# Patient Record
Sex: Male | Born: 1950
Health system: Southern US, Community
[De-identification: ages and names within clinical notes are randomized; demographics above are authoritative.]

## PROBLEM LIST (undated history)

## (undated) DIAGNOSIS — R001 Bradycardia, unspecified: Secondary | ICD-10-CM

## (undated) DIAGNOSIS — M1990 Unspecified osteoarthritis, unspecified site: Secondary | ICD-10-CM

## (undated) DIAGNOSIS — I2510 Atherosclerotic heart disease of native coronary artery without angina pectoris: Secondary | ICD-10-CM

## (undated) DIAGNOSIS — I10 Essential (primary) hypertension: Secondary | ICD-10-CM

## (undated) DIAGNOSIS — I440 Atrioventricular block, first degree: Secondary | ICD-10-CM

## (undated) DIAGNOSIS — I219 Acute myocardial infarction, unspecified: Secondary | ICD-10-CM

## (undated) DIAGNOSIS — N200 Calculus of kidney: Secondary | ICD-10-CM

## (undated) DIAGNOSIS — E785 Hyperlipidemia, unspecified: Secondary | ICD-10-CM

## (undated) DIAGNOSIS — N1831 Chronic kidney disease, stage 3a: Secondary | ICD-10-CM

## (undated) DIAGNOSIS — I519 Heart disease, unspecified: Secondary | ICD-10-CM

## (undated) HISTORY — DX: Acute myocardial infarction, unspecified: I21.9

## (undated) HISTORY — PX: EYE SURGERY: SHX253

## (undated) HISTORY — DX: Atrioventricular block, first degree: I44.0

## (undated) HISTORY — PX: FRACTURE SURGERY: SHX138

## (undated) HISTORY — DX: Calculus of kidney: N20.0

## (undated) HISTORY — DX: Chronic kidney disease, stage 3a: N18.31

## (undated) HISTORY — DX: Bradycardia, unspecified: R00.1

## (undated) HISTORY — PX: CATARACT EXTRACTION, BILATERAL: SHX1313

## (undated) HISTORY — DX: Unspecified osteoarthritis, unspecified site: M19.90

## (undated) HISTORY — DX: Hyperlipidemia, unspecified: E78.5

## (undated) HISTORY — DX: Atherosclerotic heart disease of native coronary artery without angina pectoris: I25.10

---

## 2005-04-11 ENCOUNTER — Ambulatory Visit: Payer: Self-pay | Admitting: Unknown Physician Specialty

## 2008-03-12 HISTORY — PX: CAROTID STENT: SHX1301

## 2008-05-06 ENCOUNTER — Ambulatory Visit: Payer: Self-pay | Admitting: Unknown Physician Specialty

## 2008-06-15 ENCOUNTER — Inpatient Hospital Stay (HOSPITAL_COMMUNITY): Admission: EM | Admit: 2008-06-15 | Discharge: 2008-06-18 | Payer: Self-pay | Admitting: Emergency Medicine

## 2008-06-15 ENCOUNTER — Ambulatory Visit: Payer: Self-pay | Admitting: *Deleted

## 2008-06-28 DIAGNOSIS — E785 Hyperlipidemia, unspecified: Secondary | ICD-10-CM

## 2008-06-28 DIAGNOSIS — I2510 Atherosclerotic heart disease of native coronary artery without angina pectoris: Secondary | ICD-10-CM | POA: Insufficient documentation

## 2008-06-28 DIAGNOSIS — Z87442 Personal history of urinary calculi: Secondary | ICD-10-CM

## 2008-06-29 ENCOUNTER — Encounter: Payer: Self-pay | Admitting: Cardiology

## 2008-06-29 ENCOUNTER — Ambulatory Visit: Payer: Self-pay | Admitting: Cardiology

## 2008-07-12 ENCOUNTER — Telehealth: Payer: Self-pay | Admitting: Cardiology

## 2008-07-19 ENCOUNTER — Ambulatory Visit: Payer: Self-pay | Admitting: Cardiology

## 2008-08-04 ENCOUNTER — Encounter: Payer: Self-pay | Admitting: Cardiology

## 2008-08-11 ENCOUNTER — Ambulatory Visit: Payer: Self-pay | Admitting: Cardiology

## 2008-08-13 LAB — CONVERTED CEMR LAB
Basophils Relative: 0.6 % (ref 0.0–3.0)
Chloride: 110 meq/L (ref 96–112)
Creatinine, Ser: 1.3 mg/dL (ref 0.4–1.5)
Eosinophils Relative: 8.2 % — ABNORMAL HIGH (ref 0.0–5.0)
GFR calc non Af Amer: 59.81 mL/min (ref >60)
HCT: 43 % (ref 39.0–52.0)
MCV: 97.5 fL (ref 78.0–100.0)
Monocytes Absolute: 0.3 10*3/uL (ref 0.1–1.0)
Monocytes Relative: 5.9 % (ref 3.0–12.0)
Neutrophils Relative %: 59.7 % (ref 43.0–77.0)
Platelets: 155 10*3/uL (ref 150.0–400.0)
Potassium: 4.4 meq/L (ref 3.5–5.1)
RBC: 4.41 M/uL (ref 4.22–5.81)
WBC: 4.8 10*3/uL (ref 4.5–10.5)

## 2008-10-15 ENCOUNTER — Encounter: Payer: Self-pay | Admitting: Cardiology

## 2008-10-18 ENCOUNTER — Ambulatory Visit: Payer: Self-pay | Admitting: Cardiology

## 2008-10-22 ENCOUNTER — Ambulatory Visit: Payer: Self-pay

## 2008-10-22 ENCOUNTER — Encounter: Payer: Self-pay | Admitting: Cardiology

## 2008-10-26 ENCOUNTER — Ambulatory Visit: Payer: Self-pay | Admitting: Cardiology

## 2008-10-27 ENCOUNTER — Encounter: Payer: Self-pay | Admitting: Cardiology

## 2008-10-29 ENCOUNTER — Encounter: Payer: Self-pay | Admitting: Cardiology

## 2008-11-03 ENCOUNTER — Encounter (INDEPENDENT_AMBULATORY_CARE_PROVIDER_SITE_OTHER): Payer: Self-pay | Admitting: *Deleted

## 2008-11-03 LAB — CONVERTED CEMR LAB
BUN: 18 mg/dL (ref 6–23)
CO2: 29 meq/L (ref 19–32)
Chloride: 110 meq/L (ref 96–112)
GFR calc non Af Amer: 54.87 mL/min (ref >60)
Glucose, Bld: 90 mg/dL (ref 70–99)
Potassium: 4.7 meq/L (ref 3.5–5.1)
Sodium: 143 meq/L (ref 135–145)

## 2008-11-08 ENCOUNTER — Encounter: Payer: Self-pay | Admitting: Cardiology

## 2009-02-11 ENCOUNTER — Encounter (INDEPENDENT_AMBULATORY_CARE_PROVIDER_SITE_OTHER): Payer: Self-pay | Admitting: *Deleted

## 2009-02-21 ENCOUNTER — Ambulatory Visit: Payer: Self-pay | Admitting: Cardiology

## 2009-02-21 ENCOUNTER — Encounter: Payer: Self-pay | Admitting: Cardiology

## 2009-04-15 ENCOUNTER — Ambulatory Visit: Payer: Self-pay | Admitting: Cardiology

## 2009-06-20 ENCOUNTER — Ambulatory Visit: Payer: Self-pay | Admitting: Cardiology

## 2009-10-10 ENCOUNTER — Ambulatory Visit: Payer: Self-pay | Admitting: Cardiology

## 2010-02-06 ENCOUNTER — Ambulatory Visit: Payer: Self-pay | Admitting: Cardiology

## 2010-04-11 NOTE — Letter (Signed)
Summary: [facility]  [facility]   Imported By: Kassie Mends 03/14/2009 12:20:23  _____________________________________________________________________  External Attachment:    Type:   Image     Comment:   External Document

## 2010-04-11 NOTE — Assessment & Plan Note (Signed)
Summary: f64m   Visit Type:  6 months follow up Primary Provider:  Dr Mila Merry  CC:  No complains.  History of Present Illness: Tpatient is 60 years old and return for management of CAD. In April 2010 he had a non-ST elevation MI treated with a DES to the LAD. His ejection fraction was initially 40% but this improved to 60%. He had a stress test in [place] where he was involved in a rehabilitation program last August and this was negative.  He has done quite well since then and has had no chest pain shortness of breath or palpitations.  He lives in [place] and works in [place]. 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 His primary care physician is Dr. Mila Merry with her normal clinic in [place].  He is dissipating in the improvement trial which randomizes patients to simvastatin versus Vytorin.  Current Medications (verified): 1)  Aspirin 325 Mg  Tabs (Aspirin) .Marland Kitchen.. 1 Tab Qd 2)  Improve It Study .... Take As Directed 3)  Plavix 75 Mg Tabs (Clopidogrel Bisulfate) .... Take One Tablet By Mouth Daily 4)  Carvedilol 6.25 Mg Tabs (Carvedilol) .... Take One Tablet By Mouth Twice A Day 5)  Nitroglycerin 0.4 Mg Subl (Nitroglycerin) .... One Tablet Under Tongue Every 5 Minutes As Needed For Chest Pain---May Repeat Times Three 6)  Pepcid 20 Mg Tabs (Famotidine) .Marland Kitchen.. 1 Tab Bid 7)  Viagra 25 Mg Tabs (Sildenafil Citrate) .... Take One Tab By Mouth 30 Minutes Prior To Activity As Needed  Allergies (verified): No Known Drug Allergies  Past History:  Past Medical History: Reviewed history from 06/28/2008 and no changes required.  Kidney stones.  NSTEMI 06/2008 Rx PCI LAD HL     Review of Systems       ROS is negative except as outlined in HPI.   Vital Signs:  Patient profile:   60 year old male Height:      73 inches Weight:      201.25 pounds BMI:     26.65 Pulse rate:   46 / minute Pulse rhythm:   irregularly  irregular Resp:     18 per minute BP sitting:   114 / 70  (left arm) Cuff size:   large  Vitals Entered By: Vikki Ports (April 15, 2009 12:03 PM)  Physical Exam  Additional Exam:  Gen. Well-nourished, in no distress   Neck: No JVD, thyroid not enlarged, no carotid bruits Lungs: No tachypnea, clear without rales, rhonchi or wheezes Cardiovascular: Rhythm regular, PMI not displaced,  heart sounds  normal, no murmurs or gallops, no peripheral edema, pulses normal in all 4 extremities. Abdomen: BS normal, abdomen soft and non-tender without masses or organomegaly, no hepatosplenomegaly. MS: No deformities, no cyanosis or clubbing   Neuro:  No focal sns   Skin:  no lesions    Impression & Recommendations:  Problem # 1:  CAD, NATIVE VESSEL (ICD-414.01) He had a non-ST elevation MI in April 2010 treated with a DES to the LAD. He is now stable without any cardiac symptoms. His EF has improved from 40-60%. We will continue current therapy. Since he had a myocardial infarction and since the stent is in the LAD, we will plan to continue the Plavix beyond the first year. His updated medication list for this problem includes:    Aspirin 325 Mg Tabs (Aspirin) .Marland Kitchen... 1 tab qd    Plavix 75 Mg Tabs (Clopidogrel  bisulfate) .Marland Kitchen... Take one tablet by mouth daily    Carvedilol 6.25 Mg Tabs (Carvedilol) .Marland Kitchen... Take one tablet by mouth twice a day    Nitroglycerin 0.4 Mg Subl (Nitroglycerin) ..... One tablet under tongue every 5 minutes as needed for chest pain---may repeat times three  Orders: EKG w/ Interpretation (93000)  Problem # 2:  HYPERLIPIDEMIA-MIXED (ICD-272.4) He is in the improve it  trial. They will follow his lipids.  Patient Instructions: 1)  Your physician wants you to follow-up in: 1 year with Dr. Earney Hamburg.  You will receive a reminder letter in the mail two months in advance. If you don't receive a letter, please call our office to schedule the follow-up appointment.

## 2010-04-13 ENCOUNTER — Encounter: Payer: Self-pay | Admitting: Cardiology

## 2010-04-13 ENCOUNTER — Ambulatory Visit: Admit: 2010-04-13 | Payer: Self-pay | Admitting: Cardiovascular Disease

## 2010-04-13 ENCOUNTER — Encounter: Payer: Self-pay | Admitting: Cardiovascular Disease

## 2010-04-13 ENCOUNTER — Ambulatory Visit (INDEPENDENT_AMBULATORY_CARE_PROVIDER_SITE_OTHER): Payer: BC Managed Care – PPO | Admitting: Cardiovascular Disease

## 2010-04-13 DIAGNOSIS — E7800 Pure hypercholesterolemia, unspecified: Secondary | ICD-10-CM

## 2010-04-13 DIAGNOSIS — I2510 Atherosclerotic heart disease of native coronary artery without angina pectoris: Secondary | ICD-10-CM

## 2010-04-19 NOTE — Assessment & Plan Note (Signed)
Summary: f 1y per checkout 04/15/09/Former Juanda Chance pt/ Hm per pt call / rm   Vital Signs:  Patient profile:   60 year old male Height:      73 inches Weight:      209.50 pounds BMI:     27.74 Pulse rate:   47 / minute Resp:     18 per minute BP sitting:   110 / 72  (left arm)  Vitals Entered By: Celestia Khat, CMA (April 13, 2010 9:23 AM)  Visit Type:  Follow-up Primary Provider:  Dr Mila Merry  CC:  pressure in chest.  pt has no other complaints today.Marland Kitchen  History of Present Illness: 60 yo male with h/o CAD, hyperlipidemia, He has been followed in the past by Dr. Juanda Chance. In April 2010 he had a non-ST elevation MI treated with a DES to the LAD. His ejection fraction was initially 40% but this improved to 60%. He had a stress test in [place] in August 2010 where he was involved in a rehabilitation program and this was negative. He is here today for routine yearly follow up. He is enrolled in the IMPROVE-IT study. He was seen by the study coordinators two months ago and had a change in meds and has felt SOB and dizzy since then. This has gotten better since then. No chest discomfort. He is on the treadmill every other day and has no problems. When he sits in front of the TV or sits down at work , he occasionally has mild chest pressure. This is only at rest but never with exertion. He also describes some short term memory loss and blurred vision since the study drug was altered.     Current Medications (verified): 1)  Aspirin 325 Mg  Tabs (Aspirin) .Marland Kitchen.. 1 Tab Qd 2)  Improve It Study .... Take As Directed 3)  Plavix 75 Mg Tabs (Clopidogrel Bisulfate) .... Take One Tablet By Mouth Daily 4)  Carvedilol 6.25 Mg Tabs (Carvedilol) .... Take One Tablet By Mouth Twice A Day 5)  Nitroglycerin 0.4 Mg Subl (Nitroglycerin) .... One Tablet Under Tongue Every 5 Minutes As Needed For Chest Pain---May Repeat Times Three 6)  Pepcid 20 Mg Tabs (Famotidine) .Marland Kitchen.. 1 Tab Bid 7)  Viagra 25 Mg Tabs  (Sildenafil Citrate) .... Take One Tab By Mouth 30 Minutes Prior To Activity As Needed  Allergies (verified): No Known Drug Allergies  Past History:  Past Medical History: Kidney stones NSTEMI 06/2008 Rx PCI LAD Hyperlipidemia     Past Surgical History: None  Family History: Reviewed history from 06/28/2008 and no changes required.  His mother is alive 75 and healthy. His father died of a  brain aneurysm at the age of 59. 2 brothers-one with DM (overweight) 1 sister-alive and well  Social History: Reviewed history from 06/28/2008 and no changes required. He lives in [place] with his wife. 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 He has never smoked.  Occasional alcohol.   No drugs.      Review of Systems       The patient complains of shortness of breath and dizziness.  The patient denies fatigue, malaise, fever, weight gain/loss, vision loss, decreased hearing, hoarseness, chest pain, palpitations, prolonged cough, wheezing, sleep apnea, coughing up blood, abdominal pain, blood in stool, nausea, vomiting, diarrhea, heartburn, incontinence, blood in urine, muscle weakness, joint pain, leg swelling, rash, skin lesions, headache, fainting, depression, anxiety, enlarged lymph nodes, easy bruising or bleeding,  and environmental allergies.    Physical Exam  General:  General: Well developed, well nourished, NAD HEENT: OP clear, mucus membranes moist SKIN: warm, dry Neuro: No focal deficits Musculoskeletal: Muscle strength 5/5 all ext Psychiatric: Mood and affect normal Neck: No JVD, no carotid bruits, no thyromegaly, no lymphadenopathy. Lungs:Clear bilaterally, no wheezes, rhonci, crackles CV: RRR no murmurs, gallops rubs Abdomen: soft, NT, ND, BS present Extremities: No edema, pulses 2+.    Impression & Recommendations:  Problem # 1:  CAD, NATIVE VESSEL (ICD-414.01) Stable. Continue ASA/Plavix and Coreg. He is on a statin as part of  the IMPROVE IT protocol.  Decrease ASA to 81 mg by mouth Qdaily.   His updated medication list for this problem includes:    Aspirin 81 Mg Tbec (Aspirin) .Marland Kitchen... Take one tablet by mouth daily    Plavix 75 Mg Tabs (Clopidogrel bisulfate) .Marland Kitchen... Take one tablet by mouth daily    Carvedilol 6.25 Mg Tabs (Carvedilol) .Marland Kitchen... Take one tablet by mouth twice a day    Nitroglycerin 0.4 Mg Subl (Nitroglycerin) ..... One tablet under tongue every 5 minutes as needed for chest pain---may repeat times three  Orders: EKG w/ Interpretation (93000)  Problem # 2:  HYPERLIPIDEMIA-MIXED (ICD-272.4) Continue study drug. Lipids per study protocol.   Patient Instructions: 1)  Your physician recommends that you schedule a follow-up appointment in: 1 year 2)  Your physician has recommended you make the following change in your medication: Decrease aspirin to 81 mg by mouth daily. Prescriptions: VIAGRA 50 MG TABS (SILDENAFIL CITRATE) Take one as directed as needed  #5 x 4   Entered by:   Dossie Arbour, RN, BSN   Authorized by:   Verne Carrow, MD   Signed by:   Dossie Arbour, RN, BSN on 04/13/2010   Method used:   Electronically to        [facility] [phone] (retail)       [address]       [place], [place]  [address]       Ph: [phone] or [phone]       Fax: [phone]   RxID:   [id number]    Orders Added: 1)  EKG w/ Interpretation [93000]     EKG  Procedure date:  04/13/2010  Findings:      Sinus brady, 47 bpm.

## 2010-06-19 ENCOUNTER — Encounter (INDEPENDENT_AMBULATORY_CARE_PROVIDER_SITE_OTHER): Payer: BC Managed Care – PPO

## 2010-06-19 ENCOUNTER — Encounter: Payer: Self-pay | Admitting: Cardiovascular Disease

## 2010-06-19 DIAGNOSIS — R0989 Other specified symptoms and signs involving the circulatory and respiratory systems: Secondary | ICD-10-CM

## 2010-06-21 LAB — LIPID PANEL
Cholesterol: 137 mg/dL (ref 0–200)
HDL: 35 mg/dL — ABNORMAL LOW (ref >39)
LDL Cholesterol: 90 mg/dL (ref 0–99)
Total CHOL/HDL Ratio: 3.9 RATIO
Triglycerides: 62 mg/dL (ref <150)
VLDL: 12 mg/dL (ref 0–40)

## 2010-06-21 LAB — CK TOTAL AND CKMB (NOT AT ARMC)
CK, MB: 123.9 ng/mL — ABNORMAL HIGH (ref 0.3–4.0)
CK, MB: 5.3 ng/mL — ABNORMAL HIGH (ref 0.3–4.0)
Relative Index: 10.7 — ABNORMAL HIGH (ref 0.0–2.5)
Total CK: 1154 U/L — ABNORMAL HIGH (ref 7–232)
Total CK: 438 U/L — ABNORMAL HIGH (ref 7–232)

## 2010-06-21 LAB — CBC
HCT: 37 % — ABNORMAL LOW (ref 39.0–52.0)
HCT: 39.2 % (ref 39.0–52.0)
HCT: 40.7 % (ref 39.0–52.0)
Hemoglobin: 13 g/dL (ref 13.0–17.0)
Hemoglobin: 13.9 g/dL (ref 13.0–17.0)
Hemoglobin: 14.3 g/dL (ref 13.0–17.0)
MCHC: 34.9 g/dL (ref 30.0–36.0)
MCHC: 35.2 g/dL (ref 30.0–36.0)
MCHC: 35.2 g/dL (ref 30.0–36.0)
MCHC: 35.6 g/dL (ref 30.0–36.0)
MCV: 94.5 fL (ref 78.0–100.0)
MCV: 95.6 fL (ref 78.0–100.0)
MCV: 96 fL (ref 78.0–100.0)
MCV: 96.4 fL (ref 78.0–100.0)
Platelets: 144 10*3/uL — ABNORMAL LOW (ref 150–400)
Platelets: 158 10*3/uL (ref 150–400)
RBC: 3.85 MIL/uL — ABNORMAL LOW (ref 4.22–5.81)
RBC: 3.99 MIL/uL — ABNORMAL LOW (ref 4.22–5.81)
RBC: 4.1 MIL/uL — ABNORMAL LOW (ref 4.22–5.81)
RBC: 4.31 MIL/uL (ref 4.22–5.81)
RDW: 13.1 % (ref 11.5–15.5)
RDW: 13.1 % (ref 11.5–15.5)
RDW: 13.1 % (ref 11.5–15.5)
WBC: 6.5 10*3/uL (ref 4.0–10.5)

## 2010-06-21 LAB — DIFFERENTIAL
Basophils Absolute: 0 10*3/uL (ref 0.0–0.1)
Basophils Relative: 1 % (ref 0–1)
Eosinophils Absolute: 0.3 10*3/uL (ref 0.0–0.7)
Eosinophils Relative: 6 % — ABNORMAL HIGH (ref 0–5)
Lymphocytes Relative: 26 % (ref 12–46)
Lymphs Abs: 1.4 10*3/uL (ref 0.7–4.0)
Monocytes Absolute: 0.4 10*3/uL (ref 0.1–1.0)
Monocytes Relative: 8 % (ref 3–12)
Neutro Abs: 3 10*3/uL (ref 1.7–7.7)
Neutrophils Relative %: 59 % (ref 43–77)

## 2010-06-21 LAB — CARDIAC PANEL(CRET KIN+CKTOT+MB+TROPI)
CK, MB: 112.4 ng/mL — ABNORMAL HIGH (ref 0.3–4.0)
Relative Index: 12 — ABNORMAL HIGH (ref 0.0–2.5)
Total CK: 1376 U/L — ABNORMAL HIGH (ref 7–232)
Total CK: 935 U/L — ABNORMAL HIGH (ref 7–232)
Troponin I: 17.8 ng/mL (ref 0.00–0.06)
Troponin I: 5.71 ng/mL (ref 0.00–0.06)

## 2010-06-21 LAB — PROTIME-INR
INR: 1 (ref 0.00–1.49)
Prothrombin Time: 13.9 seconds (ref 11.6–15.2)

## 2010-06-21 LAB — HEPARIN LEVEL (UNFRACTIONATED): Heparin Unfractionated: 1.01 IU/mL — ABNORMAL HIGH (ref 0.30–0.70)

## 2010-06-21 LAB — BASIC METABOLIC PANEL
BUN: 15 mg/dL (ref 6–23)
CO2: 25 mEq/L (ref 19–32)
CO2: 25 mEq/L (ref 19–32)
Calcium: 8.4 mg/dL (ref 8.4–10.5)
Chloride: 107 mEq/L (ref 96–112)
Chloride: 108 mEq/L (ref 96–112)
Creatinine, Ser: 1.27 mg/dL (ref 0.4–1.5)
GFR calc Af Amer: >60 mL/min (ref >60)
GFR calc Af Amer: >60 mL/min (ref >60)
Glucose, Bld: 104 mg/dL — ABNORMAL HIGH (ref 70–99)
Potassium: 4 mEq/L (ref 3.5–5.1)
Sodium: 140 mEq/L (ref 135–145)

## 2010-06-21 LAB — POCT I-STAT, CHEM 8
BUN: 20 mg/dL (ref 6–23)
Calcium, Ion: 1.16 mmol/L (ref 1.12–1.32)
Chloride: 108 mEq/L (ref 96–112)
Creatinine, Ser: 1.5 mg/dL (ref 0.4–1.5)
Glucose, Bld: 119 mg/dL — ABNORMAL HIGH (ref 70–99)
HCT: 41 % (ref 39.0–52.0)
Hemoglobin: 13.9 g/dL (ref 13.0–17.0)
Potassium: 4 mEq/L (ref 3.5–5.1)
Sodium: 142 mEq/L (ref 135–145)
TCO2: 24 mmol/L (ref 0–100)

## 2010-06-21 LAB — POCT CARDIAC MARKERS
CKMB, poc: 1.5 ng/mL (ref 1.0–8.0)
Myoglobin, poc: 159 ng/mL (ref 12–200)
Troponin i, poc: <0.05 ng/mL (ref 0.00–0.09)

## 2010-06-21 LAB — TROPONIN I
Troponin I: 0.09 ng/mL — ABNORMAL HIGH (ref 0.00–0.06)
Troponin I: 16.1 ng/mL (ref 0.00–0.06)

## 2010-06-21 LAB — APTT: aPTT: 30 seconds (ref 24–37)

## 2010-07-19 ENCOUNTER — Telehealth: Payer: Self-pay | Admitting: Cardiovascular Disease

## 2010-07-19 DIAGNOSIS — E875 Hyperkalemia: Secondary | ICD-10-CM

## 2010-07-19 NOTE — Telephone Encounter (Signed)
Patient has been eating a banana everyday so I asked him to stop eating bananas and to come back Monday to recheck his potassium level. He is currently not taking a potassium supplement. Forwarded to Dr. Clifton James to review.

## 2010-07-19 NOTE — Telephone Encounter (Signed)
Pt rtn call to get lab results °

## 2010-07-24 ENCOUNTER — Other Ambulatory Visit (INDEPENDENT_AMBULATORY_CARE_PROVIDER_SITE_OTHER): Payer: BC Managed Care – PPO | Admitting: *Deleted

## 2010-07-24 DIAGNOSIS — E875 Hyperkalemia: Secondary | ICD-10-CM

## 2010-07-24 LAB — BASIC METABOLIC PANEL
Calcium: 8.7 mg/dL (ref 8.4–10.5)
GFR: 69.86 mL/min (ref >60.00)
Glucose, Bld: 76 mg/dL (ref 70–99)
Sodium: 136 mEq/L (ref 135–145)

## 2010-07-25 NOTE — H&P (Signed)
Jonathan Sharp, Jonathan Sharp NO.:  [id number]   MEDICAL RECORD NO.:  [id number]          PATIENT TYPE:  EMS   LOCATION:  MAJO                         FACILITY:  [facility]   PHYSICIAN:  Unice Cobble, MD     DATE OF BIRTH:  [birth year]   DATE OF ADMISSION:  06/15/2008  DATE OF DISCHARGE:                              HISTORY & PHYSICAL   CHIEF COMPLAINTS:  Chest pain.   HISTORY OF PRESENT ILLNESS:  This is a 60 year old white male with  history of kidney stones, but otherwise healthy, who presents with chest  pain.  The patient was eating dinner in his recliner tonight when he had  sudden onset of chest pain.  He described the chest pain as a  tightness/squeezing/heaviness in his substernal area which radiated to  his neck and jaw and his left axilla.  He did have some diaphoresis and  some nausea, but no shortness of breath.  It was 9 out of 10 at its  peak.  The pain was relieved in the emergency department by  nitroglycerin drip and morphine of 6 mg.  Until this episode tonight,  the patient has not had any chest pain whatsoever.  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   PAST MEDICAL HISTORY:  Kidney stones.   ALLERGIES:  No known drug allergies.   MEDICATIONS:  1. Multivitamin.  2. Calcium and vitamin D.  3. Coenzyme Q.  4. Flaxseed oil.   SOCIAL HISTORY:  He lives in [place] with his wife.  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  He has never smoked.  Occasional alcohol.  No drugs.   FAMILY HISTORY:  His mother is 49 and healthy.  His father died of a  brain aneurysm at the age of 59.  His siblings are okay.   REVIEW OF SYSTEMS:  Complete review of systems done and found to be  otherwise negative, except as stated in the HPI.   PHYSICAL EXAMINATION:  He is afebrile with a pulse of 69, respiratory  rate is 16, blood pressure is 161/69.   His O2 saturations are 99% on 2  liters.  GENERAL:  He is in no acute distress.  He is overweight.  HEENT:  Shows NCAT, PERRLA, EOMI, MMM, oropharynx without erythema or  exudates.  Poor dentition.  NECK:  Supple without lymphadenopathy, thyromegaly, bruits or jugular  venous distention.  HEART:  Has a regular rate and rhythm with a normal  S1 and S2.  No murmurs, gallops or rubs.  PMI is nondisplaced.  Pulses  are 2+ and equal bilaterally without bruits.  LUNGS:  Clear to auscultation bilaterally.  SKIN:  Has no rashes or lesions.  ABDOMEN:  Soft and nontender, with normal bowel sounds.  No rebound or  guarding.  EXTREMITIES:  Show no cyanosis, clubbing or edema.  MUSCULOSKELETAL:  Shows no joint deformity, effusions or CVA tenderness.  NEUROLOGICAL:  He is alert and  oriented x3, with cranial nerves II-XII  intact.  Strength is 5/5 om all extremities and axial groups.  Normal  sensation throughout.   RADIOLOGY:  Chest x-ray is normal.  He has a noncontrast head CT which  shows no acute bleed or stroke.  CT of the chest was also done, which  shows no acute abnormality of the ascending and descending aorta.  EKG  shows a rate of 60 in normal sinus rhythm.  He has concerning  inferolateral T-wave depression of approximately 0.5 mm.   LABS:  Show a normal white count, with a hemoglobin of 13.9.  His  creatinine is 1.5 and his glucose is 119.  CK-MB and troponin are  negative at this time.   ASSESSMENT AND PLAN:  This is a 60 year old white male with chest pain  concerning for non-ST segment myocardial infarction.  The patient will  be anticoagulated, given a beta blocker and aspirin, and his  nitroglycerin drip will be continued.  Statin will also be administered.  If his troponin is positive, he will receive a catheterization in the  morning.  If it is negative, I will consider stress testing.  He will be  hydrated due to his creatinine of 1.5, and considering his dye load  tonight  and possible dye load tomorrow; Creatinine will be rechecked in  the morning.  GI prophylaxis will be given.  Of note, there was a  concern by the ER physician of a downsloping left facial droop.  The  family had not noticed anything until it was mentioned.  When I  interviewed the patient I did not notice any discernible facial droop,  and the family said that it had resolved.  I am unsure whether this was  due to a TIA-like syndrome or possibly muscle spasm secondary to  grimacing in pain, which he was apparently doing upon arrival at the  hospital.  This will be followed overnight prior to any catheterization  procedure.      Unice Cobble, MD  Electronically Signed     ACJ/MEDQ  D:  06/15/2008  T:  06/15/2008  Job:  [id number]

## 2010-07-25 NOTE — Cardiovascular Report (Signed)
Jonathan Sharp, Jonathan Sharp                  ACCOUNT NO.:  [id number]   MEDICAL RECORD NO.:  [id number]          PATIENT TYPE:  INP   LOCATION:  2927                         FACILITY:  [facility]   PHYSICIAN:  Jonathan R. Juanda Chance, MD, FACCDATE OF BIRTH:  [birth year]   DATE OF PROCEDURE:  06/16/2008  DATE OF DISCHARGE:                            CARDIAC CATHETERIZATION   CLINICAL HISTORY:  Jonathan Sharp is 60 year old and has no prior history of  known heart disease and has no history of hypertension, diabetes, or  hyperlipidemia that is known.  He had the onset of chest pain yesterday  and was admitted last night with chest pain and abnormal ECG, which  suggested anterior Q waves, but there was no acute ST elevation.  His  enzymes returned positive and he was having mild pain this morning and  was seen by Dr. Riley Kill who arranged for urgent catheterization.  He was  started on Integrilin.   PROCEDURE:  The procedure was followed by femoral artery using arterial  sheath, and initially 5-French preformed coronary catheters.  After  completion of the diagnostic study, we made decision to use intervention  on the totally occluded left anterior descending.   The patient had been given Integrilin and this was converted to a double  bolus Integrilin and he had been given heparin, and ACT was 245.  We  gave 600 mg of Plavix and 20 mg of Pepcid.  We used an XB LAD 4 guiding  catheter with side holes.  We passed a Prowater wire across the totally  occluded LAD without difficulty.  This did not reestablish flow and we  dotted the lesion, which established some flow.  We did not see much  thrombus, so we dilated with a 2.5- x 20-mm Apex balloon performing 2  inflations up to 10 atmospheres for 30 seconds.  This reestablished  flow, but we then were able to see visible thrombus.  We then went in  with a Fetch catheter and performed 2 runs with aspiration and we also  gave verapamil through the Fetch catheter.   This  reestablished brisk TIMI III flow.  We then deployed 2 overlapping  Xience stents.  The first stent was positioned in the midvessel and was  a 2.75 x 28 mm stent, which we deployed at 14 atmospheres.  We then  passed a second 3.0- x 23-mm Xience stent just overlapping and proximal  to the first stent and overlapping a moderate-sized first diagonal  branch.  We deployed this with one inflation of 14 atmospheres for 30  seconds.  We then postdilated the distal stent and the overlap with a  3.25- x 12-mm Quantum Maverick performing 2 inflation up to 16  atmospheres for 30 seconds.  We then postdilated the proximal stent and  the midportion where there was overlap with a 3.5- x 20-mm [place] Voyager  performing 2 inflations up to 15 atmospheres for 30 seconds.  We then  gave additional verapamil down the LAD and performed final diagnostic  studies.  The patient tolerated the procedure well.  He develop a  hematoma in the right femoral groin during the procedure.  The right  groin was closed with Angio-Seal at the end the procedure.   RESULTS:  The left main coronary artery:  The left main coronary artery  is free of significant disease.   Left anterior descending artery:  Please note left descending artery is  completely occluded after first diagonal branch.  After opening the  vessel, there was segmental disease from just before the diagonal branch  to down to the midvessel.   Left circumflex artery:  The left circumflex artery gave rise to a small  ramus branch, a small marginal branch, a large marginal branch, and a  posterolateral branch.  There was 6% narrowing in the mid circumflex.   The right coronary artery:  The right coronary artery is a small-to-  moderate-sized vessel gave rise to a conus branch, a right ventricle  branch, and a short posterior descending and 2 posterolateral branches.  There was 30% narrowing in the midvessel.   There were fairly well-developed collaterals from  the right coronary  artery to the LAD.   The left ventriculogram:  Please note the left ventriculogram performed  in the RAO projection showed hypokinesis of the anterolateral wall and  hypokinesis of the inferoapical wall.  The estimated ejection fraction  was 40%.   Following placement of tandem overlapping Xience stents in the proximal  and mid LAD.  His stenosis improved from 100% to 0% and the flow  improved from TIMI 0 to TIMI III flow.   CONCLUSION:  1. Non-ST elevation anterior wall myocardial infarction with total      occlusion of the proximal left anterior descending artery with      collateral flow from the right, 6% narrowing in the mid circumflex      artery, and 30% narrowing in the mid-right coronary with      anterolateral wall and inferoapical wall hypokinesis, and an      estimated fraction of 40%.  2. Successful percutaneous coronary intervention of the totally      obstructed proximal left anterior descending using Fetch aspiration      and tandem overlapping Xience stent with an improvement in center      narrowing from 100% to 0%, improvement of flow from TIMI 0 to TIMI      III flow.   DISPOSITION:  The patient returned to [facility] room for further  observation.  We will stop the Integrilin because of continued oozing in  the right femoral area.  I am hopeful that his LV function will recover  well since he had collaterals, and since there is some motion on the  left ventriculogram today.  We will begin ACE inhibitors and continue  beta-blockers.      Jonathan Elvera Lennox Juanda Chance, MD, [facility]  Electronically Signed     BRB/MEDQ  D:  06/16/2008  T:  06/16/2008  Job:  [id number]   cc:   Arturo Morton. Riley Kill, MD, [facility]  Jonathan R. Juanda Chance, MD, [facility]

## 2010-07-25 NOTE — Discharge Summary (Signed)
NAMEPJ, ZEHNER NO.:  [id number]   MEDICAL RECORD NO.:  [id number]          PATIENT TYPE:  INP   LOCATION:  2927                         FACILITY:  [facility]   PHYSICIAN:  Verne Carrow, MDDATE OF BIRTH:  11-21-1950   DATE OF ADMISSION:  06/15/2008  DATE OF DISCHARGE:  06/18/2008                               DISCHARGE SUMMARY   PRIMARY CARDIOLOGIST:  Everardo Beals. Juanda Chance, MD, [facility]   DISCHARGE DIAGNOSES:  1. Non-ST-elevation myocardial infarction status post 2 times drug-      eluting stents to the proximal left anterior descending.  2. Paroxysmal atrial fibrillation (very brief, back in sinus).   SECONDARY DIAGNOSES:  History of nephrolithiasis.   ALLERGIES:  NKDA.   PROCEDURES PERFORMED DURING THIS HOSPITALIZATION:  1. The patient had an EKG performed on June 15, 2008 that showed sinus      rhythm with a rate of 60 bpm and concerning for inferolateral T-      wave depression approximately 0.5 mm.  2. On June 15, 2008, the patient had chest x-ray that showed no active      cardiopulmonary disease.  3. On June 15, 2008, the patient had a CT of the head without contrast      that showed no intracranial hemorrhage or CT evidence of large      acute infarct.  Abnormality left frontal calvarium extending to the      left orbital region most suggestive of fibrous dysplasia, stability      can be confirmed on followup.  4. The patient had a CT angio of the chest that showed no acute      cardiopulmonary abnormalities, no evidence of aortic dissection,      small pulmonary nodules.  If the patient is a smoker, has a risk      factors for bronchogenic carcinoma.  Followup CT in 1 year is      recommended.  5. The patient had a CT of the abdomen that showed no acute upper      abdominal CT findings and no evidence of aortic dissection.  Liver      hypodensities are too small to characterize, bilateral renal      calculi without evidence for obstructive  uropathy, lumbar      spondylosis, subcentimeter hypoattenuating focus within the left      hepatic lobe, nonspecific and too small to characterize.  6. The patient had a CT angio of the pelvis that showed no acute      findings, no evidence for dissection.  7. The patient had a cardiac catheterization on June 16, 2008 that      revealed:      a.     Total occlusion of the proximal left anterior descending       artery with collateral flow from the right, 6% narrowing in the       mid circumflex artery, and 30% narrowing in the mid right coronary       artery with anterolateral wall and inferoapical wall hypokinesis,       an  estimated all ejection fraction of 40%.  At      b.     Successful percutaneous coronary intervention of the       proximal left anterior descending using Fetch aspiration and       tandem overlapping Xience stents with an improvement in center       narrowing from 100% to 0%, and improvement of flow from TIMI 0 to       TIMI 3 flow.  8. The patient had an EKG performed on June 17, 2008 that showed sinus      rhythm with a rate of 62 bpm with minimal T-wave inversion in lead      V2 and aVL.  Otherwise, T-wave flattening in V3-V6 and lead I.  No      significant Q-waves, normal axis, and no evidence of hypertrophy.  9. The patient had an EKG performed on June 18, 2008 that showed sinus      bradycardia with a rate of 56 bpm.  Otherwise, no significant      changes from tracing completed on June 17, 2008.   HISTORY OF PRESENT ILLNESS:  This is a 60 year old white male with a  history of kidney stones, but otherwise healthy who presented with chest  pain.  The patient was eating dinner in his recliner on the evening of  June 15, 2008 when he experienced sudden onset of chest pain.  He  describes pain as a tightness/squeezing/heaviness in the substernal area  which radiated to his neck and jaw and to his left axilla.  He had some  diaphoresis and some nausea but no  shortness of breath.  Pain was 9/10  in severity at its peak.  Pain was relieved in the emergency department  by IV nitroglycerin and 6 mg of morphine.  Until this episode, the  patient has not had any chest pain.  In fact, he was very active over  the weekend roughing a structure over his pool and was going up and down  the ladder without any symptoms.   HOSPITAL COURSE:  The patient was admitted and underwent procedures as  described above.  He tolerated them well without any significant  complications.  The one exceptional would be some minimal oozing at the  cath site, post cath which resulted in discontinuation of Integrillin  early.  The patient was noted to have a very brief episode of atrial  fibrillation post cath on arriving in the Intensive Care Unit.  This was  only 13 beats of AFib with a ventricular response rate in the 90s.  Note, the patient spontaneously converted back to sinus and did not have  any further episodes of AFib.  The patient began cardiac rehab on June 18, 2008 and was able to ambulate easily without significant symptoms.  On June 18, 2008, the patient was evaluated by Dr. Clifton James and deemed  stable for discharge if as long as he was able to complete cardiac rehab  later that afternoon without significant onset of symptoms.  The patient  did complete cardiac rehab and was able to ambulate 350 feet with  minimal assistance and no symptoms.  Note, the patient's vital signs had  remained stable since his cardiac catheterization.  Prior to being  discharged, the patient was enrolled in the IMPROVE IT clinical trial.  The patient had the risks and benefits of participation in the trial.  I  explained to him prior to signing the informed consent form  and no study  related procedures were completed prior to the patient signing and  receiving a copy of the informed consent form.  The patient has been  scheduled to follow up with Dr. Juanda Chance on June 29, 2008 at 1  p.m.  The  patient will be given a list of his medications, all his prescriptions,  his followup instructions, and his post cath instructions, and upon his  discharge, he should have no questions or concerns other than not  addressed.   DISCHARGE LABORATORY DATA:  WBC 6.1, HGB 13.4, HCT 38.5, and PLT count  144.  Sodium 140, potassium 4.0, chloride 108, CO2 25, BUN 11,  creatinine 1.24, glucose 104, and calcium 8.6.  Notes, cardiac enzymes  down trending last set, CK 1154, CK-MB 123.9, and troponin-I 16.10.  Total cholesterol 137, triglycerides 62, HDL 35, LDL 90, and VLDL 12.   FOLLOWUP PLANS AND APPOINTMENTS:  Please see hospital course.   DISCHARGE MEDICATIONS:  1. IMPROVE IT research study drug (Vytorin versus Zocor).  2. Enteric-coated aspirin 325 mg p.o. daily.  3. Pepcid 20 mg p.o. b.i.d.  4. Plavix 75 mg p.o. daily.  5. Coreg 12.5 mg p.o. b.i.d.  6. Crestor 40 mg p.o. daily.  7. Enalapril 2.5 mg p.o. b.i.d.  8. Nitroglycerin 0.4 mg sublingual p.r.n. chest pain.   DURATION OF DISCHARGE ENCOUNTER:  45 minutes including physician time.      Jarrett Ables, [facility]      Verne Carrow, MD  Electronically Signed    MS/MEDQ  D:  06/18/2008  T:  06/19/2008  Job:  [id number]   cc:   Everardo Beals. Juanda Chance, MD, [facility]

## 2010-09-22 ENCOUNTER — Other Ambulatory Visit: Payer: Self-pay | Admitting: Internal Medicine

## 2010-10-02 ENCOUNTER — Telehealth: Payer: Self-pay | Admitting: Cardiovascular Disease

## 2010-10-02 NOTE — Telephone Encounter (Signed)
Per pt call,  Pt needs carvedalol 6.25 RX  (heart medicine) refill [facility] [place]. Pt said Pharmacy has called 2x to get permission to refill RX.

## 2010-10-02 NOTE — Telephone Encounter (Signed)
Pt calling back , needs refill today, pt out of med, said pharmacy requested auth last week, now has run out

## 2010-10-04 NOTE — Telephone Encounter (Signed)
Per pt call, pt said he has been waiting for one week for a RX refill. Pt has been out of medicine for "quite awhile".

## 2010-10-04 NOTE — Telephone Encounter (Signed)
pt. 

## 2010-10-05 MED ORDER — CARVEDILOL 6.25 MG PO TABS: 6.2500 mg | ORAL_TABLET | Freq: Two times a day (BID) | ORAL | Status: DC

## 2010-10-05 NOTE — Telephone Encounter (Signed)
SPOKE WITH PT GAVE PT REFILS

## 2011-01-10 ENCOUNTER — Telehealth: Payer: Self-pay | Admitting: Cardiovascular Disease

## 2011-01-10 NOTE — Telephone Encounter (Signed)
Refill of plavix generic 75 mg, uses [facility] [place]

## 2011-01-15 MED ORDER — CLOPIDOGREL BISULFATE 75 MG PO TABS: 75.0000 mg | ORAL_TABLET | Freq: Every day | ORAL | Status: DC

## 2011-04-13 ENCOUNTER — Encounter: Payer: Self-pay | Admitting: Cardiovascular Disease

## 2011-04-13 ENCOUNTER — Ambulatory Visit (INDEPENDENT_AMBULATORY_CARE_PROVIDER_SITE_OTHER): Payer: BC Managed Care – PPO | Admitting: Cardiovascular Disease

## 2011-04-13 VITALS — BP 118/80 | HR 51 | Ht 73.0 in | Wt 225.0 lb

## 2011-04-13 DIAGNOSIS — I2510 Atherosclerotic heart disease of native coronary artery without angina pectoris: Secondary | ICD-10-CM

## 2011-04-13 DIAGNOSIS — E785 Hyperlipidemia, unspecified: Secondary | ICD-10-CM

## 2011-04-13 MED ORDER — FAMOTIDINE 20 MG PO TABS: 20.0000 mg | ORAL_TABLET | Freq: Two times a day (BID) | ORAL | Status: DC

## 2011-04-13 NOTE — Patient Instructions (Signed)
Your physician wants you to follow-up in: 12 months. You will receive a reminder letter in the mail two months in advance. If you don't receive a letter, please call our office to schedule the follow-up appointment.  Your physician recommends that you continue on your current medications as directed. Please refer to the Current Medication list given to you today.   Research dept will be in touch with you to schedule an appt in April when schedule for that time is available.  Study lab work will be done at that time.

## 2011-04-13 NOTE — Progress Notes (Signed)
History of Present Illness: 61 yo male with h/o CAD, hyperlipidemia here today for cardiac follow up. He has been followed in the past by Dr. Juanda Chance. In April 2010 he had a non-ST elevation MI treated with a DES to the LAD. His ejection fraction was initially 40% but this improved to 60%. He had a stress test in [place] in August 2010 where he was involved in a rehabilitation program and this was negative. He is here today for routine yearly follow up. I last saw him in February 2012. He is enrolled in the IMPROVE-IT study.   He is here today for follow up.  No chest discomfort or SOB. He has not been exercising every day. He occasionally feels a skipped beat. This lasts for ten seconds and happens once per month. He does not feel dizzy when this happens.   Primary care is Dr. Sherrie Mustache in [facility]. His lipids are followed through the research study but are not in EPIC.    Past Medical History  Diagnosis Date  . Kidney stones   . NSTEMI (non-ST elevated myocardial infarction)     06/2008  Rx PCI LAD  . Hyperlipidemia     Past Surgical History  Procedure Date  . None     Current Outpatient Prescriptions  Medication Sig Dispense Refill  . aspirin 325 MG tablet Take 81 mg by mouth daily.       . carvedilol (COREG) 6.25 MG tablet Take 1 tablet (6.25 mg total) by mouth 2 (two) times daily with a meal.  60 tablet  12  . clopidogrel (PLAVIX) 75 MG tablet Take 1 tablet (75 mg total) by mouth daily.  30 tablet  11  . famotidine (PEPCID) 20 MG tablet Take 20 mg by mouth 2 (two) times daily.      . nitroGLYCERIN (NITROSTAT) 0.4 MG SL tablet Place 0.4 mg under the tongue every 5 (five) minutes as needed.      . sildenafil (VIAGRA) 25 MG tablet daily as needed. Take one tab by mouth 30 min prior to activity as needed      . STUDY MEDICATION IMPROVE IT STUDY DRUG  Take as directed        Allergies not on file  History   Social History  . Marital Status: Married    Spouse  Name: N/A    Number of Children: N/A  . Years of Education: N/A   Occupational History  . Not on file.   Social History Main Topics  . Smoking status: Not on file  . Smokeless tobacco: Not on file  . Alcohol Use:   . Drug Use:   . Sexually Active:    Other Topics Concern  . 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 Aneurysm Father     brain  age 54  . Diabetes Brother     over weight    Review of Systems:  As stated in the HPI and otherwise negative.   BP 118/80  Pulse 51  Ht 6\' 1"  (1.854 m)  Wt 225 lb (102.059 kg)  BMI 29.69 kg/m2  Physical Examination: General: Well developed, well nourished, NAD HEENT: OP clear, mucus membranes moist SKIN: warm, dry. No rashes. Neuro: No focal deficits Musculoskeletal: Muscle strength 5/5 all ext  Psychiatric: Mood and affect normal Neck: No JVD, no carotid bruits, no thyromegaly, no lymphadenopathy. Lungs:Clear bilaterally, no wheezes, rhonci, crackles Cardiovascular: Regular rate and rhythm. No murmurs, gallops or rubs. Abdomen:Soft. Bowel sounds present. Non-tender.  Extremities: No lower extremity edema. Pulses are 2 + in the bilateral DP/PT.  EKG: Sinus brady, rate 51 bpm. 1st degree AV block. Poor R wave progression through the precordial leads.

## 2011-04-13 NOTE — Assessment & Plan Note (Signed)
Stable.  No chest pain. He is on dual antiplatelet therapy with ASA and Plavix. Tolerating beta blocker.

## 2011-04-13 NOTE — Assessment & Plan Note (Signed)
He is in the IMPROVE IT trial. Will get labs from research.

## 2011-04-20 NOTE — Progress Notes (Signed)
Addended by: Judithe Modest D on: 04/20/2011 02:38 PM   Modules accepted: Orders

## 2011-07-05 ENCOUNTER — Encounter (INDEPENDENT_AMBULATORY_CARE_PROVIDER_SITE_OTHER): Payer: BC Managed Care – PPO

## 2011-07-05 DIAGNOSIS — R0989 Other specified symptoms and signs involving the circulatory and respiratory systems: Secondary | ICD-10-CM

## 2011-10-22 ENCOUNTER — Other Ambulatory Visit: Payer: Self-pay | Admitting: Cardiovascular Disease

## 2011-12-27 ENCOUNTER — Other Ambulatory Visit: Payer: Self-pay | Admitting: Cardiology

## 2012-04-15 ENCOUNTER — Ambulatory Visit: Payer: BC Managed Care – PPO | Admitting: Cardiovascular Disease

## 2012-04-16 ENCOUNTER — Encounter: Payer: Self-pay | Admitting: Cardiovascular Disease

## 2012-04-16 ENCOUNTER — Ambulatory Visit (INDEPENDENT_AMBULATORY_CARE_PROVIDER_SITE_OTHER): Payer: BC Managed Care – PPO | Admitting: Cardiovascular Disease

## 2012-04-16 VITALS — BP 140/80 | HR 54 | Ht 73.0 in | Wt 221.1 lb

## 2012-04-16 DIAGNOSIS — I2510 Atherosclerotic heart disease of native coronary artery without angina pectoris: Secondary | ICD-10-CM

## 2012-04-16 MED ORDER — ASPIRIN 81 MG PO TABS: 81.0000 mg | ORAL_TABLET | Freq: Every day | ORAL | Status: AC

## 2012-04-16 NOTE — Progress Notes (Signed)
History of Present Illness: 62 yo male with h/o CAD, hyperlipidemia here today for cardiac follow up. He has been followed in the past by Dr. Juanda Chance. In April 2010 he had a non-ST elevation MI treated with a DES to the LAD. His ejection fraction was initially 40% but this improved to 60%. He had a stress test in [place] in August 2010 where he was involved in a rehabilitation program and this was negative. He is here today for routine yearly follow up. I last saw him in February 2013. He is enrolled in the IMPROVE-IT study.   He is here today for follow up. No chest discomfort or SOB. He is running. Tolerating meds.   Primary Care Physician: Dr. Sherrie Mustache  Last Lipid Profile: Research labs  Past Medical History  Diagnosis Date  . Kidney stones   . NSTEMI (non-ST elevated myocardial infarction)     06/2008  Rx PCI LAD  . Hyperlipidemia     Past Surgical History  Procedure Date  . None     Current Outpatient Prescriptions  Medication Sig Dispense Refill  . aspirin 325 MG tablet Take 81 mg by mouth daily.       . carvedilol (COREG) 6.25 MG tablet TAKE 1 TABLET BY MOUTH TWICE A DAY WITH A MEAL  60 tablet  12  . clopidogrel (PLAVIX) 75 MG tablet TAKE 1 TABLET BY MOUTH EVERY DAY  30 tablet  11  . famotidine (PEPCID) 20 MG tablet Take 1 tablet (20 mg total) by mouth 2 (two) times daily.  60 tablet  11  . NITROSTAT 0.4 MG SL tablet TAKE 1 TABLET UNDER THE TONGUE EVERY 5 MINUTES FOR CHEST PAIN,* MAY REPEAT UP TO 3 TIMES  25 tablet  1  . sildenafil (VIAGRA) 25 MG tablet daily as needed. Take one tab by mouth 30 min prior to activity as needed      . STUDY MEDICATION IMPROVE IT STUDY DRUG  Take as directed        No Known Allergies  History   Social History  . Marital Status: Married    Spouse Name: N/A    Number of Children: 2  . Years of Education: N/A   Occupational History  . MAINTANCE SERVICE    Social History Main Topics  . Smoking status: Never Smoker   . Smokeless  tobacco: Not on file  . Alcohol Use: 1.5 oz/week    3 drink(s) per week  . Drug Use: No  . Sexually Active: Not on file   Other Topics Concern  . 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 Diabetes Brother     over weight  . Heart attack Father 13    Review of Systems:  As stated in the HPI and otherwise negative.   BP 140/80  Pulse 54  Ht 6\' 1"  (1.854 m)  Wt 221 lb 1.9 oz (100.299 kg)  BMI 29.17 kg/m2  Physical Examination: General: Well developed, well nourished, NAD HEENT: OP clear, mucus membranes moist SKIN: warm, dry. No rashes. Neuro: No focal deficits Musculoskeletal: Muscle strength 5/5 all ext Psychiatric: Mood and affect normal Neck: No JVD, no carotid bruits, no thyromegaly, no lymphadenopathy. Lungs:Clear bilaterally, no wheezes, rhonci, crackles Cardiovascular: Huston Foley. Regular rhythm. No murmurs, gallops or rubs. Abdomen:Soft.  Bowel sounds present. Non-tender.  Extremities: No lower extremity edema. Pulses are 2 + in the bilateral DP/PT.  EKG: Sinus brady, rate 50 bpm. Poor R wave progression.   Assessment and Plan:   1. CAD: Stable. No chest pain. He is on dual antiplatelet therapy with ASA and Plavix. Tolerating beta blocker. Will stop Plavix but continue ASA 81 mg po Qdaily.   2. HYPERLIPIDEMIA: He is in the IMPROVE IT trial. Labs followed in research.

## 2012-04-16 NOTE — Patient Instructions (Signed)
Your physician wants you to follow-up in: 12 months.  You will receive a reminder letter in the mail two months in advance. If you don't receive a letter, please call our office to schedule the follow-up appointment.  Your physician has recommended you make the following change in your medication:  Stop Plavix

## 2012-04-25 ENCOUNTER — Other Ambulatory Visit: Payer: Self-pay | Admitting: Cardiovascular Disease

## 2012-04-26 ENCOUNTER — Other Ambulatory Visit: Payer: Self-pay

## 2012-06-23 ENCOUNTER — Encounter (INDEPENDENT_AMBULATORY_CARE_PROVIDER_SITE_OTHER): Payer: BC Managed Care – PPO

## 2012-06-23 DIAGNOSIS — R0989 Other specified symptoms and signs involving the circulatory and respiratory systems: Secondary | ICD-10-CM

## 2012-08-31 ENCOUNTER — Encounter (HOSPITAL_COMMUNITY): Payer: Self-pay | Admitting: *Deleted

## 2012-08-31 ENCOUNTER — Emergency Department (HOSPITAL_COMMUNITY): Payer: BC Managed Care – PPO

## 2012-08-31 ENCOUNTER — Observation Stay (HOSPITAL_COMMUNITY)
Admission: EM | Admit: 2012-08-31 | Discharge: 2012-09-01 | Disposition: A | Discharge status: Home / Self Care | Payer: BC Managed Care – PPO | Attending: Cardiovascular Disease | Admitting: Cardiovascular Disease

## 2012-08-31 DIAGNOSIS — R079 Chest pain, unspecified: Secondary | ICD-10-CM

## 2012-08-31 DIAGNOSIS — I10 Essential (primary) hypertension: Secondary | ICD-10-CM

## 2012-08-31 DIAGNOSIS — I2510 Atherosclerotic heart disease of native coronary artery without angina pectoris: Secondary | ICD-10-CM

## 2012-08-31 DIAGNOSIS — I252 Old myocardial infarction: Secondary | ICD-10-CM | POA: Insufficient documentation

## 2012-08-31 DIAGNOSIS — E785 Hyperlipidemia, unspecified: Secondary | ICD-10-CM

## 2012-08-31 DIAGNOSIS — R110 Nausea: Secondary | ICD-10-CM | POA: Insufficient documentation

## 2012-08-31 DIAGNOSIS — R0602 Shortness of breath: Secondary | ICD-10-CM | POA: Insufficient documentation

## 2012-08-31 HISTORY — DX: Essential (primary) hypertension: I10

## 2012-08-31 HISTORY — DX: Heart disease, unspecified: I51.9

## 2012-08-31 LAB — COMPREHENSIVE METABOLIC PANEL
AST: 23 U/L (ref 0–37)
Albumin: 3.7 g/dL (ref 3.5–5.2)
Alkaline Phosphatase: 63 U/L (ref 39–117)
BUN: 22 mg/dL (ref 6–23)
Chloride: 106 mEq/L (ref 96–112)
Potassium: 3.5 mEq/L (ref 3.5–5.1)
Total Bilirubin: 0.4 mg/dL (ref 0.3–1.2)

## 2012-08-31 LAB — CBC WITH DIFFERENTIAL/PLATELET
Basophils Absolute: 0.1 10*3/uL (ref 0.0–0.1)
Basophils Relative: 1 % (ref 0–1)
Hemoglobin: 13.9 g/dL (ref 13.0–17.0)
MCHC: 35.4 g/dL (ref 30.0–36.0)
Monocytes Relative: 7 % (ref 3–12)
Neutro Abs: 2.9 10*3/uL (ref 1.7–7.7)
Neutrophils Relative %: 57 % (ref 43–77)
WBC: 5.1 10*3/uL (ref 4.0–10.5)

## 2012-08-31 LAB — LIPID PANEL
LDL Cholesterol: 32 mg/dL (ref 0–99)
VLDL: 8 mg/dL (ref 0–40)

## 2012-08-31 LAB — PROTIME-INR: INR: 1.06 (ref 0.00–1.49)

## 2012-08-31 LAB — CBC
HCT: 38.6 % — ABNORMAL LOW (ref 39.0–52.0)
Hemoglobin: 13.5 g/dL (ref 13.0–17.0)
RBC: 4.12 MIL/uL — ABNORMAL LOW (ref 4.22–5.81)

## 2012-08-31 LAB — BASIC METABOLIC PANEL
CO2: 25 mEq/L (ref 19–32)
Chloride: 108 mEq/L (ref 96–112)
Glucose, Bld: 96 mg/dL (ref 70–99)
Potassium: 3.6 mEq/L (ref 3.5–5.1)
Sodium: 140 mEq/L (ref 135–145)

## 2012-08-31 LAB — TROPONIN I
Troponin I: <0.3 ng/mL (ref <0.30)
Troponin I: <0.3 ng/mL (ref <0.30)

## 2012-08-31 MED ORDER — ONDANSETRON HCL 4 MG/2ML IJ SOLN: 4.0000 mg | Freq: Four times a day (QID) | INTRAMUSCULAR | Status: DC | PRN

## 2012-08-31 MED ORDER — NONFORMULARY OR COMPOUNDED ITEM
3.0000 | Freq: Every day | Status: DC
  Administered 2012-08-31: 3 via ORAL
  Filled 2012-08-31: qty 1

## 2012-08-31 MED ORDER — ENOXAPARIN SODIUM 40 MG/0.4ML ~~LOC~~ SOLN
40.0000 mg | SUBCUTANEOUS | Status: DC
  Administered 2012-08-31: 40 mg via SUBCUTANEOUS
  Filled 2012-08-31 (×2): qty 0.4

## 2012-08-31 MED ORDER — FAMOTIDINE 20 MG PO TABS
20.0000 mg | ORAL_TABLET | Freq: Two times a day (BID) | ORAL | Status: DC
  Administered 2012-08-31 – 2012-09-01 (×4): 20 mg via ORAL
  Filled 2012-08-31 (×5): qty 1

## 2012-08-31 MED ORDER — ACETAMINOPHEN 325 MG PO TABS
650.0000 mg | ORAL_TABLET | ORAL | Status: DC | PRN
  Administered 2012-08-31: 650 mg via ORAL
  Filled 2012-08-31: qty 2

## 2012-08-31 MED ORDER — ASPIRIN EC 81 MG PO TBEC
81.0000 mg | DELAYED_RELEASE_TABLET | Freq: Every day | ORAL | Status: DC
  Administered 2012-09-01: 81 mg via ORAL
  Filled 2012-08-31: qty 1

## 2012-08-31 MED ORDER — NITROGLYCERIN IN D5W 200-5 MCG/ML-% IV SOLN
2.0000 ug/min | Freq: Once | INTRAVENOUS | Status: AC
  Administered 2012-08-31: 10 ug/min via INTRAVENOUS
  Filled 2012-08-31: qty 250

## 2012-08-31 MED ORDER — CLOPIDOGREL BISULFATE 75 MG PO TABS
75.0000 mg | ORAL_TABLET | Freq: Every day | ORAL | Status: DC
  Administered 2012-09-01: 75 mg via ORAL
  Filled 2012-08-31 (×3): qty 1

## 2012-08-31 MED ORDER — NITROGLYCERIN 0.4 MG SL SUBL: 0.4000 mg | SUBLINGUAL_TABLET | SUBLINGUAL | Status: DC | PRN

## 2012-08-31 MED ORDER — ATORVASTATIN CALCIUM 40 MG PO TABS
40.0000 mg | ORAL_TABLET | Freq: Every day | ORAL | Status: DC
  Filled 2012-08-31 (×2): qty 1

## 2012-08-31 MED ORDER — NON FORMULARY: 3.0000 | Freq: Every day | Status: DC

## 2012-08-31 MED ORDER — CARVEDILOL 6.25 MG PO TABS
6.2500 mg | ORAL_TABLET | Freq: Two times a day (BID) | ORAL | Status: DC
  Filled 2012-08-31 (×5): qty 1

## 2012-08-31 MED ORDER — ASPIRIN 81 MG PO CHEW
324.0000 mg | CHEWABLE_TABLET | Freq: Once | ORAL | Status: AC
  Administered 2012-08-31: 324 mg via ORAL
  Filled 2012-08-31: qty 4

## 2012-08-31 NOTE — Progress Notes (Signed)
Pt admitted to floor with nitro gtt at . Pt denies cp. Order for nitro one time order by ER doctor. Paged dr. To obtain a continous order or discontinue order. Awaiting call.

## 2012-08-31 NOTE — Progress Notes (Addendum)
Pt bp elevated 160's hr as low as upper 30's- 50's. Called md, dr. Donnie Aho, made him aware. No new orders given. Monitoring will continue.

## 2012-08-31 NOTE — Progress Notes (Signed)
Home medications for cholesterol study stored in second floor pharmacy. Form signed and in chart.

## 2012-08-31 NOTE — Progress Notes (Addendum)
No further CP since admit this am.  Initial troponin normal.  Continue to cycle enzymes.  If they become positive for he has recurrent CP will change Lovenox to full dose and add IV NTG gtt.  Will make NPO after midnight for possible nuclear stress test vs. Cath in am to be determined by Dr. Sanjuana Kava

## 2012-08-31 NOTE — ED Provider Notes (Signed)
History     CSN: [id number]  Arrival date & time 08/31/12  0003   First MD Initiated Contact with Patient 08/31/12 0005      Chief Complaint  Patient presents with  . Chest Pain    (Consider location/radiation/quality/duration/timing/severity/associated sxs/prior treatment) HPI Comments: Patient from home with chest pain that started one hour ago.  Radiates to L axilla and is associated with SOB and nausea.  Similar to previous MI 4 years ago. The patient has a history of 2 stents, no testing since 2010.  No chest pain since then. Denies leg pain or swelling. Pain improved after ASA and NTG x3.  No back pain, abdominal pain. Endorses feeling "crappy" all day with elevated BP and headache that is gradual in onset.  No recent med changes.  The history is provided by the patient and the EMS personnel. The history is limited by the condition of the patient.    Past Medical History  Diagnosis Date  . Kidney stones   . NSTEMI (non-ST elevated myocardial infarction)     06/2008  Rx PCI LAD  . Hyperlipidemia   . Coronary artery disease     Past Surgical History  Procedure Laterality Date  . None      Family History  Problem Relation Age of Onset  . Diabetes Brother     over weight  . Heart attack Father 66    History  Substance Use Topics  . Smoking status: Never Smoker   . Smokeless tobacco: Not on file  . Alcohol Use: 1.5 oz/week    3 drink(s) per week      Review of Systems  Constitutional: Negative for activity change and appetite change.  HENT: Negative for congestion, rhinorrhea and neck pain.   Respiratory: Positive for shortness of breath. Negative for cough.   Cardiovascular: Positive for chest pain.  Gastrointestinal: Negative for nausea, vomiting and abdominal pain.  Genitourinary: Negative for dysuria and hematuria.  Musculoskeletal: Negative for back pain.  Skin: Negative for rash.  Neurological: Negative for dizziness, weakness and headaches.  A  complete 10 system review of systems was obtained and all systems are negative except as noted in the HPI and PMH.    Allergies  Review of patient's allergies indicates no known allergies.  Home Medications   No current outpatient prescriptions on file.  BP 137/79  Pulse 91  Temp(Src) 97.8 F (36.6 C) (Oral)  Resp 16  Ht 6\' 1"  (1.854 m)  Wt 219 lb 5.7 oz (99.5 kg)  BMI 28.95 kg/m2  SpO2 97%  Physical Exam  Constitutional: He is oriented to person, place, and time. He appears well-developed and well-nourished. No distress.  HENT:  Head: Normocephalic.  Mouth/Throat: Oropharynx is clear and moist. No oropharyngeal exudate.  Eyes: Conjunctivae and EOM are normal. Pupils are equal, round, and reactive to light.  Neck: Normal range of motion. Neck supple.  Cardiovascular: Normal rate, regular rhythm and normal heart sounds.   No murmur heard. Pulmonary/Chest: Effort normal and breath sounds normal. No respiratory distress.  Abdominal: Soft. There is no tenderness. There is no rebound and no guarding.  Musculoskeletal: Normal range of motion. He exhibits no edema and no tenderness.  Neurological: He is alert and oriented to person, place, and time. No cranial nerve deficit. He exhibits normal muscle tone. Coordination normal.  Skin: Skin is warm.    ED Course  Procedures (including critical care time)  Labs Reviewed  CBC WITH DIFFERENTIAL - Abnormal; Notable for the  following:    Platelets 131 (*)    Eosinophils Relative 8 (*)    All other components within normal limits  COMPREHENSIVE METABOLIC PANEL - Abnormal; Notable for the following:    Glucose, Bld 105 (*)    GFR calc non Af Amer 60 (*)    GFR calc Af Amer 69 (*)    All other components within normal limits  CBC - Abnormal; Notable for the following:    RBC 4.12 (*)    HCT 38.6 (*)    Platelets 127 (*)    All other components within normal limits  BASIC METABOLIC PANEL - Abnormal; Notable for the following:     GFR calc non Af Amer 67 (*)    GFR calc Af Amer 78 (*)    All other components within normal limits  TROPONIN I  PROTIME-INR  LIPID PANEL  PROTIME-INR   Dg Chest 2 View  08/31/2012   *RADIOLOGY REPORT*  Clinical Data: Chest pain.  CHEST - 2 VIEW  Comparison: 06/15/2008.  Findings: The heart is upper limits of normal in size.  The mediastinal and hilar contours are within normal limits and stable. Coronary artery stents are noted.  The lungs are clear.  No pleural effusion.  The bony thorax is intact.  IMPRESSION: No acute cardiopulmonary findings.   Original Report Authenticated By: Rudie Meyer, M.D.     1. Chest pain       MDM  Chest pain similar to previous MI.  Substernal and radiating to L axilla.  Improved with ASA and NTG.   Chest pain-free at this time. EKG is unchanged. Her troponin is negative. Symptoms are typical for ACS and concerning.  Nitro drip was ordered for recurrent chest pain. Discussed with cardiology who will admit.      Date: 08/31/2012  Rate: 46  Rhythm: normal sinus rhythm  QRS Axis: normal  Intervals: normal  ST/T Wave abnormalities: normal  Conduction Disutrbances:none  Narrative Interpretation:   Old EKG Reviewed: unchanged    Glynn Octave, MD 08/31/12 [id number]

## 2012-08-31 NOTE — ED Notes (Signed)
EKG completed and given to Dr. Rancour along with OLD ekg. 

## 2012-08-31 NOTE — ED Notes (Signed)
Pt has chest pain took 2 ntg sl prior to EMS arrival.  EMS gave 3 ntg en route with relief of pain on arrival to Hospital.  Pt has cardiac stent and MI x 2 in past

## 2012-08-31 NOTE — H&P (Signed)
Jonathan Sharp is an 62 y.o. male.    Primary Care Physician: Dr. Sherrie Mustache  Primary Cardiologist: Dr. Clifton James  Chief Complaint: chest pain  HPI: 62 y/o male with PMH of CAD presenting for chest pain evaluation.  He had a NSTEMI and underwent a cardiac cath 06/29/2008 that showed that his left main was without significant disease, there was mLAD occlusion that was stented with a DES,  60% mCX stenosis, 30% stenosis with mRCA with LVEF 40% by ventriculogram. Echo 10/22/2008 showed that his LVEF has improved to 60-65%.  Patient was doing well until the night prior to presentation when he started to complain of a 6/10 chest pain. Chest pain is described as a pressure, associated with shortness of breath, but he denies nausea/vomiting, PND, orthopnea, palpitation or syncope. His pain was relieved after taking 5 SL NTG. He states that his chest pain is similar to the pain he had that led to his last stent. His initial cardiac marker is negative, and his EKG did not show any evidence of ischemia. He is currently clinically and hemodynamically stable.    Cardiac Cath 06/16/2008  Following placement of tandem overlapping Xience stents in the proximal  and mid LAD. His stenosis improved from 100% to 0% and the flow  improved from TIMI 0 to TIMI III flow.  CONCLUSION:  1. Non-ST elevation anterior wall myocardial infarction with total  occlusion of the proximal left anterior descending artery with  collateral flow from the right, 6% narrowing in the mid circumflex  artery, and 30% narrowing in the mid-right coronary with  anterolateral wall and inferoapical wall hypokinesis, and an  estimated fraction of 40%.  2. Successful percutaneous coronary intervention of the totally  obstructed proximal left anterior descending using Fetch aspiration  and tandem overlapping Xience stent with an improvement in center  narrowing from 100% to 0%, improvement of flow from TIMI 0 to TIMI  III flow.   Past Medical  History  Diagnosis Date  . Kidney stones   . NSTEMI (non-ST elevated myocardial infarction)     06/2008  Rx PCI LAD  . Hyperlipidemia   . Coronary artery disease     Past Surgical History  Procedure Laterality Date  . None      Family History  Problem Relation Age of Onset  . Diabetes Brother     over weight  . Heart attack Father 28   Social History:  reports that he has never smoked. He does not have any smokeless tobacco history on file. He reports that he drinks about 1.5 ounces of alcohol per week. He reports that he does not use illicit drugs.  Allergies: No Known Allergies   (Not in a hospital admission)  Results for orders placed during the hospital encounter of 08/31/12 (from the past 48 hour(s))  CBC WITH DIFFERENTIAL     Status: Abnormal   Collection Time    08/31/12 12:13 AM      Result Value Range   WBC 5.1  4.0 - 10.5 K/uL   RBC 4.22  4.22 - 5.81 MIL/uL   Hemoglobin 13.9  13.0 - 17.0 g/dL   HCT 29.5  62.1 - 30.8 %   MCV 93.1  78.0 - 100.0 fL   MCH 32.9  26.0 - 34.0 pg   MCHC 35.4  30.0 - 36.0 g/dL   RDW 65.7  84.6 - 96.2 %   Platelets 131 (*) 150 - 400 K/uL   Neutrophils Relative % 57  43 -  77 %   Neutro Abs 2.9  1.7 - 7.7 K/uL   Lymphocytes Relative 27  12 - 46 %   Lymphs Abs 1.4  0.7 - 4.0 K/uL   Monocytes Relative 7  3 - 12 %   Monocytes Absolute 0.3  0.1 - 1.0 K/uL   Eosinophils Relative 8 (*) 0 - 5 %   Eosinophils Absolute 0.4  0.0 - 0.7 K/uL   Basophils Relative 1  0 - 1 %   Basophils Absolute 0.1  0.0 - 0.1 K/uL  COMPREHENSIVE METABOLIC PANEL     Status: Abnormal   Collection Time    08/31/12 12:13 AM      Result Value Range   Sodium 138  135 - 145 mEq/L   Potassium 3.5  3.5 - 5.1 mEq/L   Chloride 106  96 - 112 mEq/L   CO2 24  19 - 32 mEq/L   Glucose, Bld 105 (*) 70 - 99 mg/dL   BUN 22  6 - 23 mg/dL   Creatinine, Ser 4.09  0.50 - 1.35 mg/dL   Calcium 8.7  8.4 - 81.1 mg/dL   Total Protein 6.2  6.0 - 8.3 g/dL   Albumin 3.7  3.5 - 5.2  g/dL   AST 23  0 - 37 U/L   ALT 24  0 - 53 U/L   Alkaline Phosphatase 63  39 - 117 U/L   Total Bilirubin 0.4  0.3 - 1.2 mg/dL   GFR calc non Af Amer 60 (*) >90 mL/min   GFR calc Af Amer 69 (*) >90 mL/min   Comment:            The eGFR has been calculated     using the CKD EPI equation.     This calculation has not been     validated in all clinical     situations.     eGFR's persistently     <90 mL/min signify     possible Chronic Kidney Disease.  PROTIME-INR     Status: None   Collection Time    08/31/12 12:13 AM      Result Value Range   Prothrombin Time 14.0  11.6 - 15.2 seconds   INR 1.09  0.00 - 1.49  TROPONIN I     Status: None   Collection Time    08/31/12 12:17 AM      Result Value Range   Troponin I <0.30  <0.30 ng/mL   Comment:            Due to the release kinetics of cTnI,     a negative result within the first hours     of the onset of symptoms does not rule out     myocardial infarction with certainty.     If myocardial infarction is still suspected,     repeat the test at appropriate intervals.   Dg Chest 2 View  08/31/2012   *RADIOLOGY REPORT*  Clinical Data: Chest pain.  CHEST - 2 VIEW  Comparison: 06/15/2008.  Findings: The heart is upper limits of normal in size.  The mediastinal and hilar contours are within normal limits and stable. Coronary artery stents are noted.  The lungs are clear.  No pleural effusion.  The bony thorax is intact.  IMPRESSION: No acute cardiopulmonary findings.   Original Report Authenticated By: Rudie Meyer, M.D.    Review of Systems  Constitutional: Negative for fever, chills, weight loss, malaise/fatigue and diaphoresis.  HENT: Negative for  hearing loss, ear pain, nosebleeds, congestion, neck pain, tinnitus and ear discharge.   Respiratory: Negative for cough, hemoptysis and sputum production.   Cardiovascular: Positive for chest pain. Negative for palpitations, orthopnea, claudication, leg swelling and PND.   Gastrointestinal: Negative for heartburn, nausea, vomiting, abdominal pain, diarrhea and constipation.  Genitourinary: Negative for dysuria, urgency and frequency.  Musculoskeletal: Negative for myalgias and back pain.  Skin: Negative for itching and rash.  Neurological: Negative for dizziness, tingling, weakness and headaches.  Psychiatric/Behavioral: Negative for suicidal ideas and substance abuse.    Blood pressure 145/76, pulse 41, temperature 98.2 F (36.8 C), temperature source Oral, resp. rate 18, SpO2 95.00%. Physical Exam  Constitutional: He is oriented to person, place, and time. He appears well-developed and well-nourished. No distress.  HENT:  Head: Normocephalic.  Eyes: Right eye exhibits no discharge. Left eye exhibits no discharge. No scleral icterus.  Neck: No JVD present. No tracheal deviation present.  Cardiovascular: Normal rate, regular rhythm and normal heart sounds.   Respiratory: No stridor. No respiratory distress. He has no wheezes.  GI: Soft. Bowel sounds are normal. He exhibits no distension. There is no tenderness.  Musculoskeletal: He exhibits no edema and no tenderness.  Lymphadenopathy:    He has no cervical adenopathy.  Neurological: He is alert and oriented to person, place, and time.  Skin: He is not diaphoretic. No erythema.  Psychiatric: He has a normal mood and affect.     Assessment/Plan  1.  Chest pain with typical features. Patient is currently chest pain free. We will observe the patient in our telemetry unit and obtain serial cardiac markers to rule out MI.  His dual anti-platelet medication will be continued. If he has recurrent chest pain, we will initiate oral or intravenous nitrates.  We will add Imdur 30 mg qd and re-evaluate patient in the morning.  2.  Hyperlipidemia: patient is on Lipitor.  We will check fasting lipids in am.   Todd Jelinski E 08/31/2012, 3:45 AM

## 2012-08-31 NOTE — Progress Notes (Signed)
Utilization review completed.  P.J. Brendaliz Kuk,RN,BSN Case Manager [phone]  

## 2012-09-01 ENCOUNTER — Observation Stay (HOSPITAL_COMMUNITY): Payer: BC Managed Care – PPO

## 2012-09-01 ENCOUNTER — Encounter (HOSPITAL_COMMUNITY): Payer: Self-pay | Admitting: Physician Assistant

## 2012-09-01 DIAGNOSIS — I10 Essential (primary) hypertension: Secondary | ICD-10-CM | POA: Diagnosis present

## 2012-09-01 DIAGNOSIS — R079 Chest pain, unspecified: Principal | ICD-10-CM

## 2012-09-01 DIAGNOSIS — I2510 Atherosclerotic heart disease of native coronary artery without angina pectoris: Secondary | ICD-10-CM

## 2012-09-01 MED ORDER — TECHNETIUM TC 99M SESTAMIBI GENERIC - CARDIOLITE
10.0000 | Freq: Once | INTRAVENOUS | Status: AC | PRN
  Administered 2012-09-01: 10 via INTRAVENOUS

## 2012-09-01 MED ORDER — NITROGLYCERIN 0.4 MG SL SUBL: 0.4000 mg | SUBLINGUAL_TABLET | SUBLINGUAL | Status: DC | PRN

## 2012-09-01 MED ORDER — TECHNETIUM TC 99M SESTAMIBI GENERIC - CARDIOLITE
30.0000 | Freq: Once | INTRAVENOUS | Status: AC | PRN
  Administered 2012-09-01: 30 via INTRAVENOUS

## 2012-09-01 MED ORDER — AMLODIPINE BESYLATE 5 MG PO TABS
5.0000 mg | ORAL_TABLET | Freq: Every day | ORAL | Status: DC
  Administered 2012-09-01: 5 mg via ORAL
  Filled 2012-09-01: qty 1

## 2012-09-01 MED ORDER — AMLODIPINE BESYLATE 5 MG PO TABS: 5.0000 mg | ORAL_TABLET | Freq: Every day | ORAL | Status: DC

## 2012-09-01 MED ORDER — SILDENAFIL CITRATE 25 MG PO TABS: 25.0000 mg | ORAL_TABLET | Freq: Every day | ORAL | Status: DC | PRN

## 2012-09-01 MED ORDER — POTASSIUM CHLORIDE CRYS ER 20 MEQ PO TBCR
40.0000 meq | EXTENDED_RELEASE_TABLET | Freq: Once | ORAL | Status: AC
  Administered 2012-09-01: 40 meq via ORAL
  Filled 2012-09-01: qty 2

## 2012-09-01 NOTE — Progress Notes (Signed)
    SUBJECTIVE: No recurrent chest pain over last 36 hours. Feels well this am. No SOB.   BP 142/84  Pulse 52  Temp(Src) 97.9 F (36.6 C) (Oral)  Resp 16  Ht 6\' 1"  (1.854 m)  Wt 219 lb 5.7 oz (99.5 kg)  BMI 28.95 kg/m2  SpO2 97% No intake or output data in the 24 hours ending 09/01/12 0739  PHYSICAL EXAM General: Well developed, well nourished, in no acute distress. Alert and oriented x 3.  Psych:  Good affect, responds appropriately Neck: No JVD. No masses noted.  Lungs: Clear bilaterally with no wheezes or rhonci noted.  Heart: RRR with no murmurs noted. Abdomen: Bowel sounds are present. Soft, non-tender.  Extremities: No lower extremity edema.   LABS: Basic Metabolic Panel:  Recent Labs  16/10/96 0013 08/31/12 0539  NA 138 140  K 3.5 3.6  CL 106 108  CO2 24 25  GLUCOSE 105* 96  BUN 22 21  CREATININE 1.24 1.13  CALCIUM 8.7 8.5   CBC:  Recent Labs  08/31/12 0013 08/31/12 0539  WBC 5.1 4.8  NEUTROABS 2.9  --   HGB 13.9 13.5  HCT 39.3 38.6*  MCV 93.1 93.7  PLT 131* 127*   Cardiac Enzymes:  Recent Labs  08/31/12 1155 08/31/12 1849 08/31/12 2240  TROPONINI <0.30 <0.30 <0.30   Fasting Lipid Panel:  Recent Labs  08/31/12 0539  CHOL 87  HDL 47  LDLCALC 32  TRIG 42  CHOLHDL 1.9    Current Meds: . aspirin EC  81 mg Oral Daily  . atorvastatin  40 mg Oral q1800  . carvedilol  6.25 mg Oral BID WC  . clopidogrel  75 mg Oral Q breakfast  . enoxaparin (LOVENOX) injection  40 mg Subcutaneous Q24H  . famotidine  20 mg Oral BID  . Study Medication IMPROVE-IT Trial  3 tablet Oral QHS    ASSESSMENT AND PLAN:  1. CAD/Chest pain: Cardiac markers negative.EKG without acute changes. No recurrent chest pain. Will plan exercise myoview today. If negative, could be discharged home later. If positive, will plan cath tomorrow.   2. HTN: BP elevated. Will start Norvasc 5 mg po Qdaily.   Terance Pomplun  6/23/20147:39 AM

## 2012-09-01 NOTE — Discharge Summary (Signed)
Discharge Summary   Patient ID: Jonathan Sharp MRN: [id number], DOB/AGE: 1951-02-13 62 y.o. Admit date: 08/31/2012 D/C date:     09/01/2012  Primary Cardiologist: Clifton James  Primary Discharge Diagnoses:  1. Chest pain, ruled out for MI 2. HTN, not well controlled 3. CAD s/p overlapping DES to prox LAD 06/2008 4. Hyperlipidemia  Secondary Discharge Diagnoses:  1. H/o kidney stones  Hospital Course: Jonathan Sharp is a 62 y/o M with history of CAD s/p NSTEMI 06/2008 s/p stenting to LAD, HTN, and HL who presented to [facility] with chest pain. Previous EF was 40% at time of MI improved to 60-65% by f/u echo. He was doing well until the night prior to presentation when he began having 6/10 chest pain. It was described as a pressure, associated with shortness of breath, but he denied nausea/vomiting, PND, orthopnea, palpitation or syncope. His pain was relieved after taking 5 SL NTG. His initial cardiac marker was negative, and his EKG did not show any evidence of ischemia. Given his history, he was admitted to the hospital for further rule-out. Tropnins remained negative. He underwent Myoview stress testing this morning that showed no ischemia. He feels much better. BP was not well controlled thus amlodipine was added. Question if CP was related to elevated BP. He was not tachycardic, tachypnic or hypoxic. CXR was nonacute. Dr. Clifton James has seen and examined him today and feels he is stable for discharge.  Of note his platelet count was noted to be mildly low thus he was instructed to f/u PCP for this. He also had small pulmonary nodules on chest CT in 2010 and was reminded to have this followed as per PCP recommendations. I personally discussed this with him. He was also instructed to check BP at home and call if running high. Precautions were added to his med list regarding potential interaction between NTG & viagra. To clarify H/P, he was no longer on Plavix at home - this had been discontinued in  04/2012 per Dr. Clifton James.  Discharge Vitals: Blood pressure 184/91 (stress test BP - running 142/84 this AM), pulse 52, temperature 97.9 F (36.6 C), temperature source Oral, resp. rate 16, height 6\' 1"  (1.854 m), weight 219 lb 5.7 oz (99.5 kg), SpO2 97.00%.  Labs: Lab Results  Component Value Date   WBC 4.8 08/31/2012   HGB 13.5 08/31/2012   HCT 38.6* 08/31/2012   MCV 93.7 08/31/2012   PLT 127* 08/31/2012     Recent Labs Lab 08/31/12 0013 08/31/12 0539  NA 138 140  K 3.5 3.6  CL 106 108  CO2 24 25  BUN 22 21  CREATININE 1.24 1.13  CALCIUM 8.7 8.5  PROT 6.2  --   BILITOT 0.4  --   ALKPHOS 63  --   ALT 24  --   AST 23  --   GLUCOSE 105* 96    Recent Labs  08/31/12 0017 08/31/12 1155 08/31/12 1849 08/31/12 2240  TROPONINI <0.30 <0.30 <0.30 <0.30   Lab Results  Component Value Date   CHOL 87 08/31/2012   HDL 47 08/31/2012   LDLCALC 32 08/31/2012   TRIG 42 08/31/2012    Diagnostic Studies/Procedures   Dg Chest 2 View 08/31/2012   *RADIOLOGY REPORT*  Clinical Data: Chest pain.  CHEST - 2 VIEW  Comparison: 06/15/2008.  Findings: The heart is upper limits of normal in size.  The mediastinal and hilar contours are within normal limits and stable. Coronary artery stents are noted.  The  lungs are clear.  No pleural effusion.  The bony thorax is intact.  IMPRESSION: No acute cardiopulmonary findings.   Original Report Authenticated By: Rudie Meyer, M.D.   Nuclear Report  09/01/2012   *RADIOLOGY REPORT*  Clinical Data:  Chest pain  MYOCARDIAL IMAGING WITH SPECT (REST AND EXERCISE) GATED LEFT VENTRICULAR WALL MOTION STUDY LEFT VENTRICULAR EJECTION FRACTION  Technique:  Standard myocardial SPECT imaging was performed after resting intravenous injection of 10 mCi Tc-50m sestamibi. Subsequently, exercise tolerance test was performed by the patient under the supervision of the Cardiology staff.  At peak exercise, 30 mCi Tc-67m  sestamibi was injected intravenously and standard myocardial  SPECT imaging was performed.  Quantitative gated imaging was also performed to evaluate left ventricular wall motion and estimate left ventricular ejection fraction.  Comparison:  None.  Findings:  Spect:  Small anterior septal fixed defect on the short axis views predominantly.  Suspect attenuation artifact versus a small area of scarring.  No definite inducible or reversible ischemia with exercise stress.  Wall motion:  Grossly normal wall motion.  Ejection fraction:  Calculated Q G S ejection fraction is 55%.  IMPRESSION: No definite inducible ischemia with exercise stress.   Original Report Authenticated By: Judie Petit. Miles Costain, M.D.     Discharge Medications     Medication List    TAKE these medications       amLODipine 5 MG tablet  Commonly known as:  NORVASC  Take 1 tablet (5 mg total) by mouth daily.     aspirin 81 MG tablet  Take 1 tablet (81 mg total) by mouth daily.     carvedilol 6.25 MG tablet  Commonly known as:  COREG  Take 6.25 mg by mouth 2 (two) times daily with a meal.     famotidine 20 MG tablet  Commonly known as:  PEPCID  Take 20 mg by mouth 2 (two) times daily.     nitroGLYCERIN 0.4 MG SL tablet  Commonly known as:  NITROSTAT  Place 1 tablet (0.4 mg total) under the tongue every 5 (five) minutes as needed for chest pain (up to 3 doses. Do not take this if you have taken Viagra in the past 24 hours.).     sildenafil 25 MG tablet  Commonly known as:  VIAGRA  Take 1 tablet (25 mg total) by mouth daily as needed. Take one tab by mouth 30 min prior to activity as needed. Do not take this if you have taken Nitroglycerin in the past 24 hours.     STUDY MEDICATION  Take 3 tablets by mouth daily. IMPROVE IT STUDY DRUG  Take as directed        Disposition   The patient will be discharged in stable condition to home. Discharge Orders   Future Appointments Provider Department Dept Phone   09/08/2012 8:30 AM [facility] [phone]   09/15/2012 9:30 AM Kathleene Hazel, MD [place] [facility] Main Office [facility]) [phone]   Future Orders Complete By Expires     Diet - low sodium heart healthy  As directed     Increase activity slowly  As directed     Comments:      You may return to work if you are feeling well.   Check your blood pressure periodically at home. If it tends to run greater than 130 on the top number or 80 on the bottom number, call your doctor.    Increase activity slowly  As directed  Follow-up Information   Follow up with Clydell Hakim, MD. (Your platelet count was mildly decreased this admission. Please follow up with primary care doctor to discuss if this has not previously been evaluated. You also should follow up with your PCP for the small pulmonary nodules seen on past CT.)    Contact information:   [address] [place] [address] [phone]       Follow up with [facility], MD. (09/15/12 at 9:30am)    Contact information:   [address] [place] [address] [phone]         Duration of Discharge Encounter: Greater than 30 minutes including physician and PA time.  SignedBjorn Loser Barrett PA-C 09/01/2012, 6:05 PM

## 2012-09-02 NOTE — Discharge Summary (Signed)
See full note 09/01/12. cdm

## 2012-09-08 ENCOUNTER — Encounter (INDEPENDENT_AMBULATORY_CARE_PROVIDER_SITE_OTHER): Payer: BC Managed Care – PPO

## 2012-09-08 DIAGNOSIS — R0989 Other specified symptoms and signs involving the circulatory and respiratory systems: Secondary | ICD-10-CM

## 2012-09-10 ENCOUNTER — Telehealth: Payer: Self-pay | Admitting: Cardiovascular Disease

## 2012-09-10 ENCOUNTER — Other Ambulatory Visit: Payer: Self-pay | Admitting: *Deleted

## 2012-09-10 DIAGNOSIS — E7800 Pure hypercholesterolemia, unspecified: Secondary | ICD-10-CM

## 2012-09-10 MED ORDER — SIMVASTATIN 40 MG PO TABS: 40.0000 mg | ORAL_TABLET | Freq: Every day | ORAL | Status: DC

## 2012-09-10 NOTE — Telephone Encounter (Signed)
Spoke with pharmacist and told her it was OK for pt to take Simvastatin while on amlodipine.  I  had reviewed this with Dr. Clifton James prior to sending. Pt has been in IMPROVE- IT study and this just ended.

## 2012-09-10 NOTE — Telephone Encounter (Signed)
New Prob     Has questions regarding SIMVASTATIN. Please call.

## 2012-09-15 ENCOUNTER — Ambulatory Visit (INDEPENDENT_AMBULATORY_CARE_PROVIDER_SITE_OTHER): Payer: BC Managed Care – PPO | Admitting: Cardiovascular Disease

## 2012-09-15 ENCOUNTER — Encounter: Payer: Self-pay | Admitting: Cardiovascular Disease

## 2012-09-15 VITALS — BP 140/82 | HR 55 | Ht 73.0 in | Wt 222.8 lb

## 2012-09-15 DIAGNOSIS — I2510 Atherosclerotic heart disease of native coronary artery without angina pectoris: Secondary | ICD-10-CM

## 2012-09-15 DIAGNOSIS — E785 Hyperlipidemia, unspecified: Secondary | ICD-10-CM

## 2012-09-15 DIAGNOSIS — I10 Essential (primary) hypertension: Secondary | ICD-10-CM

## 2012-09-15 MED ORDER — AMLODIPINE BESYLATE 10 MG PO TABS: 10.0000 mg | ORAL_TABLET | Freq: Every day | ORAL | Status: DC

## 2012-09-15 NOTE — Progress Notes (Signed)
History of Present Illness: 62 yo male with h/o CAD, hyperlipidemia here today for hospital follow up. He has been followed in the past by Dr. Juanda Chance. In April 2010 he had a non-ST elevation MI treated with a DES to the LAD. His ejection fraction was initially 40% but this improved to 60%. He had a stress test in [facility] in August 2010 where he was involved in a rehabilitation program and this was negative. He was admitted to [facility] 08/31/12 with c/o chest pain. Cardiac markers were negative. Stress myoview 09/01/12 with LVEF=55%, no ischemia.   He is here today for follow up. He is feeling better.  No chest discomfort or SOB. He is active again. Tolerating meds.   Primary Care Physician: Dr. Sherrie Mustache   Last Lipid Profile:Lipid Panel     Component Value Date/Time   CHOL 87 08/31/2012 0539   TRIG 42 08/31/2012 0539   HDL 47 08/31/2012 0539   CHOLHDL 1.9 08/31/2012 0539   VLDL 8 08/31/2012 0539   LDLCALC 32 08/31/2012 0539     Past Medical History  Diagnosis Date  . Kidney stones   . Hyperlipidemia   . Coronary artery disease     a. s/p NSTEMI with DES to LAD 06/2008.  Marland Kitchen HTN (hypertension)   . LV dysfunction     a. EF 40% at time of cath 06/2008, improved to normal on f/u echo.    Past Surgical History  Procedure Laterality Date  . None      Current Outpatient Prescriptions  Medication Sig Dispense Refill  . amLODipine (NORVASC) 5 MG tablet Take 1 tablet (5 mg total) by mouth daily.  30 tablet  6  . aspirin 81 MG tablet Take 1 tablet (81 mg total) by mouth daily.  30 tablet  6  . carvedilol (COREG) 6.25 MG tablet Take 6.25 mg by mouth 2 (two) times daily with a meal.      . famotidine (PEPCID) 20 MG tablet Take 20 mg by mouth 2 (two) times daily.      . nitroGLYCERIN (NITROSTAT) 0.4 MG SL tablet Place 1 tablet (0.4 mg total) under the tongue every 5 (five) minutes as needed for chest pain (up to 3 doses. Do not take this if you have taken Viagra in the past 24 hours.).        Marland Kitchen sildenafil (VIAGRA) 25 MG tablet Take 1 tablet (25 mg total) by mouth daily as needed. Take one tab by mouth 30 min prior to activity as needed. Do not take this if you have taken Nitroglycerin in the past 24 hours.      . simvastatin (ZOCOR) 40 MG tablet Take 1 tablet (40 mg total) by mouth at bedtime.  30 tablet  6   No current facility-administered medications for this visit.    No Known Allergies  History   Social History  . Marital Status: Married    Spouse Name: N/A    Number of Children: 2  . Years of Education: N/A   Occupational History  . MAINTANCE SERVICE    Social History Main Topics  . Smoking status: Never Smoker   . Smokeless tobacco: Not on file  . Alcohol Use: 1.5 oz/week    3 drink(s) per week  . Drug Use: No  . Sexually Active: Not on file   Other Topics Concern  . 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 Diabetes Brother     over weight  . Heart attack Father 11    Review of Systems:  As stated in the HPI and otherwise negative.   BP 140/82  Pulse 55  Ht 6\' 1"  (1.854 m)  Wt 222 lb 12.8 oz (101.061 kg)  BMI 29.4 kg/m2  Physical Examination: General: Well developed, well nourished, NAD HEENT: OP clear, mucus membranes moist SKIN: warm, dry. No rashes. Neuro: No focal deficits Musculoskeletal: Muscle strength 5/5 all ext Psychiatric: Mood and affect normal Neck: No JVD, no carotid bruits, no thyromegaly, no lymphadenopathy. Lungs:Clear bilaterally, no wheezes, rhonci, crackles Cardiovascular: Regular rate and rhythm. No murmurs, gallops or rubs. Abdomen:Soft. Bowel sounds present. Non-tender.  Extremities: No lower extremity edema. Pulses are 2 + in the bilateral DP/PT.  EKG: Sinus brady, rate 55 bpm. Old septal infarct.   Stress myoview  09/01/12: Spect: Small anterior septal fixed defect on the short axis views predominantly. Suspect attenuation artifact versus a small area of scarring. No definite inducible or reversible ischemia with exercise stress. Wall motion: Grossly normal wall motion. Ejection fraction: Calculated Q G S ejection fraction is 55%. IMPRESSION: No definite inducible ischemia with exercise stress.  Assessment and Plan:   1. CAD: Stable. Stress myoview without ischemia 09/01/12. No recurrent chest pain. He is on ASA and beta blocker.   2. HYPERLIPIDEMIA: He had been in the IMPROVE IT trial. Now on Simvastatin.   3. HTN: BP is 130-145 systolic at home. Will increase Norvasc to 10 mg po Qdaily.

## 2012-09-15 NOTE — Patient Instructions (Addendum)
Your physician wants you to follow-up in:  6 months. You will receive a reminder letter in the mail two months in advance. If you don't receive a letter, please call our office to schedule the follow-up appointment.  Your physician has recommended you make the following change in your medication:  Increase amlodipine to 10 mg by mouth daily.    

## 2012-10-22 ENCOUNTER — Other Ambulatory Visit: Payer: Self-pay | Admitting: Cardiovascular Disease

## 2013-01-12 ENCOUNTER — Other Ambulatory Visit: Payer: Self-pay | Admitting: Cardiovascular Disease

## 2013-01-15 ENCOUNTER — Other Ambulatory Visit: Payer: Self-pay

## 2013-03-30 ENCOUNTER — Other Ambulatory Visit: Payer: Self-pay | Admitting: Cardiovascular Disease

## 2013-04-16 ENCOUNTER — Encounter: Payer: Self-pay | Admitting: Cardiovascular Disease

## 2013-04-16 ENCOUNTER — Ambulatory Visit (INDEPENDENT_AMBULATORY_CARE_PROVIDER_SITE_OTHER): Payer: BC Managed Care – PPO | Admitting: Cardiovascular Disease

## 2013-04-16 ENCOUNTER — Other Ambulatory Visit: Payer: Self-pay

## 2013-04-16 VITALS — BP 127/75 | HR 59 | Ht 72.0 in | Wt 235.0 lb

## 2013-04-16 DIAGNOSIS — I10 Essential (primary) hypertension: Secondary | ICD-10-CM

## 2013-04-16 DIAGNOSIS — I2510 Atherosclerotic heart disease of native coronary artery without angina pectoris: Secondary | ICD-10-CM

## 2013-04-16 DIAGNOSIS — E785 Hyperlipidemia, unspecified: Secondary | ICD-10-CM

## 2013-04-16 LAB — BASIC METABOLIC PANEL
BUN: 17 mg/dL (ref 6–23)
CALCIUM: 9.8 mg/dL (ref 8.4–10.5)
CO2: 27 meq/L (ref 19–32)
CREATININE: 1.3 mg/dL (ref 0.4–1.5)
Chloride: 106 mEq/L (ref 96–112)
GFR: 58.91 mL/min — AB (ref >60.00)
Glucose, Bld: 90 mg/dL (ref 70–99)
Potassium: 4.5 mEq/L (ref 3.5–5.1)
Sodium: 139 mEq/L (ref 135–145)

## 2013-04-16 MED ORDER — NITROGLYCERIN 0.4 MG SL SUBL: SUBLINGUAL_TABLET | SUBLINGUAL | Status: DC

## 2013-04-16 MED ORDER — FAMOTIDINE 20 MG PO TABS: 20.0000 mg | ORAL_TABLET | Freq: Two times a day (BID) | ORAL | Status: DC

## 2013-04-16 MED ORDER — CARVEDILOL 6.25 MG PO TABS: 6.2500 mg | ORAL_TABLET | Freq: Two times a day (BID) | ORAL | Status: DC

## 2013-04-16 MED ORDER — SIMVASTATIN 20 MG PO TABS: 20.0000 mg | ORAL_TABLET | Freq: Every day | ORAL | Status: DC

## 2013-04-16 MED ORDER — LOSARTAN POTASSIUM 50 MG PO TABS: 50.0000 mg | ORAL_TABLET | Freq: Every day | ORAL | Status: DC

## 2013-04-16 NOTE — Patient Instructions (Signed)
Your physician wants you to follow-up in:  6 months. You will receive a reminder letter in the mail two months in advance. If you don't receive a letter, please call our office to schedule the follow-up appointment.    Your physician has recommended you make the following change in your medication: Stop Amlodipine.  Start Cozaar 50 mg by mouth daily. Decrease simvastatin to 20 mg by mouth daily

## 2013-04-16 NOTE — Progress Notes (Signed)
History of Present Illness: 63 yo male with h/o CAD, hyperlipidemia here today for hospital follow up. He has been followed in the past by Dr. Olevia Perches. In April 2010 he had a non-ST elevation MI treated with a DES to the LAD. His ejection fraction was initially 40% but this improved to 60%. He had a stress test in [place] in August 2010 where he was involved in a rehabilitation program and this was negative. He was admitted to [facility] 08/31/12 with c/o chest pain. Cardiac markers were negative. Stress myoview 09/01/12 with LVEF=55%, no ischemia. Norvasc was increased at last visit for better BP control.   He is here today for follow up. No chest discomfort or SOB. He has been active. He has had LE edema since increasing dose of Norvasc. Knees and wrists are also aching.   Primary Care Physician: Dr. Caryn Section   Last Lipid Profile:Lipid Panel     Component Value Date/Time   CHOL 87 08/31/2012 0539   TRIG 42 08/31/2012 0539   HDL 47 08/31/2012 0539   CHOLHDL 1.9 08/31/2012 0539   VLDL 8 08/31/2012 0539   LDLCALC 32 08/31/2012 0539     Past Medical History  Diagnosis Date  . Kidney stones   . Hyperlipidemia   . Coronary artery disease     a. s/p NSTEMI with DES to LAD 06/2008.  Marland Kitchen HTN (hypertension)   . LV dysfunction     a. EF 40% at time of cath 06/2008, improved to normal on f/u echo.    Past Surgical History  Procedure Laterality Date  . None      Current Outpatient Prescriptions  Medication Sig Dispense Refill  . amLODipine (NORVASC) 10 MG tablet Take 1 tablet (10 mg total) by mouth daily.  90 tablet  3  . aspirin 81 MG tablet Take 1 tablet (81 mg total) by mouth daily.  30 tablet  6  . carvedilol (COREG) 6.25 MG tablet Take 6.25 mg by mouth 2 (two) times daily with a meal.      . famotidine (PEPCID) 20 MG tablet Take 20 mg by mouth 2 (two) times daily.      Marland Kitchen NITROSTAT 0.4 MG SL tablet TAKE 1 TABLET UNDER THE TONGUE EVERY 5 MINUTES FOR CHEST PAIN,* MAY REPEAT UP TO 3 TIMES   25 tablet  1  . simvastatin (ZOCOR) 40 MG tablet TAKE 1 TABLET (40 MG TOTAL) BY MOUTH AT BEDTIME.  30 tablet  0   No current facility-administered medications for this visit.    No Known Allergies  History   Social History  . Marital Status: Married    Spouse Name: N/A    Number of Children: 2  . Years of Education: N/A   Occupational History  . MAINTANCE SERVICE    Social History Main Topics  . Smoking status: Never Smoker   . Smokeless tobacco: Not on file  . Alcohol Use: 1.5 oz/week    3 drink(s) per week  . Drug Use: No  . Sexual Activity: Not on file   Other Topics Concern  . 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 Diabetes Brother     over weight  . Heart attack Father 13  Review of Systems:  As stated in the HPI and otherwise negative.   BP 127/75  Pulse 59  Ht 6' (1.829 m)  Wt 235 lb (106.595 kg)  BMI 31.86 kg/m2  Physical Examination: General: Well developed, well nourished, NAD HEENT: OP clear, mucus membranes moist SKIN: warm, dry. No rashes. Neuro: No focal deficits Musculoskeletal: Muscle strength 5/5 all ext Psychiatric: Mood and affect normal Neck: No JVD, no carotid bruits, no thyromegaly, no lymphadenopathy. Lungs:Clear bilaterally, no wheezes, rhonci, crackles Cardiovascular: Regular rate and rhythm. No murmurs, gallops or rubs. Abdomen:Soft. Bowel sounds present. Non-tender.  Extremities: No lower extremity edema. Pulses are 2 + in the bilateral DP/PT.  Stress myoview 09/01/12: Spect: Small anterior septal fixed defect on the short axis views predominantly. Suspect attenuation artifact versus a small area of scarring. No definite inducible or reversible ischemia with exercise stress. Wall motion: Grossly normal wall motion. Ejection  fraction: Calculated Q G S ejection fraction is 55%. IMPRESSION: No definite inducible ischemia with exercise stress.  Assessment and Plan:   1. CAD: Stable. Stress myoview without ischemia 09/01/12. No chest pain. He is on ASA and beta blocker. No changes today.   2. HYPERLIPIDEMIA: Continue statin. Lipids well controlled. Will lower Zocor dose to 20 mg per day since lipids are well controlled and he has non-specific joint and muscle aches.   3. HTN: BP controlled but he is having LE edema since his Norvasc dose was increased. Will stop Norvasc and start Cozaar 50 mg po QDaily. Check BMET today. Follow BP at home and call if SBP over 140.

## 2013-07-17 ENCOUNTER — Ambulatory Visit: Payer: Self-pay | Admitting: Unknown Physician Specialty

## 2013-07-17 LAB — HM COLONOSCOPY

## 2013-07-20 LAB — PATHOLOGY REPORT

## 2013-10-15 ENCOUNTER — Encounter: Payer: Self-pay | Admitting: Cardiovascular Disease

## 2013-10-15 ENCOUNTER — Ambulatory Visit (INDEPENDENT_AMBULATORY_CARE_PROVIDER_SITE_OTHER): Payer: BC Managed Care – PPO | Admitting: Cardiovascular Disease

## 2013-10-15 VITALS — BP 132/82 | HR 52 | Ht 73.0 in | Wt 230.0 lb

## 2013-10-15 DIAGNOSIS — E785 Hyperlipidemia, unspecified: Secondary | ICD-10-CM

## 2013-10-15 DIAGNOSIS — I10 Essential (primary) hypertension: Secondary | ICD-10-CM

## 2013-10-15 DIAGNOSIS — I2510 Atherosclerotic heart disease of native coronary artery without angina pectoris: Secondary | ICD-10-CM

## 2013-10-15 LAB — LIPID PANEL
CHOL/HDL RATIO: 3
Cholesterol: 125 mg/dL (ref 0–200)
HDL: 42.4 mg/dL (ref >39.00)
LDL Cholesterol: 55 mg/dL (ref 0–99)
NonHDL: 82.6
TRIGLYCERIDES: 137 mg/dL (ref 0.0–149.0)
VLDL: 27.4 mg/dL (ref 0.0–40.0)

## 2013-10-15 LAB — HEPATIC FUNCTION PANEL
ALK PHOS: 67 U/L (ref 39–117)
ALT: 33 U/L (ref 0–53)
AST: 28 U/L (ref 0–37)
Albumin: 4 g/dL (ref 3.5–5.2)
BILIRUBIN DIRECT: 0.1 mg/dL (ref 0.0–0.3)
TOTAL PROTEIN: 6.9 g/dL (ref 6.0–8.3)
Total Bilirubin: 0.8 mg/dL (ref 0.2–1.2)

## 2013-10-15 MED ORDER — FAMOTIDINE 20 MG PO TABS: 20.0000 mg | ORAL_TABLET | Freq: Two times a day (BID) | ORAL | Status: DC

## 2013-10-15 MED ORDER — SIMVASTATIN 20 MG PO TABS: 20.0000 mg | ORAL_TABLET | Freq: Every day | ORAL | Status: DC

## 2013-10-15 MED ORDER — NITROGLYCERIN 0.4 MG SL SUBL: SUBLINGUAL_TABLET | SUBLINGUAL | Status: DC

## 2013-10-15 MED ORDER — LOSARTAN POTASSIUM 50 MG PO TABS: 50.0000 mg | ORAL_TABLET | Freq: Every day | ORAL | Status: DC

## 2013-10-15 MED ORDER — CARVEDILOL 6.25 MG PO TABS: 6.2500 mg | ORAL_TABLET | Freq: Two times a day (BID) | ORAL | Status: DC

## 2013-10-15 NOTE — Progress Notes (Signed)
History of Present Illness: 63 yo male with h/o CAD, hyperlipidemia here today for follow up. He has been followed in the past by Dr. Olevia Perches. In April 2010 he had a non-ST elevation MI treated with a DES to the LAD. His ejection fraction was initially 40% but this improved to 60%. He was admitted to [facility] 08/31/12 with c/o chest pain. Cardiac markers were negative. Stress myoview 09/01/12 with LVEF=55%, no ischemia. He had LE edema with Norvasc. Cozaar added last visit. Zocor lowered to 20 mg per day due to muscle aches.   He is here today for follow up. No chest discomfort or SOB. He has been active.   Primary Care Physician: Dr. Caryn Section   Last Lipid Profile:Lipid Panel     Component Value Date/Time   CHOL 87 08/31/2012 0539   TRIG 42 08/31/2012 0539   HDL 47 08/31/2012 0539   CHOLHDL 1.9 08/31/2012 0539   VLDL 8 08/31/2012 0539   LDLCALC 32 08/31/2012 0539     Past Medical History  Diagnosis Date  . Kidney stones   . Hyperlipidemia   . Coronary artery disease     a. s/p NSTEMI with DES to LAD 06/2008.  Marland Kitchen HTN (hypertension)   . LV dysfunction     a. EF 40% at time of cath 06/2008, improved to normal on f/u echo.    Past Surgical History  Procedure Laterality Date  . None      Current Outpatient Prescriptions  Medication Sig Dispense Refill  . aspirin 81 MG tablet Take 1 tablet (81 mg total) by mouth daily.  30 tablet  6  . carvedilol (COREG) 6.25 MG tablet Take 1 tablet (6.25 mg total) by mouth 2 (two) times daily with a meal.  60 tablet  6  . famotidine (PEPCID) 20 MG tablet Take 1 tablet (20 mg total) by mouth 2 (two) times daily.  60 tablet  6  . losartan (COZAAR) 50 MG tablet Take 1 tablet (50 mg total) by mouth daily.  30 tablet  6  . nitroGLYCERIN (NITROSTAT) 0.4 MG SL tablet TAKE 1 TABLET UNDER THE TONGUE EVERY 5 MINUTES FOR CHEST PAIN,* MAY REPEAT UP TO 3 TIMES  25 tablet  6  . PEG 3350-KCl-NaBcb-NaCl-NaSulf (PEG-3350/ELECTROLYTES) 236 G SOLR       . simvastatin  (ZOCOR) 20 MG tablet Take 1 tablet (20 mg total) by mouth daily.  30 tablet  6   No current facility-administered medications for this visit.    No Known Allergies  History   Social History  . Marital Status: Married    Spouse Name: N/A    Number of Children: 2  . Years of Education: N/A   Occupational History  . MAINTANCE SERVICE    Social History Main Topics  . Smoking status: Never Smoker   . Smokeless tobacco: Not on file  . Alcohol Use: 1.5 oz/week    3 drink(s) per week  . Drug Use: No  . Sexual Activity: Not on file   Other Topics Concern  . 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 Diabetes Brother     over weight  . Heart attack Father 55    Review of Systems:  As stated  in the HPI and otherwise negative.   BP 132/82  Pulse 52  Ht 6\' 1"  (1.854 m)  Wt 230 lb (104.327 kg)  BMI 30.35 kg/m2  Physical Examination: General: Well developed, well nourished, NAD HEENT: OP clear, mucus membranes moist SKIN: warm, dry. No rashes. Neuro: No focal deficits Musculoskeletal: Muscle strength 5/5 all ext Psychiatric: Mood and affect normal Neck: No JVD, no carotid bruits, no thyromegaly, no lymphadenopathy. Lungs:Clear bilaterally, no wheezes, rhonci, crackles Cardiovascular: Regular rate and rhythm. No murmurs, gallops or rubs. Abdomen:Soft. Bowel sounds present. Non-tender.  Extremities: No lower extremity edema. Pulses are 2 + in the bilateral DP/PT.  Stress myoview 09/01/12: Spect: Small anterior septal fixed defect on the short axis views predominantly. Suspect attenuation artifact versus a small area of scarring. No definite inducible or reversible ischemia with exercise stress. Wall motion: Grossly normal wall motion. Ejection fraction: Calculated Q G S  ejection fraction is 55%. IMPRESSION: No definite inducible ischemia with exercise stress.  EKG: Sinus brady, rate 52 bpm.   Assessment and Plan:   1. CAD: Stable. Stress myoview without ischemia 09/01/12. No chest pain. He is on ASA and beta blocker. No changes today.   2. HYPERLIPIDEMIA: Continue statin. Lipids well controlled. Will repeat lipids and LFTs.   3. HTN: BP controlled. No changes.

## 2013-10-15 NOTE — Patient Instructions (Signed)
Your physician wants you to follow-up in:  12 months.  You will receive a reminder letter in the mail two months in advance. If you don't receive a letter, please call our office to schedule the follow-up appointment.   

## 2014-04-25 ENCOUNTER — Other Ambulatory Visit: Payer: Self-pay | Admitting: Cardiovascular Disease

## 2014-04-26 NOTE — Telephone Encounter (Signed)
PATIENT SHOULD HAVE FOLLOW UP APPT.

## 2014-09-23 ENCOUNTER — Ambulatory Visit (INDEPENDENT_AMBULATORY_CARE_PROVIDER_SITE_OTHER): Payer: BC Managed Care – PPO | Admitting: Family Medicine

## 2014-09-23 ENCOUNTER — Encounter: Payer: Self-pay | Admitting: Family Medicine

## 2014-09-23 ENCOUNTER — Other Ambulatory Visit: Payer: Self-pay

## 2014-09-23 ENCOUNTER — Telehealth: Payer: Self-pay | Admitting: Family Medicine

## 2014-09-23 VITALS — BP 128/80 | HR 70 | Temp 97.5°F | Resp 14 | Wt 235.6 lb

## 2014-09-23 DIAGNOSIS — H6093 Unspecified otitis externa, bilateral: Secondary | ICD-10-CM | POA: Diagnosis not present

## 2014-09-23 MED ORDER — PRAMOXINE-HC-CHLOROXYLENOL 10-10-1 MG/ML OT SOLN: 3.0000 [drp] | Freq: Four times a day (QID) | OTIC | Status: DC

## 2014-09-23 MED ORDER — NEOMYCIN-POLYMYXIN-HC 1 % OT SOLN: 3.0000 [drp] | Freq: Four times a day (QID) | OTIC | Status: DC

## 2014-09-23 NOTE — Telephone Encounter (Signed)
Pt wanted to make sure the pharmacy contacted our office about sending in a new RX because they do not carry that medication. Thanks TNP

## 2014-09-23 NOTE — Progress Notes (Signed)
Subjective:    Patient ID: Jonathan Sharp, male    DOB: 11-Jun-1950, 64 y.o.   MRN: [id number] Chief Complaint  Patient presents with  . Ear Pain    bilateral, Rt ear X 1 week, Left ear X 4 months    HPI  This 64 year old male having 2-8/10 pain intermittently in the left ear for 4 months. Started in the right ear this week. Usually wear hearing aids in both ears over the past year. Painful to wear on the left. Slight soreness on the right to use hearing aid in the ear. Denies fever, drainage and no worsening of hearing loss. Some scratchy throat and slight nasal congestion. Feel as if something in the left ear.  Past Medical History  Diagnosis Date  . Kidney stones   . Hyperlipidemia   . Coronary artery disease     a. s/p NSTEMI with DES to LAD 06/2008.  Marland Kitchen HTN (hypertension)   . LV dysfunction     a. EF 40% at time of cath 06/2008, improved to normal on f/u echo.   Past Surgical History  Procedure Laterality Date  . Carotid stent  2010   History  Substance Use Topics  . Smoking status: Never Smoker   . Smokeless tobacco: Not on file  . Alcohol Use: 1.5 oz/week    3 drink(s) per week   Family History  Problem Relation Age of Onset  . Diabetes Brother     over weight  . Heart attack Father 43   Current Outpatient Prescriptions on File Prior to Visit  Medication Sig Dispense Refill  . aspirin 81 MG tablet Take 1 tablet (81 mg total) by mouth daily. 30 tablet 6  . carvedilol (COREG) 6.25 MG tablet TAKE 1 TABLET BY MOUTH TWICE A DAY WITH A MEAL 60 tablet 5  . famotidine (PEPCID) 20 MG tablet Take 1 tablet (20 mg total) by mouth 2 (two) times daily. 60 tablet 11  . losartan (COZAAR) 50 MG tablet Take 1 tablet (50 mg total) by mouth daily. 30 tablet 11  . nitroGLYCERIN (NITROSTAT) 0.4 MG SL tablet TAKE 1 TABLET UNDER THE TONGUE EVERY 5 MINUTES FOR CHEST PAIN,* MAY REPEAT UP TO 3 TIMES 25 tablet 6  . simvastatin (ZOCOR) 20 MG tablet Take 1 tablet (20 mg total) by mouth daily. 30  tablet 11   No current facility-administered medications on file prior to visit.   No Known Allergies  Review of Systems  Constitutional: Negative.   HENT: Positive for congestion, ear pain and hearing loss.        History of chronic hearing loss. Usually wear bilateral hearing aids. Unable to due to pain in canals today.   Eyes: Negative.   Respiratory: Negative.   Cardiovascular: Negative.       BP 128/80 mmHg  Pulse 70  Temp(Src) 97.5 F (36.4 C) (Oral)  Resp 14  Wt 235 lb 9.6 oz (106.867 kg)  Objective:   Physical Exam  Constitutional: He is oriented to person, place, and time. He appears well-developed and well-nourished. No distress.  HENT:  Head: Normocephalic and atraumatic.  Right Ear: There is tenderness. No drainage. Decreased hearing is noted.  Left Ear: There is tenderness. No drainage. Decreased hearing is noted.  Nose: Nose normal.  Eyes: Conjunctivae, EOM and lids are normal. Right eye exhibits no discharge. Left eye exhibits no discharge. No scleral icterus.  Went to ophthalmologist this morning and pupils are still dilated.  Cardiovascular: Normal  rate and regular rhythm.   Right ear worse than left. Canals have white debris and very tender to move tragus or introduce speculum.  Pulmonary/Chest: Effort normal and breath sounds normal. No respiratory distress.  Musculoskeletal: Normal range of motion.  Neurological: He is alert and oriented to person, place, and time.  Skin: Skin is intact. No lesion and no rash noted.  Psychiatric: He has a normal mood and affect. His speech is normal and behavior is normal. Thought content normal.      Assessment & Plan:  1. Bilateral otitis externa Recent flare of ear pain. No discharge from either ear. Wears hearing aids in the ear canals but unable to use in the right ear recently due to pain. Will treat Cortisporin Otic drops and recheck in 5-7 days.

## 2014-09-23 NOTE — Telephone Encounter (Signed)
Jonathan Sharp sent a alternate RX to the pharmacy. Patient is aware.

## 2014-09-23 NOTE — Telephone Encounter (Signed)
Dabney with [facility] in [place] called stating they rec'd a Rx for Pramoxine-HC-Chloroxylenol 10-10-1 MG/ML SOLN and this is not available in there system.  CB#[phone]/MW

## 2014-09-28 ENCOUNTER — Encounter: Payer: Self-pay | Admitting: Family Medicine

## 2014-09-28 ENCOUNTER — Ambulatory Visit (INDEPENDENT_AMBULATORY_CARE_PROVIDER_SITE_OTHER): Payer: BC Managed Care – PPO | Admitting: Family Medicine

## 2014-09-28 VITALS — BP 138/88 | HR 56 | Temp 97.9°F | Resp 16 | Wt 235.4 lb

## 2014-09-28 DIAGNOSIS — H6092 Unspecified otitis externa, left ear: Secondary | ICD-10-CM | POA: Diagnosis not present

## 2014-09-28 DIAGNOSIS — H6091 Unspecified otitis externa, right ear: Secondary | ICD-10-CM | POA: Insufficient documentation

## 2014-09-28 NOTE — Progress Notes (Signed)
Patient ID: Jonathan Sharp, male   DOB: April 16, 1950, 64 y.o.   MRN: [id number]       Patient: Jonathan Sharp Male    DOB: Sep 07, 1950   64 y.o.   MRN: [id number] Visit Date: 09/28/2014  Today's Provider: Vernie Murders, PA   Chief Complaint  Patient presents with  . Ear Pain    follow-up, was seen on July 14th, 2016. pt stated that "it is better now that I have been putting those drops in"   Subjective:    HPI     No Known Allergies Previous Medications   ASPIRIN 81 MG TABLET    Take 1 tablet (81 mg total) by mouth daily.   CARVEDILOL (COREG) 6.25 MG TABLET    TAKE 1 TABLET BY MOUTH TWICE A DAY WITH A MEAL   FAMOTIDINE (PEPCID) 20 MG TABLET    Take 1 tablet (20 mg total) by mouth 2 (two) times daily.   HYDROXYCHLOROQUINE (PLAQUENIL) 200 MG TABLET    Take 200 mg by mouth daily.   LOSARTAN (COZAAR) 50 MG TABLET    Take 1 tablet (50 mg total) by mouth daily.   NEOMYCIN-POLYMYXIN-HYDROCORTISONE (CORTISPORIN) 1 % SOLN OTIC SOLUTION    Place 3 drops into both ears 4 (four) times daily.   NITROGLYCERIN (NITROSTAT) 0.4 MG SL TABLET    TAKE 1 TABLET UNDER THE TONGUE EVERY 5 MINUTES FOR CHEST PAIN,* MAY REPEAT UP TO 3 TIMES   SIMVASTATIN (ZOCOR) 20 MG TABLET    Take 1 tablet (20 mg total) by mouth daily.    Review of Systems  Constitutional: Negative.   HENT: Negative.     History  Substance Use Topics  . Smoking status: Never Smoker   . Smokeless tobacco: Not on file  . Alcohol Use: 1.5 oz/week    3 drink(s) per week   Objective:   BP 138/88 mmHg  Pulse 56  Temp(Src) 97.9 F (36.6 C) (Oral)  Resp 16  Wt 235 lb 6.4 oz (106.777 kg)  Physical Exam  Constitutional: He appears well-developed and well-nourished.  HENT:  Right Ear: External ear normal.  Left Ear: External ear normal.  Canals clear and clean. No tenderness or drainage. Questionable clear fluid line on the left TM. No redness. Hearing deficit unchanged and continues hearing aid on the right.      Assessment & Plan:      1. Otitis externa of left ear Resolved. No pain, redness or debris remaining. Keep ears dry as possible. May use swimmer's eardrops to prevent further problems. Recheck prn.           Vernie Murders, PA  [facility]

## 2014-10-11 ENCOUNTER — Other Ambulatory Visit: Payer: Self-pay | Admitting: Adult Health

## 2014-10-18 ENCOUNTER — Other Ambulatory Visit: Payer: Self-pay | Admitting: Cardiovascular Disease

## 2014-10-19 ENCOUNTER — Other Ambulatory Visit: Payer: Self-pay | Admitting: Cardiovascular Disease

## 2014-10-20 ENCOUNTER — Encounter: Payer: Self-pay | Admitting: Cardiovascular Disease

## 2014-10-20 ENCOUNTER — Ambulatory Visit (INDEPENDENT_AMBULATORY_CARE_PROVIDER_SITE_OTHER): Payer: BC Managed Care – PPO | Admitting: Cardiovascular Disease

## 2014-10-20 VITALS — BP 110/70 | HR 52 | Ht 73.0 in | Wt 234.0 lb

## 2014-10-20 DIAGNOSIS — E785 Hyperlipidemia, unspecified: Secondary | ICD-10-CM

## 2014-10-20 DIAGNOSIS — I2510 Atherosclerotic heart disease of native coronary artery without angina pectoris: Secondary | ICD-10-CM

## 2014-10-20 DIAGNOSIS — I10 Essential (primary) hypertension: Secondary | ICD-10-CM

## 2014-10-20 LAB — COMPREHENSIVE METABOLIC PANEL
ALBUMIN: 4.2 g/dL (ref 3.5–5.2)
ALT: 23 U/L (ref 0–53)
AST: 21 U/L (ref 0–37)
Alkaline Phosphatase: 57 U/L (ref 39–117)
BUN: 20 mg/dL (ref 6–23)
CALCIUM: 8.9 mg/dL (ref 8.4–10.5)
CHLORIDE: 108 meq/L (ref 96–112)
CO2: 27 mEq/L (ref 19–32)
Creatinine, Ser: 1.33 mg/dL (ref 0.40–1.50)
GFR: 57.11 mL/min — AB (ref >60.00)
Glucose, Bld: 97 mg/dL (ref 70–99)
POTASSIUM: 4.3 meq/L (ref 3.5–5.1)
SODIUM: 141 meq/L (ref 135–145)
TOTAL PROTEIN: 6.8 g/dL (ref 6.0–8.3)
Total Bilirubin: 0.5 mg/dL (ref 0.2–1.2)

## 2014-10-20 LAB — LIPID PANEL
Cholesterol: 104 mg/dL (ref 0–200)
HDL: 39.5 mg/dL (ref >39.00)
LDL CALC: 36 mg/dL (ref 0–99)
NonHDL: 64.87
Total CHOL/HDL Ratio: 3
Triglycerides: 143 mg/dL (ref 0.0–149.0)
VLDL: 28.6 mg/dL (ref 0.0–40.0)

## 2014-10-20 MED ORDER — LOSARTAN POTASSIUM 50 MG PO TABS: 50.0000 mg | ORAL_TABLET | Freq: Every day | ORAL | Status: DC

## 2014-10-20 MED ORDER — CARVEDILOL 6.25 MG PO TABS: ORAL_TABLET | ORAL | Status: DC

## 2014-10-20 MED ORDER — SIMVASTATIN 20 MG PO TABS: 20.0000 mg | ORAL_TABLET | Freq: Every day | ORAL | Status: DC

## 2014-10-20 NOTE — Patient Instructions (Signed)
Medication Instructions:  Your physician recommends that you continue on your current medications as directed. Please refer to the Current Medication list given to you today.   Labwork: Your physician recommends that you return for lab work TODAY   Testing/Procedures: NONE  Follow-Up: Your physician wants you to follow-up in: [place] DR. McAlhany. You will receive a reminder letter in the mail two months in advance. If you don't receive a letter, please call our office to schedule the follow-up appointment.   Any Other Special Instructions Will Be Listed Below (If Applicable).

## 2014-10-20 NOTE — Progress Notes (Signed)
Chief Complaint  Patient presents with  . Follow-up    History of Present Illness: 64 yo male with h/o CAD, hyperlipidemia here today for follow up. He has been followed in the past by Dr. Olevia Perches. In April 2010 he had a non-ST elevation MI treated with a DES to the LAD. His ejection fraction was initially 40% but this improved to 60%. He was admitted to [facility] 08/31/12 with c/o chest pain. Cardiac markers were negative. Stress myoview 09/01/12 with LVEF=55%, no ischemia. He had LE edema with Norvasc. Cozaar was added. He did not tolerate high dose statin therapy. Zocor lowered to 20 mg per day due to muscle aches.   He is here today for follow up. No chest discomfort or SOB. He has been active. He works in the yard.   Primary Care Physician: Dr. Caryn Section   Past Medical History  Diagnosis Date  . Kidney stones   . Hyperlipidemia   . Coronary artery disease     a. s/p NSTEMI with DES to LAD 06/2008.  Marland Kitchen HTN (hypertension)   . LV dysfunction     a. EF 40% at time of cath 06/2008, improved to normal on f/u echo.    Past Surgical History  Procedure Laterality Date  . Carotid stent  2010    Current Outpatient Prescriptions  Medication Sig Dispense Refill  . aspirin 81 MG tablet Take 1 tablet (81 mg total) by mouth daily. 30 tablet 6  . carvedilol (COREG) 6.25 MG tablet TAKE 1 TABLET BY MOUTH TWICE A DAY WITH A MEAL 180 tablet 3  . famotidine (PEPCID) 20 MG tablet Take 1 tablet (20 mg total) by mouth 2 (two) times daily. 60 tablet 11  . hydroxychloroquine (PLAQUENIL) 200 MG tablet Take 200 mg by mouth daily.  6  . losartan (COZAAR) 50 MG tablet Take 1 tablet (50 mg total) by mouth daily. 90 tablet 3  . NEOMYCIN-POLYMYXIN-HYDROCORTISONE (CORTISPORIN) 1 % SOLN otic solution Place 3 drops into both ears 4 (four) times daily. 10 mL 0  . nitroGLYCERIN (NITROSTAT) 0.4 MG SL tablet TAKE 1 TABLET UNDER THE TONGUE EVERY 5 MINUTES FOR CHEST PAIN,* MAY REPEAT UP TO 3 TIMES 25 tablet 6  .  simvastatin (ZOCOR) 20 MG tablet Take 1 tablet (20 mg total) by mouth daily. 90 tablet 3   No current facility-administered medications for this visit.    No Known Allergies  Social History   Social History  . Marital Status: Married    Spouse Name: N/A  . Number of Children: 2  . Years of Education: N/A   Occupational History  . MAINTANCE SERVICE    Social History Main Topics  . Smoking status: Never Smoker   . Smokeless tobacco: Not on file  . Alcohol Use: 1.5 oz/week    3 drink(s) per week  . Drug Use: No  . Sexual Activity: Not on file   Other Topics Concern  . 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 Diabetes Brother     over weight  . Heart attack Father 66    Review of Systems:  As stated in the HPI and otherwise negative.   BP 110/70 mmHg  Pulse 52  Ht _0  (1.854  m)  Wt 234 lb (106.142 kg)  BMI 30.88 kg/m2  Physical Examination: General: Well developed, well nourished, NAD HEENT: OP clear, mucus membranes moist SKIN: warm, dry. No rashes. Neuro: No focal deficits Musculoskeletal: Muscle strength 5/5 all ext Psychiatric: Mood and affect normal Neck: No JVD, no carotid bruits, no thyromegaly, no lymphadenopathy. Lungs:Clear bilaterally, no wheezes, rhonci, crackles Cardiovascular: Regular rate and rhythm. No murmurs, gallops or rubs. Abdomen:Soft. Bowel sounds present. Non-tender.  Extremities: No lower extremity edema. Pulses are 2 + in the bilateral DP/PT.  Stress myoview 09/01/12: Spect: Small anterior septal fixed defect on the short axis views predominantly. Suspect attenuation artifact versus a small area of scarring. No definite inducible or reversible ischemia with exercise stress. Wall motion: Grossly normal wall motion. Ejection fraction:  Calculated Q G S ejection fraction is 55%. IMPRESSION: No definite inducible ischemia with exercise stress.  EKG:  EKG is ordered today. The ekg ordered today demonstrates Sinus brady, rate 52 bpm. 1st degree AV block.   Recent Labs: No results found for requested labs within last 365 days.   Lipid Panel    Component Value Date/Time   CHOL 125 10/15/2013 0820   TRIG 137.0 10/15/2013 0820   HDL 42.40 10/15/2013 0820   CHOLHDL 3 10/15/2013 0820   VLDL 27.4 10/15/2013 0820   LDLCALC 55 10/15/2013 0820     Wt Readings from Last 3 Encounters:  10/20/14 234 lb (106.142 kg)  09/28/14 235 lb 6.4 oz (106.777 kg)  09/23/14 235 lb 9.6 oz (106.867 kg)     Other studies Reviewed: Additional studies/ records that were reviewed today include: . Review of the above records demonstrates:    Assessment and Plan:   1. CAD: Stable. Stress myoview without ischemia 09/01/12. No chest pain. He is on ASA, statin and beta blocker. No changes today.   2. HYPERLIPIDEMIA: Continue statin. Lipids well controlled. Will check lipids and LFTs today.   3. HTN: BP controlled. No changes.  Will check BMET today.   Current medicines are reviewed at length with the patient today.  The patient does not have concerns regarding medicines.  The following changes have been made:  no change  Labs/ tests ordered today include:   Orders Placed This Encounter  Procedures  . Comp Met (CMET)  . Lipid Profile  . Comp Met (CMET)  . EKG 12-Lead     Disposition:   FU with me in 12  months   Signed, Lauree Chandler, MD 10/20/2014 10:28 AM    [facility] [address], [place], [place]  [address] Phone: [phone]; Fax: [phone]

## 2014-11-06 ENCOUNTER — Other Ambulatory Visit: Payer: Self-pay | Admitting: Cardiovascular Disease

## 2014-11-14 ENCOUNTER — Other Ambulatory Visit: Payer: Self-pay | Admitting: Cardiovascular Disease

## 2014-11-18 ENCOUNTER — Other Ambulatory Visit: Payer: Self-pay | Admitting: Cardiovascular Disease

## 2015-01-01 ENCOUNTER — Other Ambulatory Visit: Payer: Self-pay | Admitting: Cardiovascular Disease

## 2015-06-06 ENCOUNTER — Other Ambulatory Visit: Payer: Self-pay | Admitting: Cardiovascular Disease

## 2015-07-27 DIAGNOSIS — M7061 Trochanteric bursitis, right hip: Secondary | ICD-10-CM | POA: Diagnosis not present

## 2015-07-27 DIAGNOSIS — M150 Primary generalized (osteo)arthritis: Secondary | ICD-10-CM | POA: Diagnosis not present

## 2015-07-27 DIAGNOSIS — M1990 Unspecified osteoarthritis, unspecified site: Secondary | ICD-10-CM | POA: Diagnosis not present

## 2015-10-04 ENCOUNTER — Other Ambulatory Visit: Payer: Self-pay | Admitting: Cardiovascular Disease

## 2015-10-04 DIAGNOSIS — I10 Essential (primary) hypertension: Secondary | ICD-10-CM

## 2015-10-04 DIAGNOSIS — E785 Hyperlipidemia, unspecified: Secondary | ICD-10-CM

## 2015-10-04 DIAGNOSIS — I2510 Atherosclerotic heart disease of native coronary artery without angina pectoris: Secondary | ICD-10-CM

## 2015-10-17 ENCOUNTER — Ambulatory Visit (INDEPENDENT_AMBULATORY_CARE_PROVIDER_SITE_OTHER): Payer: Medicare Other | Admitting: Family Medicine

## 2015-10-17 ENCOUNTER — Encounter: Payer: Self-pay | Admitting: Family Medicine

## 2015-10-17 VITALS — BP 120/80 | HR 62 | Temp 97.6°F | Resp 18 | Wt 225.0 lb

## 2015-10-17 DIAGNOSIS — J019 Acute sinusitis, unspecified: Secondary | ICD-10-CM | POA: Diagnosis not present

## 2015-10-17 MED ORDER — AMOXICILLIN 500 MG PO CAPS: 1000.0000 mg | ORAL_CAPSULE | Freq: Two times a day (BID) | ORAL | 0 refills | Status: AC

## 2015-10-17 MED ORDER — FLUTICASONE PROPIONATE 50 MCG/ACT NA SUSP: 2.0000 | Freq: Every day | NASAL | 6 refills | Status: DC

## 2015-10-17 NOTE — Progress Notes (Signed)
Patient: Jonathan Sharp Male    DOB: 1950/07/31   65 y.o.   MRN: [id number] Visit Date: 10/17/2015  Today's Provider: Lelon Huh, MD   Chief Complaint  Patient presents with  . Cough   Subjective:    Cough for 14 days. Patient has symptoms of constant cough, chest tightness, muscle ache, runny nose, and headaches. Patient has tried otc robitussin with mild relief. Symptoms have gradually worsened. No fever.   Cough  This is a new problem. The current episode started 1 to 4 weeks ago (2 weeks). The problem has been gradually worsening. The problem occurs every few minutes. The cough is productive of sputum. Associated symptoms include chest pain, headaches, myalgias, postnasal drip and rhinorrhea. Pertinent negatives include no chills, ear congestion, ear pain, fever, heartburn, hemoptysis, nasal congestion, rash, sore throat, shortness of breath, sweats, weight loss or wheezing. The symptoms are aggravated by lying down and exercise. Treatments tried: robitussin otc. The treatment provided mild relief. There is no history of asthma, bronchitis, COPD, environmental allergies or pneumonia.      No Known Allergies Current Meds  Medication Sig  . aspirin 81 MG tablet Take 1 tablet (81 mg total) by mouth daily.  . carvedilol (COREG) 6.25 MG tablet TAKE 1 TABLET BY MOUTH TWICE A DAY WITH A MEAL  . famotidine (PEPCID) 20 MG tablet TAKE 1 TABLET BY MOUTH TWICE A DAY  . hydroxychloroquine (PLAQUENIL) 200 MG tablet Take 200 mg by mouth daily.  Marland Kitchen losartan (COZAAR) 50 MG tablet Take 1 tablet (50 mg total) by mouth daily.  Marland Kitchen NITROSTAT 0.4 MG SL tablet TAKE 1 TABLET UNDER THE TONGUE EVERY 5 MINUTES FOR CHEST PAIN,* MAY REPEAT UP TO 3 TIMES  . simvastatin (ZOCOR) 20 MG tablet TAKE 1 TABLET BY MOUTH DAILY.    Review of Systems  Constitutional: Negative for appetite change, chills, fever and weight loss.  HENT: Positive for congestion, postnasal drip, rhinorrhea, sinus pressure and voice  change. Negative for ear pain, sore throat and trouble swallowing.   Respiratory: Positive for cough. Negative for hemoptysis, chest tightness, shortness of breath and wheezing.   Cardiovascular: Positive for chest pain. Negative for palpitations.  Gastrointestinal: Negative for abdominal pain, heartburn, nausea and vomiting.  Musculoskeletal: Positive for myalgias.  Skin: Negative for rash.  Allergic/Immunologic: Negative for environmental allergies.  Neurological: Positive for headaches.    Social History  Substance Use Topics  . Smoking status: Never Smoker  . Smokeless tobacco: Not on file  . Alcohol use 1.5 oz/week    3 drink(s) per week   Objective:   BP 120/80 (BP Location: Right Arm, Patient Position: Sitting, Cuff Size: Large)   Pulse 62   Temp 97.6 F (36.4 C) (Oral)   Resp 18   Wt 225 lb (102.1 kg)   SpO2 96%   BMI 29.69 kg/m   Physical Exam  General Appearance:    Alert, cooperative, no distress  HENT:   bilateral TM normal without fluid or infection, neck without nodes, pharynx erythematous without exudate, maxillary and frontal sinus tender and nasal mucosa pale and congested  Eyes:    PERRL, conjunctiva/corneas clear, EOM's intact       Lungs:     Clear to auscultation bilaterally, respirations unlabored  Heart:    Regular rate and rhythm  Neurologic:   Awake, alert, oriented x 3. No apparent focal neurological           defect.  Assessment & Plan:     1. Acute sinusitis, recurrence not specified, unspecified location  - amoxicillin (AMOXIL) 500 MG capsule; Take 2 capsules (1,000 mg total) by mouth 2 (two) times daily.  Dispense: 40 capsule; Refill: 0 - fluticasone (FLONASE) 50 MCG/ACT nasal spray; Place 2 sprays into both nostrils daily.  Dispense: 16 g; Refill: 6     The entirety of the information documented in the History of Present Illness, Review of Systems and Physical Exam were personally obtained by me. Portions of this information were  initially documented by Ashley Royalty, CMA and reviewed by me for thoroughness and accuracy.    Lelon Huh, MD  [facility]

## 2015-10-17 NOTE — Patient Instructions (Signed)

## 2015-10-26 ENCOUNTER — Other Ambulatory Visit: Payer: Self-pay | Admitting: Cardiovascular Disease

## 2015-10-26 DIAGNOSIS — I2510 Atherosclerotic heart disease of native coronary artery without angina pectoris: Secondary | ICD-10-CM

## 2015-10-26 DIAGNOSIS — E785 Hyperlipidemia, unspecified: Secondary | ICD-10-CM

## 2015-10-26 DIAGNOSIS — I10 Essential (primary) hypertension: Secondary | ICD-10-CM

## 2015-11-13 ENCOUNTER — Other Ambulatory Visit: Payer: Self-pay | Admitting: Cardiovascular Disease

## 2015-11-26 ENCOUNTER — Other Ambulatory Visit: Payer: Self-pay | Admitting: Cardiovascular Disease

## 2015-11-26 DIAGNOSIS — E785 Hyperlipidemia, unspecified: Secondary | ICD-10-CM

## 2015-11-26 DIAGNOSIS — I10 Essential (primary) hypertension: Secondary | ICD-10-CM

## 2015-11-26 DIAGNOSIS — I2510 Atherosclerotic heart disease of native coronary artery without angina pectoris: Secondary | ICD-10-CM

## 2015-12-12 ENCOUNTER — Other Ambulatory Visit: Payer: Self-pay | Admitting: Cardiovascular Disease

## 2015-12-12 NOTE — Telephone Encounter (Signed)
Pharmacy requesting a refill on Pepcid 20 mg tablet. Would you like to refill? Please advise

## 2015-12-13 NOTE — Telephone Encounter (Signed)
OK to refill

## 2015-12-13 NOTE — Progress Notes (Signed)
Chief Complaint  Patient presents with  . Coronary Artery Disease    History of Present Illness: 65 yo male with h/o CAD, hyperlipidemia here today for follow up. In April 2010 he had a non-ST elevation MI treated with a DES to the LAD. His ejection fraction was initially 40% but this improved to 60%. He was admitted to [facility] 08/31/12 with c/o chest pain. Cardiac markers were negative. Stress myoview 09/01/12 with LVEF=55%, no ischemia. He had LE edema with Norvasc. Cozaar was added. He did not tolerate high dose statin therapy. Zocor lowered to 20 mg per day due to muscle aches. He has tolerated the 20 mg daily dose.   He is here today for follow up. No chest discomfort or SOB. He has been active. He works in the yard. He has retired and is now Environmental education officer. He is running 5K races.   Primary Care Physician: Lelon Huh, MD  Past Medical History:  Diagnosis Date  . Coronary artery disease    a. s/p NSTEMI with DES to LAD 06/2008.  Marland Kitchen HTN (hypertension)   . Hyperlipidemia   . Kidney stones   . LV dysfunction    a. EF 40% at time of cath 06/2008, improved to normal on f/u echo.    Past Surgical History:  Procedure Laterality Date  . CAROTID STENT  2010    Current Outpatient Prescriptions  Medication Sig Dispense Refill  . aspirin 81 MG tablet Take 1 tablet (81 mg total) by mouth daily. 30 tablet 6  . carvedilol (COREG) 6.25 MG tablet TAKE 1 TABLET BY MOUTH TWICE A DAY WITH A MEAL 180 tablet 3  . famotidine (PEPCID) 20 MG tablet Take 1 tablet (20 mg total) by mouth 2 (two) times daily. 180 tablet 3  . hydroxychloroquine (PLAQUENIL) 200 MG tablet Take 200 mg by mouth daily.  6  . losartan (COZAAR) 50 MG tablet Take 1 tablet (50 mg total) by mouth daily. 90 tablet 3  . nitroGLYCERIN (NITROSTAT) 0.4 MG SL tablet TAKE 1 TABLET UNDER THE TONGUE EVERY 5 MINUTES FOR CHEST PAIN,* MAY REPEAT UP TO 3 TIMES 25 tablet 6  . simvastatin (ZOCOR) 20 MG tablet Take 1 tablet (20 mg total)  by mouth daily. 90 tablet 3   No current facility-administered medications for this visit.     No Known Allergies  Social History   Social History  . Marital status: Married    Spouse name: N/A  . Number of children: 2  . Years of education: N/A   Occupational History  . MAINTANCE SERVICE    Social History Main Topics  . Smoking status: Never Smoker  . Smokeless tobacco: Not on file  . Alcohol use 1.5 oz/week    3 drink(s) per week  . Drug use: No  . Sexual activity: Not on file   Other Topics Concern  . 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 Heart attack Father 94  . Diabetes Brother     over weight    Review of Systems:  As stated in the HPI and otherwise negative.   BP 136/84   Pulse 61   Ht 6\' 1"  (1.854 m)   Wt 225 lb 1.9 oz (102.1 kg)  BMI 29.70 kg/m   Physical Examination: General: Well developed, well nourished, NAD  HEENT: OP clear, mucus membranes moist  SKIN: warm, dry. No rashes. Neuro: No focal deficits  Musculoskeletal: Muscle strength 5/5 all ext  Psychiatric: Mood and affect normal  Neck: No JVD, no carotid bruits, no thyromegaly, no lymphadenopathy.  Lungs:Clear bilaterally, no wheezes, rhonci, crackles Cardiovascular: Regular rate and rhythm. No murmurs, gallops or rubs. Abdomen:Soft. Bowel sounds present. Non-tender.  Extremities: No lower extremity edema. Pulses are 2 + in the bilateral DP/PT.  Stress myoview 09/01/12: Spect: Small anterior septal fixed defect on the short axis views predominantly. Suspect attenuation artifact versus a small area of scarring. No definite inducible or reversible ischemia with exercise stress. Wall motion: Grossly normal wall motion. Ejection fraction: Calculated Q G S ejection fraction is  55%. IMPRESSION: No definite inducible ischemia with exercise stress.  EKG:  EKG is ordered today. The ekg ordered today demonstrates  NSR, rate 61 bpm. Poor R wave progression precordial leads. T wave inversion inferior leads, unchanged.   Recent Labs: No results found for requested labs within last 8760 hours.   Lipid Panel    Component Value Date/Time   CHOL 104 10/20/2014 0830   TRIG 143.0 10/20/2014 0830   HDL 39.50 10/20/2014 0830   CHOLHDL 3 10/20/2014 0830   VLDL 28.6 10/20/2014 0830   LDLCALC 36 10/20/2014 0830     Wt Readings from Last 3 Encounters:  12/14/15 225 lb 1.9 oz (102.1 kg)  10/17/15 225 lb (102.1 kg)  10/20/14 234 lb (106.1 kg)     Other studies Reviewed: Additional studies/ records that were reviewed today include: . Review of the above records demonstrates:    Assessment and Plan:   1. CAD: He has had no chest pain suggestive of angina. Stress myoview without ischemia 09/01/12. He is on ASA, statin and beta blocker. No changes today.   2. HYPERLIPIDEMIA: Continue statin. Lipids well controlled. Will check lipids and LFTs today.  3. HTN: BP controlled. No changes.  Continue current meds.   Current medicines are reviewed at length with the patient today.  The patient does not have concerns regarding medicines.  The following changes have been made:  no change  Labs/ tests ordered today include:   Orders Placed This Encounter  Procedures  . Lipid Profile  . Hepatic function panel  . EKG 12-Lead     Disposition:   FU with me in 12  months   Signed, Lauree Chandler, MD 12/14/2015 9:02 AM    [facility] [address], [place], [place]  [address] Phone: [phone]; Fax: [phone]

## 2015-12-14 ENCOUNTER — Encounter: Payer: Self-pay | Admitting: Cardiovascular Disease

## 2015-12-14 ENCOUNTER — Ambulatory Visit (INDEPENDENT_AMBULATORY_CARE_PROVIDER_SITE_OTHER): Payer: Medicare Other | Admitting: Cardiovascular Disease

## 2015-12-14 VITALS — BP 136/84 | HR 61 | Ht 73.0 in | Wt 225.1 lb

## 2015-12-14 DIAGNOSIS — E7800 Pure hypercholesterolemia, unspecified: Secondary | ICD-10-CM | POA: Diagnosis not present

## 2015-12-14 DIAGNOSIS — I2510 Atherosclerotic heart disease of native coronary artery without angina pectoris: Secondary | ICD-10-CM

## 2015-12-14 DIAGNOSIS — I10 Essential (primary) hypertension: Secondary | ICD-10-CM | POA: Diagnosis not present

## 2015-12-14 DIAGNOSIS — E7849 Other hyperlipidemia: Secondary | ICD-10-CM

## 2015-12-14 DIAGNOSIS — E784 Other hyperlipidemia: Secondary | ICD-10-CM | POA: Diagnosis not present

## 2015-12-14 LAB — HEPATIC FUNCTION PANEL
ALBUMIN: 4.3 g/dL (ref 3.6–5.1)
ALK PHOS: 60 U/L (ref 40–115)
ALT: 29 U/L (ref 9–46)
AST: 25 U/L (ref 10–35)
BILIRUBIN DIRECT: 0.1 mg/dL (ref <=0.2)
BILIRUBIN INDIRECT: 0.5 mg/dL (ref 0.2–1.2)
BILIRUBIN TOTAL: 0.6 mg/dL (ref 0.2–1.2)
Total Protein: 6.4 g/dL (ref 6.1–8.1)

## 2015-12-14 LAB — LIPID PANEL
Cholesterol: 118 mg/dL — ABNORMAL LOW (ref 125–200)
HDL: 40 mg/dL (ref >=40)
LDL CALC: 49 mg/dL (ref <130)
Total CHOL/HDL Ratio: 3 Ratio (ref <=5.0)
Triglycerides: 145 mg/dL (ref <150)
VLDL: 29 mg/dL (ref <30)

## 2015-12-14 MED ORDER — SIMVASTATIN 20 MG PO TABS: 20.0000 mg | ORAL_TABLET | Freq: Every day | ORAL | 3 refills | Status: DC

## 2015-12-14 MED ORDER — FAMOTIDINE 20 MG PO TABS: 20.0000 mg | ORAL_TABLET | Freq: Two times a day (BID) | ORAL | 3 refills | Status: DC

## 2015-12-14 MED ORDER — NITROGLYCERIN 0.4 MG SL SUBL: SUBLINGUAL_TABLET | SUBLINGUAL | 6 refills | Status: DC

## 2015-12-14 MED ORDER — CARVEDILOL 6.25 MG PO TABS: ORAL_TABLET | ORAL | 3 refills | Status: DC

## 2015-12-14 MED ORDER — LOSARTAN POTASSIUM 50 MG PO TABS: 50.0000 mg | ORAL_TABLET | Freq: Every day | ORAL | 3 refills | Status: DC

## 2015-12-14 NOTE — Patient Instructions (Signed)
Medication Instructions:  Your physician recommends that you continue on your current medications as directed. Please refer to the Current Medication list given to you today.   Labwork: Lab work to be done today--lipid and liver profiles  Testing/Procedures: none  Follow-Up: Your physician wants you to follow-up in: 12 months.  You will receive a reminder letter in the mail two months in advance. If you don't receive a letter, please call our office to schedule the follow-up appointment.   Any Other Special Instructions Will Be Listed Below (If Applicable).     If you need a refill on your cardiac medications before your next appointment, please call your pharmacy.

## 2015-12-30 ENCOUNTER — Other Ambulatory Visit: Payer: Self-pay | Admitting: Cardiovascular Disease

## 2015-12-30 DIAGNOSIS — E785 Hyperlipidemia, unspecified: Secondary | ICD-10-CM

## 2015-12-30 DIAGNOSIS — I2510 Atherosclerotic heart disease of native coronary artery without angina pectoris: Secondary | ICD-10-CM

## 2015-12-30 DIAGNOSIS — I10 Essential (primary) hypertension: Secondary | ICD-10-CM

## 2015-12-30 NOTE — Telephone Encounter (Signed)
simvastatin (ZOCOR) 20 MG tablet  Medication  Date: 12/14/2015 Department: [address] Office Ordering/Authorizing: Burnell Blanks, MD  Order Providers   Prescribing Provider Encounter Provider  Burnell Blanks, MD Burnell Blanks, MD  Medication Detail    Disp Refills Start End   simvastatin (ZOCOR) 20 MG tablet 90 tablet 3 12/14/2015    Sig - Route: Take 1 tablet (20 mg total) by mouth daily. - Oral   E-Prescribing Status: Receipt confirmed by pharmacy (12/14/2015 8:42 AM EDT)   Associated Diagnoses   Atherosclerosis of native coronary artery of native heart without angina pectoris     Essential hypertension     Other hyperlipidemia     Pharmacy   [id number] - [place],  - [place]

## 2016-01-09 DIAGNOSIS — M7061 Trochanteric bursitis, right hip: Secondary | ICD-10-CM | POA: Diagnosis not present

## 2016-01-09 DIAGNOSIS — M150 Primary generalized (osteo)arthritis: Secondary | ICD-10-CM | POA: Diagnosis not present

## 2016-02-01 ENCOUNTER — Ambulatory Visit (INDEPENDENT_AMBULATORY_CARE_PROVIDER_SITE_OTHER): Payer: Medicare Other | Admitting: Family Medicine

## 2016-02-01 VITALS — BP 132/82 | HR 68 | Temp 97.6°F | Ht 73.0 in | Wt 228.4 lb

## 2016-02-01 DIAGNOSIS — Z0000 Encounter for general adult medical examination without abnormal findings: Secondary | ICD-10-CM | POA: Diagnosis not present

## 2016-02-01 DIAGNOSIS — Z23 Encounter for immunization: Secondary | ICD-10-CM | POA: Diagnosis not present

## 2016-02-01 NOTE — Patient Instructions (Signed)
Jonathan Sharp , Thank you for taking time to come for your Medicare Wellness Visit. I appreciate your ongoing commitment to your health goals. Please review the following plan we discussed and let me know if I can assist you in the future.   These are the goals we discussed: Goals    . Increase water intake          Starting 02/01/16, I will increase my water intake to 4 glasses a day.       This is a list of the screening recommended for you and due dates:  Health Maintenance  Topic Date Due  .  Hepatitis C: One time screening is recommended by Center for Disease Control  (CDC) for  adults born from [birth year] through 1967.   03/11/2016*  . Shingles Vaccine  01/31/2026*  . Pneumonia vaccines (2 of 2 - PPSV23) 01/31/2017  . Tetanus Vaccine  07/17/2017  . Colon Cancer Screening  07/18/2023  . Flu Shot  Completed  *Topic was postponed. The date shown is not the original due date.   Preventive Care for Adults  A healthy lifestyle and preventive care can promote health and wellness. Preventive health guidelines for adults include the following key practices.  . A routine yearly physical is a good way to check with your health care provider about your health and preventive screening. It is a chance to share any concerns and updates on your health and to receive a thorough exam.  . Visit your dentist for a routine exam and preventive care every 6 months. Brush your teeth twice a day and floss once a day. Good oral hygiene prevents tooth decay and gum disease.  . The frequency of eye exams is based on your age, health, family medical history, use  of contact lenses, and other factors. Follow your health care provider's ecommendations for frequency of eye exams.  . Eat a healthy diet. Foods like vegetables, fruits, whole grains, low-fat dairy products, and lean protein foods contain the nutrients you need without too many calories. Decrease your intake of foods high in solid fats, added sugars, and  salt. Eat the right amount of calories for you. Get information about a proper diet from your health care provider, if necessary.  . Regular physical exercise is one of the most important things you can do for your health. Most adults should get at least 150 minutes of moderate-intensity exercise (any activity that increases your heart rate and causes you to sweat) each week. In addition, most adults need muscle-strengthening exercises on 2 or more days a week.  Silver Sneakers may be a benefit available to you. To determine eligibility, you may visit the website: www.silversneakers.com or contact program at 281-038-5261 Mon-Fri between 8AM-8PM.   . Maintain a healthy weight. The body mass index (BMI) is a screening tool to identify possible weight problems. It provides an estimate of body fat based on height and weight. Your health care provider can find your BMI and can help you achieve or maintain a healthy weight.   For adults 20 years and older: ? A BMI below 18.5 is considered underweight. ? A BMI of 18.5 to 24.9 is normal. ? A BMI of 25 to 29.9 is considered overweight. ? A BMI of 30 and above is considered obese.   . Maintain normal blood lipids and cholesterol levels by exercising and minimizing your intake of saturated fat. Eat a balanced diet with plenty of fruit and vegetables. Blood tests for lipids and  cholesterol should begin at age 71 and be repeated every 5 years. If your lipid or cholesterol levels are high, you are over 50, or you are at high risk for heart disease, you may need your cholesterol levels checked more frequently. Ongoing high lipid and cholesterol levels should be treated with medicines if diet and exercise are not working.  . If you smoke, find out from your health care provider how to quit. If you do not use tobacco, please do not start.  . If you choose to drink alcohol, please do not consume more than 2 drinks per day. One drink is considered to be 12 ounces  (355 mL) of beer, 5 ounces (148 mL) of wine, or 1.5 ounces (44 mL) of liquor.  . If you are 68-48 years old, ask your health care provider if you should take aspirin to prevent strokes.  . Use sunscreen. Apply sunscreen liberally and repeatedly throughout the day. You should seek shade when your shadow is shorter than you. Protect yourself by wearing long sleeves, pants, a wide-brimmed hat, and sunglasses year round, whenever you are outdoors.  . Once a month, do a whole body skin exam, using a mirror to look at the skin on your back. Tell your health care provider of new moles, moles that have irregular borders, moles that are larger than a pencil eraser, or moles that have changed in shape or color.

## 2016-02-01 NOTE — Progress Notes (Signed)
Subjective:   Jonathan Sharp is a 65 y.o. male who presents for an Initial Medicare Annual Wellness Visit.  Review of Systems N/A  Cardiac Risk Factors include: advanced age (>76men, >82 women);dyslipidemia;hypertension;male gender    Objective:    Today's Vitals   02/01/16 1453  BP: 132/82  Pulse: 68  Temp: 97.6 F (36.4 C)  TempSrc: Oral  Weight: 228 lb 6 oz (103.6 kg)  Height: 6\' 1"  (1.854 m)  PainSc: 0-No pain   Body mass index is 30.13 kg/m.  Current Medications (verified) Outpatient Encounter Prescriptions as of 02/01/2016  Medication Sig  . aspirin 81 MG tablet Take 1 tablet (81 mg total) by mouth daily.  . carvedilol (COREG) 6.25 MG tablet TAKE 1 TABLET BY MOUTH TWICE A DAY WITH A MEAL  . famotidine (PEPCID) 20 MG tablet Take 1 tablet (20 mg total) by mouth 2 (two) times daily.  . hydroxychloroquine (PLAQUENIL) 200 MG tablet Take 200 mg by mouth daily.  Marland Kitchen losartan (COZAAR) 50 MG tablet Take 1 tablet (50 mg total) by mouth daily.  . nitroGLYCERIN (NITROSTAT) 0.4 MG SL tablet TAKE 1 TABLET UNDER THE TONGUE EVERY 5 MINUTES FOR CHEST PAIN,* MAY REPEAT UP TO 3 TIMES  . simvastatin (ZOCOR) 20 MG tablet Take 1 tablet (20 mg total) by mouth daily.   No facility-administered encounter medications on file as of 02/01/2016.     Allergies (verified) Patient has no known allergies.   History: Past Medical History:  Diagnosis Date  . Coronary artery disease    a. s/p NSTEMI with DES to LAD 06/2008.  Marland Kitchen HTN (hypertension)   . Hyperlipidemia   . Kidney stones   . LV dysfunction    a. EF 40% at time of cath 06/2008, improved to normal on f/u echo.   Past Surgical History:  Procedure Laterality Date  . CAROTID STENT  2010   Family History  Problem Relation Age of Onset  . Heart attack Father 33  . Diabetes Brother     over weight   Social History   Occupational History  . MAINTANCE SERVICE    Social History Main Topics  . Smoking status: Never Smoker  .  Smokeless tobacco: Never Used  . Alcohol use 3.6 oz/week    3 Standard drinks or equivalent, 3 Shots of liquor per week  . Drug use: No  . Sexual activity: Not on file   Tobacco Counseling Counseling given: Not Answered   Activities of Daily Living In your present state of health, do you have any difficulty performing the following activities: 02/01/2016  Hearing? N  Vision? Y  Difficulty concentrating or making decisions? N  Walking or climbing stairs? N  Dressing or bathing? N  Doing errands, shopping? N  Preparing Food and eating ? N  Using the Toilet? N  In the past six months, have you accidently leaked urine? N  Do you have problems with loss of bowel control? N  Managing your Medications? N  Managing your Finances? N  Housekeeping or managing your Housekeeping? N  Some recent data might be hidden    Immunizations and Health Maintenance There is no immunization history for the selected administration types on file for this patient. There are no preventive care reminders to display for this patient.  Patient Care Team: Birdie Sons, MD as PCP - General (Family Medicine) Burnell Blanks, MD as Consulting Physician (Cardiology) Julieanne Manson Leeanne Mannan., MD as Consulting Physician (Rheumatology)  Indicate any recent  Medical Services you may have received from other than [facility] providers in the past year (date may be approximate).    Assessment:   This is a routine wellness examination for Jonathan Sharp.   Hearing/Vision screen Vision Screening Comments: Pt sees Dr Leeanne Deed for vision checks every 1-2 years. Patient will be changing to [facility] soon.  Dietary issues and exercise activities discussed: Current Exercise Habits: The patient does not participate in regular exercise at present (Runs in 5Ks occasionally, does training for these races 7 weeks in advance for 3 days a week.)  Goals    . Increase water intake          Starting 02/01/16, I will increase  my water intake to 4 glasses a day.      Depression Screen PHQ 2/9 Scores 02/01/2016  PHQ - 2 Score 0    Fall Risk Fall Risk  02/01/2016  Falls in the past year? No    Cognitive Function:     6CIT Screen 02/01/2016  What Year? 0 points  What month? 0 points  What time? 0 points  Count back from 20 0 points  Months in reverse 0 points  Repeat phrase 0 points  Total Score 0    Screening Tests Health Maintenance  Topic Date Due  . Hepatitis C Screening  03/11/2016 (Originally 12-Mar-1951)  . ZOSTAVAX  01/31/2026 (Originally 06/07/2008)  . PNA vac Low Risk Adult (2 of 2 - PPSV23) 01/31/2017  . TETANUS/TDAP  07/17/2017  . COLONOSCOPY  07/18/2023  . INFLUENZA VACCINE  Completed        Plan:  I have personally reviewed and addressed the Medicare Annual Wellness questionnaire and have noted the following in the patient's chart:  A. Medical and social history B. Use of alcohol, tobacco or illicit drugs  C. Current medications and supplements D. Functional ability and status E.  Nutritional status F.  Physical activity G. Advance directives H. List of other physicians I.  Hospitalizations, surgeries, and ER visits in previous 12 months J.  [place] such as hearing and vision if needed, cognitive and depression L. Referrals and appointments - none  In addition, I have reviewed and discussed with patient certain preventive protocols, quality metrics, and best practice recommendations. A written personalized care plan for preventive services as well as general preventive health recommendations were provided to patient.  See attached scanned questionnaire for additional information.   Signed,  Fabio Neighbors, LPN Nurse Health Advisor  MD Recommendations: Follow up on Hepatitis C screening, patient declined today.

## 2016-04-19 ENCOUNTER — Telehealth: Payer: Self-pay | Admitting: Family Medicine

## 2016-04-19 NOTE — Telephone Encounter (Signed)
Called Pt to schedule AWV with [facility] - knb °

## 2016-06-25 DIAGNOSIS — Z79899 Other long term (current) drug therapy: Secondary | ICD-10-CM | POA: Diagnosis not present

## 2016-07-09 DIAGNOSIS — M150 Primary generalized (osteo)arthritis: Secondary | ICD-10-CM | POA: Diagnosis not present

## 2016-07-09 DIAGNOSIS — M7061 Trochanteric bursitis, right hip: Secondary | ICD-10-CM | POA: Diagnosis not present

## 2016-07-09 DIAGNOSIS — M1990 Unspecified osteoarthritis, unspecified site: Secondary | ICD-10-CM | POA: Diagnosis not present

## 2016-07-31 DIAGNOSIS — M7061 Trochanteric bursitis, right hip: Secondary | ICD-10-CM | POA: Diagnosis not present

## 2016-08-07 DIAGNOSIS — M7061 Trochanteric bursitis, right hip: Secondary | ICD-10-CM | POA: Diagnosis not present

## 2016-08-09 DIAGNOSIS — M7061 Trochanteric bursitis, right hip: Secondary | ICD-10-CM | POA: Diagnosis not present

## 2016-08-14 DIAGNOSIS — M7061 Trochanteric bursitis, right hip: Secondary | ICD-10-CM | POA: Diagnosis not present

## 2016-08-16 DIAGNOSIS — M7061 Trochanteric bursitis, right hip: Secondary | ICD-10-CM | POA: Diagnosis not present

## 2016-08-21 DIAGNOSIS — M7061 Trochanteric bursitis, right hip: Secondary | ICD-10-CM | POA: Diagnosis not present

## 2016-08-23 DIAGNOSIS — M7061 Trochanteric bursitis, right hip: Secondary | ICD-10-CM | POA: Diagnosis not present

## 2016-08-28 DIAGNOSIS — M7061 Trochanteric bursitis, right hip: Secondary | ICD-10-CM | POA: Diagnosis not present

## 2016-08-30 DIAGNOSIS — M7061 Trochanteric bursitis, right hip: Secondary | ICD-10-CM | POA: Diagnosis not present

## 2016-09-03 DIAGNOSIS — M7061 Trochanteric bursitis, right hip: Secondary | ICD-10-CM | POA: Diagnosis not present

## 2016-09-03 DIAGNOSIS — M150 Primary generalized (osteo)arthritis: Secondary | ICD-10-CM | POA: Diagnosis not present

## 2016-09-03 DIAGNOSIS — M1990 Unspecified osteoarthritis, unspecified site: Secondary | ICD-10-CM | POA: Diagnosis not present

## 2016-09-04 DIAGNOSIS — H3530 Unspecified macular degeneration: Secondary | ICD-10-CM | POA: Diagnosis not present

## 2016-09-04 DIAGNOSIS — H25813 Combined forms of age-related cataract, bilateral: Secondary | ICD-10-CM | POA: Diagnosis not present

## 2016-09-04 DIAGNOSIS — H53002 Unspecified amblyopia, left eye: Secondary | ICD-10-CM | POA: Diagnosis not present

## 2016-09-04 DIAGNOSIS — M7061 Trochanteric bursitis, right hip: Secondary | ICD-10-CM | POA: Diagnosis not present

## 2016-09-11 DIAGNOSIS — M7061 Trochanteric bursitis, right hip: Secondary | ICD-10-CM | POA: Diagnosis not present

## 2016-09-18 DIAGNOSIS — M7061 Trochanteric bursitis, right hip: Secondary | ICD-10-CM | POA: Diagnosis not present

## 2016-10-04 DIAGNOSIS — M7061 Trochanteric bursitis, right hip: Secondary | ICD-10-CM | POA: Diagnosis not present

## 2016-10-15 ENCOUNTER — Encounter: Payer: Self-pay | Admitting: Family Medicine

## 2016-10-15 ENCOUNTER — Ambulatory Visit (INDEPENDENT_AMBULATORY_CARE_PROVIDER_SITE_OTHER): Payer: Medicare Other | Admitting: Family Medicine

## 2016-10-15 VITALS — BP 120/80 | HR 72 | Temp 97.4°F | Resp 16 | Ht 73.0 in | Wt 225.0 lb

## 2016-10-15 DIAGNOSIS — I2510 Atherosclerotic heart disease of native coronary artery without angina pectoris: Secondary | ICD-10-CM

## 2016-10-15 DIAGNOSIS — Z01818 Encounter for other preprocedural examination: Secondary | ICD-10-CM

## 2016-10-15 DIAGNOSIS — I10 Essential (primary) hypertension: Secondary | ICD-10-CM | POA: Diagnosis not present

## 2016-10-15 NOTE — Progress Notes (Signed)
Patient: Jonathan Sharp Male    DOB: 08/22/1950   66 y.o.   MRN: [id number] Visit Date: 10/15/2016  Today's Provider: Lelon Huh, MD   Chief Complaint  Patient presents with  . Medical Clearance   Subjective:    HPI Surgical Clearance:  Patient is scheduled to have cataract extraction or right eye with insertion of intraocular lenses on 10/22/2016. Surgeon will be Dr. Sudie Grumbling of [facility] at [facility]. He feels well with no complaints. Anticipates local anesthesia. His most recent surgery was carotid artery stenting in 2010 and had no surgical or anesthetic complications. Has no family history of adverse reactions to anesthesia. His last follow up with Dr. Angelena Form was October 2017 and doing well from cardiac standpoint.   Past Medical History:  Diagnosis Date  . Coronary artery disease    a. s/p NSTEMI with DES to LAD 06/2008.  Marland Kitchen HTN (hypertension)   . Hyperlipidemia   . Kidney stones   . LV dysfunction    a. EF 40% at time of cath 06/2008, improved to normal on f/u echo.   Past Surgical History:  Procedure Laterality Date  . CAROTID STENT  2010   Patient Active Problem List   Diagnosis Date Noted  . HTN (hypertension) 09/01/2012  . HYPERLIPIDEMIA-MIXED 06/28/2008  . CAD, NATIVE VESSEL 06/28/2008  . NEPHROLITHIASIS, HX OF 06/28/2008       No Known Allergies   Current Outpatient Prescriptions:  .  aspirin 81 MG tablet, Take 1 tablet (81 mg total) by mouth daily., Disp: 30 tablet, Rfl: 6 .  carvedilol (COREG) 6.25 MG tablet, TAKE 1 TABLET BY MOUTH TWICE A DAY WITH A MEAL, Disp: 180 tablet, Rfl: 3 .  famotidine (PEPCID) 20 MG tablet, Take 1 tablet (20 mg total) by mouth 2 (two) times daily., Disp: 180 tablet, Rfl: 3 .  hydroxychloroquine (PLAQUENIL) 200 MG tablet, Take 200 mg by mouth daily., Disp: , Rfl: 6 .  losartan (COZAAR) 50 MG tablet, Take 1 tablet (50 mg total) by mouth daily., Disp: 90 tablet, Rfl: 3 .  nitroGLYCERIN (NITROSTAT) 0.4 MG SL  tablet, TAKE 1 TABLET UNDER THE TONGUE EVERY 5 MINUTES FOR CHEST PAIN,* MAY REPEAT UP TO 3 TIMES, Disp: 25 tablet, Rfl: 6 .  simvastatin (ZOCOR) 20 MG tablet, Take 1 tablet (20 mg total) by mouth daily., Disp: 90 tablet, Rfl: 3  Review of Systems  Constitutional: Negative for appetite change, chills, diaphoresis, fatigue and fever.  Respiratory: Negative for chest tightness, shortness of breath and wheezing.   Cardiovascular: Negative for chest pain and palpitations.  Gastrointestinal: Negative for abdominal pain, nausea and vomiting.    Social History  Substance Use Topics  . Smoking status: Never Smoker  . Smokeless tobacco: Never Used  . Alcohol use 3.6 oz/week    3 Standard drinks or equivalent, 3 Shots of liquor per week   Objective:   BP 120/80 (BP Location: Left Arm, Patient Position: Sitting, Cuff Size: Large)   Pulse 72   Temp (!) 97.4 F (36.3 C) (Oral)   Resp 16   Ht 6\' 1"  (1.854 m)   Wt 225 lb (102.1 kg)   SpO2 96% Comment: room air  BMI 29.69 kg/m  Vitals:   10/15/16 1103  BP: 120/80  Pulse: 72  Resp: 16  Temp: (!) 97.4 F (36.3 C)  TempSrc: Oral  SpO2: 96%  Weight: 225 lb (102.1 kg)  Height: 6\' 1"  (1.854 m)  Physical Exam   General Appearance:    Alert, cooperative, no distress  Eyes:    PERRL, conjunctiva/corneas clear, EOM's intact       Lungs:     Clear to auscultation bilaterally, respirations unlabored  Heart:    Regular rate and rhythm  Neurologic:   Awake, alert, oriented x 3. No apparent focal neurological           defect.           Assessment & Plan:     1. Preop examination Patient has no absolute or relative contraindications to planned surgery and anesthesia. He is at low risk for cardiac and pulmonary complications. Cardia disease is optimally managed.  .   2. Atherosclerosis of native coronary artery of native heart without angina pectoris Asymptomatic. Compliant with medication.  Continue aggressive risk factor modification.    - EKG 12-Lead  3. Essential hypertension Well controlled.  Continue current medications.   - EKG 12-Lead       Lelon Huh, MD  [facility]

## 2016-10-22 ENCOUNTER — Encounter: Payer: Self-pay | Admitting: Family Medicine

## 2016-10-22 DIAGNOSIS — I252 Old myocardial infarction: Secondary | ICD-10-CM | POA: Diagnosis not present

## 2016-10-22 DIAGNOSIS — H2511 Age-related nuclear cataract, right eye: Secondary | ICD-10-CM | POA: Diagnosis not present

## 2016-10-22 DIAGNOSIS — I2510 Atherosclerotic heart disease of native coronary artery without angina pectoris: Secondary | ICD-10-CM | POA: Diagnosis not present

## 2016-10-22 DIAGNOSIS — Z955 Presence of coronary angioplasty implant and graft: Secondary | ICD-10-CM | POA: Diagnosis not present

## 2016-10-22 DIAGNOSIS — Z888 Allergy status to other drugs, medicaments and biological substances status: Secondary | ICD-10-CM | POA: Diagnosis not present

## 2016-10-22 DIAGNOSIS — I10 Essential (primary) hypertension: Secondary | ICD-10-CM | POA: Diagnosis not present

## 2016-10-22 DIAGNOSIS — K219 Gastro-esophageal reflux disease without esophagitis: Secondary | ICD-10-CM | POA: Diagnosis not present

## 2016-10-22 DIAGNOSIS — E7800 Pure hypercholesterolemia, unspecified: Secondary | ICD-10-CM | POA: Diagnosis not present

## 2016-10-22 DIAGNOSIS — Z886 Allergy status to analgesic agent status: Secondary | ICD-10-CM | POA: Diagnosis not present

## 2016-10-22 HISTORY — PX: INTRAOCULAR LENS INSERTION: SHX110

## 2016-11-05 DIAGNOSIS — H2512 Age-related nuclear cataract, left eye: Secondary | ICD-10-CM | POA: Diagnosis not present

## 2016-11-05 DIAGNOSIS — Z888 Allergy status to other drugs, medicaments and biological substances status: Secondary | ICD-10-CM | POA: Diagnosis not present

## 2016-11-05 DIAGNOSIS — I2510 Atherosclerotic heart disease of native coronary artery without angina pectoris: Secondary | ICD-10-CM | POA: Diagnosis not present

## 2016-11-05 DIAGNOSIS — I252 Old myocardial infarction: Secondary | ICD-10-CM | POA: Diagnosis not present

## 2016-11-05 DIAGNOSIS — E7800 Pure hypercholesterolemia, unspecified: Secondary | ICD-10-CM | POA: Diagnosis not present

## 2016-11-05 DIAGNOSIS — Z886 Allergy status to analgesic agent status: Secondary | ICD-10-CM | POA: Diagnosis not present

## 2016-11-05 DIAGNOSIS — I10 Essential (primary) hypertension: Secondary | ICD-10-CM | POA: Diagnosis not present

## 2016-11-05 DIAGNOSIS — Z955 Presence of coronary angioplasty implant and graft: Secondary | ICD-10-CM | POA: Diagnosis not present

## 2016-11-25 ENCOUNTER — Other Ambulatory Visit: Payer: Self-pay | Admitting: Cardiovascular Disease

## 2016-11-25 DIAGNOSIS — E7849 Other hyperlipidemia: Secondary | ICD-10-CM

## 2016-11-25 DIAGNOSIS — I10 Essential (primary) hypertension: Secondary | ICD-10-CM

## 2016-11-25 DIAGNOSIS — I2510 Atherosclerotic heart disease of native coronary artery without angina pectoris: Secondary | ICD-10-CM

## 2016-11-27 ENCOUNTER — Telehealth: Payer: Self-pay | Admitting: Family Medicine

## 2016-12-05 ENCOUNTER — Encounter: Payer: Self-pay | Admitting: Cardiovascular Disease

## 2016-12-10 NOTE — Telephone Encounter (Signed)
AWV scheduled 

## 2016-12-12 NOTE — Progress Notes (Signed)
Chief Complaint  Patient presents with  . Follow-up    CAD    History of Present Illness: 66 yo male with h/o CAD, hyperlipidemia here today for follow up. In April 2010 he had a non-ST elevation MI treated with a DES in the LAD. His ejection fraction was initially 40% but this improved to 60%. Stress myoview 09/01/12 in setting of admission for chest pain with LVEF=55%, no ischemia. He had LE edema with Norvasc. He did not tolerate high dose statin therapy. Zocor lowered to 20 mg per day due to muscle aches. He has tolerated the 20 mg daily dose.   He is here today for follow up. The patient denies any chest pain, dyspnea, palpitations, lower extremity edema, orthopnea, PND, dizziness, near syncope or syncope. 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     Primary Care Physician: Birdie Sons, MD  Past Medical History:  Diagnosis Date  . Coronary artery disease    a. s/p NSTEMI with DES to LAD 06/2008.  Marland Kitchen HTN (hypertension)   . Hyperlipidemia   . Kidney stones   . LV dysfunction    a. EF 40% at time of cath 06/2008, improved to normal on f/u echo.    Past Surgical History:  Procedure Laterality Date  . CAROTID STENT  2010  . INTRAOCULAR LENS INSERTION Right 10/22/2016   [facility]    Current Outpatient Prescriptions  Medication Sig Dispense Refill  . aspirin 81 MG tablet Take 1 tablet (81 mg total) by mouth daily. 30 tablet 6  . carvedilol (COREG) 6.25 MG tablet TAKE 1 TABLET BY MOUTH TWICE A DAY WITH A MEAL 180 tablet 3  . famotidine (PEPCID) 20 MG tablet Take 1 tablet (20 mg total) by mouth 2 (two) times daily. 180 tablet 3  . hydroxychloroquine (PLAQUENIL) 200 MG tablet Take 400 mg by mouth daily.   6  . losartan (COZAAR) 50 MG tablet Take 1 tablet (50 mg total) by mouth daily. 90 tablet 3  . nitroGLYCERIN (NITROSTAT) 0.4 MG SL tablet TAKE 1 TABLET UNDER THE TONGUE EVERY 5 MINUTES FOR CHEST PAIN,* MAY REPEAT UP TO 3 TIMES 25 tablet 6  . simvastatin (ZOCOR) 20 MG  tablet TAKE 1 TABLET BY MOUTH DAILY. 90 tablet 0   No current facility-administered medications for this visit.     No Known Allergies  Social History   Social History  . Marital status: Married    Spouse name: N/A  . Number of children: 2  . Years of education: N/A   Occupational History  . MAINTANCE SERVICE    Social History Main Topics  . Smoking status: Never Smoker  . Smokeless tobacco: Never Used  . Alcohol use 3.6 oz/week    3 Standard drinks or equivalent, 3 Shots of liquor per week  . Drug use: No  . Sexual activity: Not on file   Other Topics Concern  . 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 Heart attack Father 76  . Diabetes Brother        over weight    Review of Systems:  As stated in the HPI and otherwise negative.   BP 124/80   Pulse 61   Ht  6\' 1"  (1.854 m)   Wt 226 lb (102.5 kg)   SpO2 96%   BMI 29.82 kg/m   Physical Examination:  General: Well developed, well nourished, NAD  HEENT: OP clear, mucus membranes moist  SKIN: warm, dry. No rashes. Neuro: No focal deficits  Musculoskeletal: Muscle strength 5/5 all ext  Psychiatric: Mood and affect normal  Neck: No JVD, no carotid bruits, no thyromegaly, no lymphadenopathy.  Lungs:Clear bilaterally, no wheezes, rhonci, crackles Cardiovascular: Regular rate and rhythm. No murmurs, gallops or rubs. Abdomen:Soft. Bowel sounds present. Non-tender.  Extremities: No lower extremity edema. Pulses are 2 + in the bilateral DP/PT.  Stress myoview 09/01/12: Spect: Small anterior septal fixed defect on the short axis views predominantly. Suspect attenuation artifact versus a small area of scarring. No definite inducible or reversible ischemia with exercise stress. Wall motion: Grossly normal wall  motion. Ejection fraction: Calculated Q G S ejection fraction is 55%. IMPRESSION: No definite inducible ischemia with exercise stress.  EKG:  EKG is ordered today. The ekg ordered today demonstrates     Recent Labs: No results found for requested labs within last 8760 hours.   Lipid Panel    Component Value Date/Time   CHOL 118 (L) 12/14/2015 0827   TRIG 145 12/14/2015 0827   HDL 40 12/14/2015 0827   CHOLHDL 3.0 12/14/2015 0827   VLDL 29 12/14/2015 0827   LDLCALC 49 12/14/2015 0827     Wt Readings from Last 3 Encounters:  12/13/16 226 lb (102.5 kg)  10/15/16 225 lb (102.1 kg)  02/01/16 228 lb 6 oz (103.6 kg)     Other studies Reviewed: Additional studies/ records that were reviewed today include: . Review of the above records demonstrates:    Assessment and Plan:   1. CAD without angina: He has no chest pain suggestive of angina. Will continue ASA, beta blocker and statin.    2. HYPERLIPIDEMIA: Lipids well controlled last year here. Lipids now followed in primary care and well controlled per pt. Continue statin.   3. HTN: BP controlled. No changes  Current medicines are reviewed at length with the patient today.  The patient does not have concerns regarding medicines.  The following changes have been made:  no change  Labs/ tests ordered today include:   No orders of the defined types were placed in this encounter.    Disposition:   FU with me in 12  months   Signed, Lauree Chandler, MD 12/13/2016 9:31 AM    [facility] [place], [place], [place]  [address] Phone: [phone]; Fax: [phone]

## 2016-12-13 ENCOUNTER — Encounter: Payer: Self-pay | Admitting: Cardiovascular Disease

## 2016-12-13 ENCOUNTER — Ambulatory Visit (INDEPENDENT_AMBULATORY_CARE_PROVIDER_SITE_OTHER): Payer: Medicare Other | Admitting: Cardiovascular Disease

## 2016-12-13 VITALS — BP 124/80 | HR 61 | Ht 73.0 in | Wt 226.0 lb

## 2016-12-13 DIAGNOSIS — E7800 Pure hypercholesterolemia, unspecified: Secondary | ICD-10-CM

## 2016-12-13 DIAGNOSIS — I2510 Atherosclerotic heart disease of native coronary artery without angina pectoris: Secondary | ICD-10-CM | POA: Diagnosis not present

## 2016-12-13 DIAGNOSIS — I10 Essential (primary) hypertension: Secondary | ICD-10-CM | POA: Diagnosis not present

## 2016-12-13 NOTE — Patient Instructions (Signed)

## 2017-01-22 ENCOUNTER — Ambulatory Visit: Payer: Medicare Other

## 2017-01-22 ENCOUNTER — Encounter: Payer: Medicare Other | Admitting: Family Medicine

## 2017-01-24 ENCOUNTER — Ambulatory Visit (INDEPENDENT_AMBULATORY_CARE_PROVIDER_SITE_OTHER): Payer: Medicare Other | Admitting: Family Medicine

## 2017-01-24 ENCOUNTER — Encounter: Payer: Self-pay | Admitting: *Deleted

## 2017-01-24 ENCOUNTER — Ambulatory Visit (INDEPENDENT_AMBULATORY_CARE_PROVIDER_SITE_OTHER): Payer: Medicare Other

## 2017-01-24 VITALS — BP 142/88 | HR 60 | Temp 97.9°F | Ht 73.0 in | Wt 232.0 lb

## 2017-01-24 DIAGNOSIS — Z0000 Encounter for general adult medical examination without abnormal findings: Secondary | ICD-10-CM | POA: Diagnosis not present

## 2017-01-24 DIAGNOSIS — Z6830 Body mass index (BMI) 30.0-30.9, adult: Secondary | ICD-10-CM | POA: Diagnosis not present

## 2017-01-24 DIAGNOSIS — Z1159 Encounter for screening for other viral diseases: Secondary | ICD-10-CM | POA: Diagnosis not present

## 2017-01-24 DIAGNOSIS — Z23 Encounter for immunization: Secondary | ICD-10-CM

## 2017-01-24 DIAGNOSIS — Z125 Encounter for screening for malignant neoplasm of prostate: Secondary | ICD-10-CM | POA: Diagnosis not present

## 2017-01-24 DIAGNOSIS — E785 Hyperlipidemia, unspecified: Secondary | ICD-10-CM | POA: Diagnosis not present

## 2017-01-24 DIAGNOSIS — I2510 Atherosclerotic heart disease of native coronary artery without angina pectoris: Secondary | ICD-10-CM

## 2017-01-24 DIAGNOSIS — I10 Essential (primary) hypertension: Secondary | ICD-10-CM

## 2017-01-24 NOTE — Progress Notes (Signed)
Subjective:   Jonathan Sharp is a 66 y.o. male who presents for Medicare Annual/Subsequent preventive examination.  Review of Systems:  N/A  Cardiac Risk Factors include: male gender;advanced age (>82men, >57 women);dyslipidemia;hypertension;obesity (BMI >30kg/m2)     Objective:    Vitals: BP (!) 142/88 (BP Location: Left Arm)   Pulse 60   Temp 97.9 F (36.6 C) (Oral)   Ht 6\' 1"  (1.854 m)   Wt 232 lb (105.2 kg)   BMI 30.61 kg/m   Body mass index is 30.61 kg/m.  Tobacco Social History   Tobacco Use  Smoking Status Never Smoker  Smokeless Tobacco Never Used     Counseling given: Not Answered   Past Medical History:  Diagnosis Date  . Coronary artery disease    a. s/p NSTEMI with DES to LAD 06/2008.  Marland Kitchen HTN (hypertension)   . Hyperlipidemia   . Kidney stones   . LV dysfunction    a. EF 40% at time of cath 06/2008, improved to normal on f/u echo.   Past Surgical History:  Procedure Laterality Date  . CAROTID STENT  2010  . CATARACT EXTRACTION, BILATERAL    . EYE SURGERY    . INTRAOCULAR LENS INSERTION Right 10/22/2016   [facility]   Family History  Problem Relation Age of Onset  . Heart attack Father 73  . Diabetes Brother        over weight   Social History   Substance and Sexual Activity  Sexual Activity Not on file    Outpatient Encounter Medications as of 01/24/2017  Medication Sig  . aspirin 81 MG tablet Take 1 tablet (81 mg total) by mouth daily.  . carvedilol (COREG) 6.25 MG tablet TAKE 1 TABLET BY MOUTH TWICE A DAY WITH A MEAL  . famotidine (PEPCID) 20 MG tablet Take 1 tablet (20 mg total) by mouth 2 (two) times daily.  . hydroxychloroquine (PLAQUENIL) 200 MG tablet Take 400 mg by mouth daily.   Marland Kitchen losartan (COZAAR) 50 MG tablet Take 1 tablet (50 mg total) by mouth daily.  . nitroGLYCERIN (NITROSTAT) 0.4 MG SL tablet TAKE 1 TABLET UNDER THE TONGUE EVERY 5 MINUTES FOR CHEST PAIN,* MAY REPEAT UP TO 3 TIMES  . simvastatin (ZOCOR) 20 MG tablet TAKE 1 TABLET  BY MOUTH DAILY.  Marland Kitchen FLUZONE HIGH-DOSE 0.5 ML injection TO BE ADMINISTERED BY PHARMACIST FOR IMMUNIZATION   No facility-administered encounter medications on file as of 01/24/2017.     Activities of Daily Living In your present state of health, do you have any difficulty performing the following activities: 01/24/2017 02/01/2016  Hearing? Y N  Comment wears bilateral hearing aids bilateral hearing aids  Vision? N Y  Comment - vision is getting worse  Difficulty concentrating or making decisions? N N  Walking or climbing stairs? N N  Dressing or bathing? N N  Doing errands, shopping? N N  Preparing Food and eating ? N N  Using the Toilet? N N  In the past six months, have you accidently leaked urine? N N  Do you have problems with loss of bowel control? N N  Managing your Medications? N N  Managing your Finances? N N  Housekeeping or managing your Housekeeping? N N  Some recent data might be hidden    Patient Care Team: Birdie Sons, MD as PCP - General (Family Medicine) Burnell Blanks, MD as Consulting Physician (Cardiology) Emmaline Kluver., MD as Consulting Physician (Rheumatology)   Assessment:  Exercise Activities and Dietary recommendations Current Exercise Habits: Home exercise routine(stays active in furniture shop and 2 acre garden), Type of exercise: Other - see comments(running), Time (Minutes): 45, Frequency (Times/Week): 1, Weekly Exercise (Minutes/Week): 45, Intensity: Mild, Exercise limited by: None identified  Goals    . Weight (lb) < 215 lb (97.5 kg)     Recommend to continue exercising and increase to 3 days a week for at least 30 minutes.      Fall Risk Fall Risk  01/24/2017 02/01/2016  Falls in the past year? No No   Depression Screen PHQ 2/9 Scores 01/24/2017 02/01/2016  PHQ - 2 Score 0 0    Cognitive Function: Pt declined screening today.     6CIT Screen 02/01/2016  What Year? 0 points  What month? 0 points  What time?  0 points  Count back from 20 0 points  Months in reverse 0 points  Repeat phrase 0 points  Total Score 0    Immunization History  Administered Date(s) Administered  . Influenza-Unspecified 12/20/2015  . Pneumococcal Conjugate-13 02/01/2016  . Tdap 07/18/2007   Screening Tests Health Maintenance  Topic Date Due  . Hepatitis C Screening  13-Nov-1950  . PNA vac Low Risk Adult (2 of 2 - PPSV23) 01/31/2017  . TETANUS/TDAP  07/17/2017  . COLONOSCOPY  07/18/2023  . INFLUENZA VACCINE  Completed      Plan:  I have personally reviewed and addressed the Medicare Annual Wellness questionnaire and have noted the following in the patient's chart:  A. Medical and social history B. Use of alcohol, tobacco or illicit drugs  C. Current medications and supplements D. Functional ability and status E.  Nutritional status F.  Physical activity G. Advance directives H. List of other physicians I.  Hospitalizations, surgeries, and ER visits in previous 12 months J.  Rouse such as hearing and vision if needed, cognitive and depression L. Referrals and appointments - none  In addition, I have reviewed and discussed with patient certain preventive protocols, quality metrics, and best practice recommendations. A written personalized care plan for preventive services as well as general preventive health recommendations were provided to patient.  See attached scanned questionnaire for additional information.   Signed,  Fabio Neighbors, LPN Nurse Health Advisor   MD Recommendations: None. Ordered the Hepatitis C screening test today.

## 2017-01-24 NOTE — Patient Instructions (Addendum)
Jonathan Sharp , Thank you for taking time to come for your Medicare Wellness Visit. I appreciate your ongoing commitment to your health goals. Please review the following plan we discussed and let me know if I can assist you in the future.   Screening recommendations/referrals: Colonoscopy: Up to date Recommended yearly ophthalmology/optometry visit for glaucoma screening and checkup Recommended yearly dental visit for hygiene and checkup  Vaccinations: Influenza vaccine: completed Pneumococcal vaccine: completed series Tdap vaccine: up to date Shingles vaccine: completed per pt  Advanced directives: Please bring a copy of your POA (Power of Attorney) and/or Living Will to your next appointment.   Conditions/risks identified: Obesity- recommend to continue exercising and increase to 3 days a week for at least 30 minutes.  Next appointment: 10:00 AM today  Preventive Care 65 Years and Older, Male Preventive care refers to lifestyle choices and visits with your health care provider that can promote health and wellness. What does preventive care include?  A yearly physical exam. This is also called an annual well check.  Dental exams once or twice a year.  Routine eye exams. Ask your health care provider how often you should have your eyes checked.  Personal lifestyle choices, including:  Daily care of your teeth and gums.  Regular physical activity.  Eating a healthy diet.  Avoiding tobacco and drug use.  Limiting alcohol use.  Practicing safe sex.  Taking low doses of aspirin every day.  Taking vitamin and mineral supplements as recommended by your health care provider. What happens during an annual well check? The services and screenings done by your health care provider during your annual well check will depend on your age, overall health, lifestyle risk factors, and family history of disease. Counseling  Your health care provider may ask you questions about  your:  Alcohol use.  Tobacco use.  Drug use.  Emotional well-being.  Home and relationship well-being.  Sexual activity.  Eating habits.  History of falls.  Memory and ability to understand (cognition).  Work and work Statistician. Screening  You may have the following tests or measurements:  Height, weight, and BMI.  Blood pressure.  Lipid and cholesterol levels. These may be checked every 5 years, or more frequently if you are over 18 years old.  Skin check.  Lung cancer screening. You may have this screening every year starting at age 28 if you have a 30-pack-year history of smoking and currently smoke or have quit within the past 15 years.  Fecal occult blood test (FOBT) of the stool. You may have this test every year starting at age 45.  Flexible sigmoidoscopy or colonoscopy. You may have a sigmoidoscopy every 5 years or a colonoscopy every 10 years starting at age 81.  Prostate cancer screening. Recommendations will vary depending on your family history and other risks.  Hepatitis C blood test.  Hepatitis B blood test.  Sexually transmitted disease (STD) testing.  Diabetes screening. This is done by checking your blood sugar (glucose) after you have not eaten for a while (fasting). You may have this done every 1-3 years.  Abdominal aortic aneurysm (AAA) screening. You may need this if you are a current or former smoker.  Osteoporosis. You may be screened starting at age 73 if you are at high risk. Talk with your health care provider about your test results, treatment options, and if necessary, the need for more tests. Vaccines  Your health care provider may recommend certain vaccines, such as:  Influenza vaccine. This  is recommended every year.  Tetanus, diphtheria, and acellular pertussis (Tdap, Td) vaccine. You may need a Td booster every 10 years.  Zoster vaccine. You may need this after age 96.  Pneumococcal 13-valent conjugate (PCV13) vaccine.  One dose is recommended after age 47.  Pneumococcal polysaccharide (PPSV23) vaccine. One dose is recommended after age 4. Talk to your health care provider about which screenings and vaccines you need and how often you need them. This information is not intended to replace advice given to you by your health care provider. Make sure you discuss any questions you have with your health care provider. Document Released: 03/25/2015 Document Revised: 11/16/2015 Document Reviewed: 12/28/2014 Elsevier Interactive Patient Education  2017 [place] Prevention in the Home Falls can cause injuries. They can happen to people of all ages. There are many things you can do to make your home safe and to help prevent falls. What can I do on the outside of my home?  Regularly fix the edges of walkways and driveways and fix any cracks.  Remove anything that might make you trip as you walk through a door, such as a raised step or threshold.  Trim any bushes or trees on the path to your home.  Use bright outdoor lighting.  Clear any walking paths of anything that might make someone trip, such as rocks or tools.  Regularly check to see if handrails are loose or broken. Make sure that both sides of any steps have handrails.  Any raised decks and porches should have guardrails on the edges.  Have any leaves, snow, or ice cleared regularly.  Use sand or salt on walking paths during winter.  Clean up any spills in your garage right away. This includes oil or grease spills. What can I do in the bathroom?  Use night lights.  Install grab bars by the toilet and in the tub and shower. Do not use towel bars as grab bars.  Use non-skid mats or decals in the tub or shower.  If you need to sit down in the shower, use a plastic, non-slip stool.  Keep the floor dry. Clean up any water that spills on the floor as soon as it happens.  Remove soap buildup in the tub or shower regularly.  Attach bath  mats securely with double-sided non-slip rug tape.  Do not have throw rugs and other things on the floor that can make you trip. What can I do in the bedroom?  Use night lights.  Make sure that you have a light by your bed that is easy to reach.  Do not use any sheets or blankets that are too big for your bed. They should not hang down onto the floor.  Have a firm chair that has side arms. You can use this for support while you get dressed.  Do not have throw rugs and other things on the floor that can make you trip. What can I do in the kitchen?  Clean up any spills right away.  Avoid walking on wet floors.  Keep items that you use a lot in easy-to-reach places.  If you need to reach something above you, use a strong step stool that has a grab bar.  Keep electrical cords out of the way.  Do not use floor polish or wax that makes floors slippery. If you must use wax, use non-skid floor wax.  Do not have throw rugs and other things on the floor that can make you  trip. What can I do with my stairs?  Do not leave any items on the stairs.  Make sure that there are handrails on both sides of the stairs and use them. Fix handrails that are broken or loose. Make sure that handrails are as long as the stairways.  Check any carpeting to make sure that it is firmly attached to the stairs. Fix any carpet that is loose or worn.  Avoid having throw rugs at the top or bottom of the stairs. If you do have throw rugs, attach them to the floor with carpet tape.  Make sure that you have a light switch at the top of the stairs and the bottom of the stairs. If you do not have them, ask someone to add them for you. What else can I do to help prevent falls?  Wear shoes that:  Do not have high heels.  Have rubber bottoms.  Are comfortable and fit you well.  Are closed at the toe. Do not wear sandals.  If you use a stepladder:  Make sure that it is fully opened. Do not climb a closed  stepladder.  Make sure that both sides of the stepladder are locked into place.  Ask someone to hold it for you, if possible.  Clearly mark and make sure that you can see:  Any grab bars or handrails.  First and last steps.  Where the edge of each step is.  Use tools that help you move around (mobility aids) if they are needed. These include:  Canes.  Walkers.  Scooters.  Crutches.  Turn on the lights when you go into a dark area. Replace any light bulbs as soon as they burn out.  Set up your furniture so you have a clear path. Avoid moving your furniture around.  If any of your floors are uneven, fix them.  If there are any pets around you, be aware of where they are.  Review your medicines with your doctor. Some medicines can make you feel dizzy. This can increase your chance of falling. Ask your doctor what other things that you can do to help prevent falls. This information is not intended to replace advice given to you by your health care provider. Make sure you discuss any questions you have with your health care provider. Document Released: 12/23/2008 Document Revised: 08/04/2015 Document Reviewed: 04/02/2014 Elsevier Interactive Patient Education  2017 [employer].

## 2017-01-24 NOTE — Progress Notes (Signed)
Patient: Jonathan Sharp, Male    DOB: 1951/02/11, 66 y.o.   MRN: [id number] Visit Date: 01/24/2017  Today's Provider: Lelon Huh, MD   Chief Complaint  Patient presents with  . Annual Exam  . Hypertension   Subjective:   Patient had AWV with [facility] this morning. No new complaints today. .   Annual physical exam Jonathan Sharp is a 66 y.o. male who presents today for health maintenance and complete physical. He feels well. He reports exercising some. He reports he is sleeping well.  ------------------------------------------------------------   Hypertension, follow-up:  BP Readings from Last 3 Encounters:  01/24/17 (!) 142/88  12/13/16 124/80  10/15/16 120/80    He was last seen for hypertension 3 months ago.  BP at that visit was 120/80. Management since that visit includes; no changes.He reports good compliance with treatment. He is not having side effects. none He is exercising. He is not adherent to low salt diet.   Outside blood pressures are 122/78. He is experiencing none.  Patient denies none.   Cardiovascular risk factors include advanced age (older than 87 for men, 20 for women).  Use of agents associated with hypertension: none.   ------------------------------------------------------------  Atherosclerosis of native coronary artery of native heart without angina pectoris From 10/15/2016- Doing well, no chest pain heart flutters or dyspnea. Had recent follow up Dr. Angelena Form with good report.    Review of Systems  Constitutional: Negative for chills, diaphoresis and fever.  HENT: Negative for congestion, ear discharge, ear pain, hearing loss, nosebleeds, sore throat and tinnitus.   Eyes: Negative for photophobia, pain, discharge and redness.  Respiratory: Negative for cough, shortness of breath, wheezing and stridor.   Cardiovascular: Negative for chest pain, palpitations and leg swelling.  Gastrointestinal: Negative for abdominal pain, blood in stool,  constipation, diarrhea, nausea and vomiting.  Endocrine: Negative for polydipsia.  Genitourinary: Negative for dysuria, flank pain, frequency, hematuria and urgency.  Musculoskeletal: Negative for back pain, myalgias and neck pain.  Skin: Negative for rash.  Allergic/Immunologic: Negative for environmental allergies.  Neurological: Negative for dizziness, tremors, seizures, weakness and headaches.  Hematological: Does not bruise/bleed easily.  Psychiatric/Behavioral: Negative for hallucinations and suicidal ideas. The patient is not nervous/anxious.     Social History      He  reports that  has never smoked. he has never used smokeless tobacco. He reports that he drinks about 1.2 oz of alcohol per week. He reports that he does not use drugs.       Social History   Socioeconomic History  . Marital status: Married    Spouse name: Not on file  . Number of children: 2  . Years of education: Not on file  . Highest education level: Not on file  Social Needs  . Financial resource strain: Not on file  . Food insecurity - worry: Not on file  . Food insecurity - inability: Not on file  . Transportation needs - medical: Not on file  . Transportation needs - non-medical: Not on file  Occupational History  . Occupation: MAINTANCE SERVICE  Tobacco Use  . Smoking status: Never Smoker  . Smokeless tobacco: Never Used  Substance and Sexual Activity  . Alcohol use: Yes    Alcohol/week: 1.2 oz    Types: 2 Shots of liquor per week  . Drug use: No  . Sexual activity: Not on file  Other Topics Concern  . 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 Coronary artery disease    a. s/p NSTEMI with DES to LAD 06/2008.  Jonathan Sharp HTN (hypertension)   . Hyperlipidemia   . Kidney stones   . LV dysfunction    a. EF 40% at  time of cath 06/2008, improved to normal on f/u echo.     Patient Active Problem List   Diagnosis Date Noted  . HTN (hypertension) 09/01/2012  . HYPERLIPIDEMIA-MIXED 06/28/2008  . CAD, NATIVE VESSEL 06/28/2008  . NEPHROLITHIASIS, HX OF 06/28/2008    Past Surgical History:  Procedure Laterality Date  . CAROTID STENT  2010  . CATARACT EXTRACTION, BILATERAL    . EYE SURGERY    . INTRAOCULAR LENS INSERTION Right 10/22/2016   [facility]    Family History        Family Status  Relation Name Status  . Mother  Alive  . Father  Deceased  . Brother  Alive  . Brother  Alive  . MGM  Deceased  . MGF  Deceased  . PGM  Deceased  . PGF  Deceased        His family history includes Diabetes in his brother; Heart attack (age of onset: 61) in his father.     No Known Allergies   Current Outpatient Medications:  .  aspirin 81 MG tablet, Take 1 tablet (81 mg total) by mouth daily., Disp: 30 tablet, Rfl: 6 .  carvedilol (COREG) 6.25 MG tablet, TAKE 1 TABLET BY MOUTH TWICE A DAY WITH A MEAL, Disp: 180 tablet, Rfl: 3 .  famotidine (PEPCID) 20 MG tablet, Take 1 tablet (20 mg total) by mouth 2 (two) times daily., Disp: 180 tablet, Rfl: 3 .  FLUZONE HIGH-DOSE 0.5 ML injection, TO BE ADMINISTERED BY PHARMACIST FOR IMMUNIZATION, Disp: , Rfl: 0 .  hydroxychloroquine (PLAQUENIL) 200 MG tablet, Take 400 mg by mouth daily. , Disp: , Rfl: 6 .  losartan (COZAAR) 50 MG tablet, Take 1 tablet (50 mg total) by mouth daily., Disp: 90 tablet, Rfl: 3 .  nitroGLYCERIN (NITROSTAT) 0.4 MG SL tablet, TAKE 1 TABLET UNDER THE TONGUE EVERY 5 MINUTES FOR CHEST PAIN,* MAY REPEAT UP TO 3 TIMES, Disp: 25 tablet, Rfl: 6 .  simvastatin (ZOCOR) 20 MG tablet, TAKE 1 TABLET BY MOUTH DAILY., Disp: 90 tablet, Rfl: 0   Patient Care Team: Birdie Sons, MD as PCP - General (Family Medicine) Burnell Blanks, MD as Consulting Physician (Cardiology) Emmaline Kluver., MD as Consulting Physician (Rheumatology)       Objective:    Vitals:  BP 142/88 Abnormal  (BP Location: Left Arm)   Pulse 60   Temp 97.9 F (36.6 C) (Oral)   Ht 6\' 1"  (1.854 m)   Wt 232 lb (105.2 kg)   BMI 30.61 kg/m       BSA 2.33 m   Physical Exam   General Appearance:    Alert, cooperative, no distress, appears stated age, mildly overweight  Head:    Normocephalic, without obvious abnormality, atraumatic  Eyes:    PERRL, conjunctiva/corneas clear, EOM's intact, fundi    benign, both eyes       Ears:    Normal TM's and external ear canals, both ears  Nose:   Nares normal, septum midline, mucosa normal, no drainage  or sinus tenderness  Throat:   Lips, mucosa, and tongue normal; teeth and gums normal  Neck:   Supple, symmetrical, trachea midline, no adenopathy;       thyroid:  No enlargement/tenderness/nodules; no carotid   bruit or JVD  Back:     Symmetric, no curvature, ROM normal, no CVA tenderness  Lungs:     Clear to auscultation bilaterally, respirations unlabored  Chest wall:    No tenderness or deformity  Heart:    Regular rate and rhythm, S1 and S2 normal, no murmur, rub   or gallop  Abdomen:     Soft, non-tender, bowel sounds active all four quadrants,    no masses, no organomegaly  Genitalia:    deferred  Rectal:    deferred  Extremities:   Extremities normal, atraumatic, no cyanosis or edema  Pulses:   2+ and symmetric all extremities  Skin:   Skin color, texture, turgor normal, no rashes or lesions  Lymph nodes:   Cervical, supraclavicular, and axillary nodes normal  Neurologic:   CNII-XII intact. Normal strength, sensation and reflexes      throughout    Depression Screen PHQ 2/9 Scores 01/24/2017 02/01/2016  PHQ - 2 Score 0 0      Assessment & Plan:     Routine Health Maintenance and Physical Exam  Exercise Activities and Dietary recommendations Goals    . Weight (lb) < 215 lb (97.5 kg)     Recommend to continue exercising and increase to 3 days a week for at least 30 minutes.        Immunization History  Administered Date(s) Administered  . Influenza-Unspecified 12/20/2015, 12/21/2016  . Pneumococcal Conjugate-13 02/01/2016  . Tdap 07/18/2007    Health Maintenance  Topic Date Due  . Hepatitis C Screening  [birth year]  . PNA vac Low Risk Adult (2 of 2 - PPSV23) 01/31/2017  . TETANUS/TDAP  07/17/2017  . COLONOSCOPY  07/18/2023  . INFLUENZA VACCINE  Completed     Discussed health benefits of physical activity, and encouraged him to engage in regular exercise appropriate for his age and condition.    --------------------------------------------------------------------  1. Annual physical exam   2. Essential hypertension Well controlled.  Continue current medications.    3. Atherosclerosis of native coronary artery of native heart without angina pectoris Asymptomatic. Compliant with medication.  Continue aggressive risk factor modification.  Continue regular follow up cardiology.  - COMPLETE METABOLIC PANEL WITH GFR - Lipid panel  4. Hyperlipidemia, unspecified hyperlipidemia type He is tolerating simvastatin well with no adverse effects.   - COMPLETE METABOLIC PANEL WITH GFR - Lipid panel - TSH  5. Need for hepatitis C screening test  - Hepatitis C antibody  6. BMI 30.0-30.9,adult   7. Prostate cancer screening  - PSA  8. Need for pneumococcal vaccination Pneumovax-23 givent today .    Lelon Huh, MD  [facility]

## 2017-01-24 NOTE — Patient Instructions (Signed)
   The CDC recommends two doses of Shingrix (the shingles vaccine) separated by 2 to 6 months for adults age 68 years and older. I recommend checking with your insurance plan regarding coverage for this vaccine.   

## 2017-01-25 DIAGNOSIS — I2510 Atherosclerotic heart disease of native coronary artery without angina pectoris: Secondary | ICD-10-CM | POA: Diagnosis not present

## 2017-01-25 DIAGNOSIS — E785 Hyperlipidemia, unspecified: Secondary | ICD-10-CM | POA: Diagnosis not present

## 2017-01-25 DIAGNOSIS — Z125 Encounter for screening for malignant neoplasm of prostate: Secondary | ICD-10-CM | POA: Diagnosis not present

## 2017-01-25 DIAGNOSIS — Z1159 Encounter for screening for other viral diseases: Secondary | ICD-10-CM | POA: Diagnosis not present

## 2017-01-26 LAB — COMPLETE METABOLIC PANEL WITH GFR
AG RATIO: 1.8 (calc) (ref 1.0–2.5)
ALBUMIN MSPROF: 4.2 g/dL (ref 3.6–5.1)
ALKALINE PHOSPHATASE (APISO): 62 U/L (ref 40–115)
ALT: 26 U/L (ref 9–46)
AST: 20 U/L (ref 10–35)
BILIRUBIN TOTAL: 0.6 mg/dL (ref 0.2–1.2)
BUN / CREAT RATIO: 15 (calc) (ref 6–22)
BUN: 22 mg/dL (ref 7–25)
CHLORIDE: 110 mmol/L (ref 98–110)
CO2: 27 mmol/L (ref 20–32)
Calcium: 9 mg/dL (ref 8.6–10.3)
Creat: 1.43 mg/dL — ABNORMAL HIGH (ref 0.70–1.25)
GFR, Est African American: 58 mL/min/{1.73_m2} — ABNORMAL LOW (ref >=60)
GFR, Est Non African American: 50 mL/min/{1.73_m2} — ABNORMAL LOW (ref >=60)
GLUCOSE: 101 mg/dL — AB (ref 65–99)
Globulin: 2.3 g/dL (calc) (ref 1.9–3.7)
POTASSIUM: 4.3 mmol/L (ref 3.5–5.3)
Sodium: 143 mmol/L (ref 135–146)
Total Protein: 6.5 g/dL (ref 6.1–8.1)

## 2017-01-26 LAB — LIPID PANEL
CHOLESTEROL: 122 mg/dL (ref <200)
HDL: 52 mg/dL (ref >40)
LDL Cholesterol (Calc): 50 mg/dL (calc)
Non-HDL Cholesterol (Calc): 70 mg/dL (calc) (ref <130)
Total CHOL/HDL Ratio: 2.3 (calc) (ref <5.0)
Triglycerides: 123 mg/dL (ref <150)

## 2017-01-26 LAB — TSH: TSH: 3.52 mIU/L (ref 0.40–4.50)

## 2017-01-26 LAB — HEPATITIS C ANTIBODY
Hepatitis C Ab: NONREACTIVE
SIGNAL TO CUT-OFF: 0.01 (ref <1.00)

## 2017-01-26 LAB — PSA: PSA: 0.3 ng/mL (ref <=4.0)

## 2017-02-18 ENCOUNTER — Other Ambulatory Visit: Payer: Self-pay | Admitting: Cardiovascular Disease

## 2017-02-18 DIAGNOSIS — I2510 Atherosclerotic heart disease of native coronary artery without angina pectoris: Secondary | ICD-10-CM

## 2017-02-18 DIAGNOSIS — I10 Essential (primary) hypertension: Secondary | ICD-10-CM

## 2017-02-22 ENCOUNTER — Other Ambulatory Visit: Payer: Self-pay | Admitting: Cardiovascular Disease

## 2017-02-22 DIAGNOSIS — I2510 Atherosclerotic heart disease of native coronary artery without angina pectoris: Secondary | ICD-10-CM

## 2017-02-22 DIAGNOSIS — I10 Essential (primary) hypertension: Secondary | ICD-10-CM

## 2017-02-23 ENCOUNTER — Other Ambulatory Visit: Payer: Self-pay | Admitting: Cardiovascular Disease

## 2017-02-23 DIAGNOSIS — E7849 Other hyperlipidemia: Secondary | ICD-10-CM

## 2017-02-23 DIAGNOSIS — I10 Essential (primary) hypertension: Secondary | ICD-10-CM

## 2017-02-23 DIAGNOSIS — I2510 Atherosclerotic heart disease of native coronary artery without angina pectoris: Secondary | ICD-10-CM

## 2017-05-05 ENCOUNTER — Other Ambulatory Visit: Payer: Self-pay | Admitting: Cardiovascular Disease

## 2017-05-07 MED ORDER — NITROGLYCERIN 0.4 MG SL SUBL: SUBLINGUAL_TABLET | SUBLINGUAL | 1 refills | Status: DC

## 2017-05-07 NOTE — Addendum Note (Signed)
Addended by: Derl Barrow on: 05/07/2017 12:54 PM   Modules accepted: Orders

## 2017-05-20 ENCOUNTER — Other Ambulatory Visit: Payer: Self-pay | Admitting: Cardiovascular Disease

## 2017-05-20 DIAGNOSIS — I10 Essential (primary) hypertension: Secondary | ICD-10-CM

## 2017-05-20 DIAGNOSIS — I2510 Atherosclerotic heart disease of native coronary artery without angina pectoris: Secondary | ICD-10-CM

## 2017-05-20 MED ORDER — LOSARTAN POTASSIUM 50 MG PO TABS: 50.0000 mg | ORAL_TABLET | Freq: Every day | ORAL | 1 refills | Status: DC

## 2017-06-10 DIAGNOSIS — H401231 Low-tension glaucoma, bilateral, mild stage: Secondary | ICD-10-CM | POA: Diagnosis not present

## 2017-06-10 DIAGNOSIS — H401294 Low-tension glaucoma, unspecified eye, indeterminate stage: Secondary | ICD-10-CM | POA: Diagnosis not present

## 2017-12-15 NOTE — Progress Notes (Signed)
Chief Complaint  Patient presents with  . Follow-up    CAD    History of Present Illness: 67 yo male with h/o CAD, hyperlipidemia here today for follow up. In April 2010 he had a non-ST elevation MI treated with drug eluting stent placement in the LAD. His ejection fraction was initially 40% but this improved to 60%. Stress myoview 09/01/12 in setting of admission for chest pain with LVEF=55%, no ischemia. He had LE edema with Norvasc. He did not tolerate high dose statin therapy. Zocor lowered to 20 mg per day due to muscle aches. He has tolerated the 20 mg daily dose.   He is here today for follow up. The patient denies any chest pain, dyspnea, palpitations, lower extremity edema, orthopnea, PND, dizziness, near syncope or syncope. His BP has been elevated at home.   Primary Care Physician: Birdie Sons, MD  Past Medical History:  Diagnosis Date  . Coronary artery disease    a. s/p NSTEMI with DES to LAD 06/2008.  Marland Kitchen HTN (hypertension)   . Hyperlipidemia   . Kidney stones   . LV dysfunction    a. EF 40% at time of cath 06/2008, improved to normal on f/u echo.    Past Surgical History:  Procedure Laterality Date  . CAROTID STENT  2010  . CATARACT EXTRACTION, BILATERAL    . EYE SURGERY    . INTRAOCULAR LENS INSERTION Right 10/22/2016   [facility]    Current Outpatient Medications  Medication Sig Dispense Refill  . aspirin 81 MG tablet Take 1 tablet (81 mg total) by mouth daily. 30 tablet 6  . carvedilol (COREG) 6.25 MG tablet TAKE 1 TABLET BY MOUTH TWICE A DAY WITH A MEAL 180 tablet 3  . famotidine (PEPCID) 20 MG tablet TAKE 1 TABLET BY MOUTH 2 (TWO) TIMES DAILY. 180 tablet 3  . FLUZONE HIGH-DOSE 0.5 ML injection TO BE ADMINISTERED BY PHARMACIST FOR IMMUNIZATION  0  . hydroxychloroquine (PLAQUENIL) 200 MG tablet Take 400 mg by mouth daily.   6  . nitroGLYCERIN (NITROSTAT) 0.4 MG SL tablet PLACE 1 TABLET UNDER THE TONGUE EVERY 5 MINUTES AS NEEDED FOR CHEST PAIN,* MAY REPEAT UP TO 3  TIMES 75 tablet 1  . simvastatin (ZOCOR) 20 MG tablet TAKE 1 TABLET BY MOUTH DAILY. 90 tablet 2  . losartan (COZAAR) 50 MG tablet Take 1 tablet (50 mg total) by mouth 2 (two) times daily. 180 tablet 3   No current facility-administered medications for this visit.     No Known Allergies  Social History   Socioeconomic History  . Marital status: Married    Spouse name: Not on file  . Number of children: 2  . Years of education: Not on file  . Highest education level: Not on file  Occupational History  . Occupation: [employer]ICE  Social Needs  . Financial resource strain: Not on file  . Food insecurity:    Worry: Not on file    Inability: Not on file  . Transportation needs:    Medical: Not on file    Non-medical: Not on file  Tobacco Use  . Smoking status: Never Smoker  . Smokeless tobacco: Never Used  Substance and Sexual Activity  . Alcohol use: Yes    Alcohol/week: 2.0 standard drinks    Types: 2 Shots of liquor per week  . Drug use: No  . Sexual activity: Not on file  Lifestyle  . Physical activity:    Days per week: Not  on file    Minutes per session: Not on file  . Stress: Not on file  Relationships  . Social connections:    Talks on phone: Not on file    Gets together: Not on file    Attends religious service: Not on file    Active member of club or organization: Not on file    Attends meetings of clubs or organizations: Not on file    Relationship status: Not on file  . Intimate partner violence:    Fear of current or ex partner: Not on file    Emotionally abused: Not on file    Physically abused: Not on file    Forced sexual activity: Not on file  Other Topics Concern  . 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 Heart attack Father 31  . Diabetes  Brother        over weight    Review of Systems:  As stated in the HPI and otherwise negative.   BP (!) 174/100   Pulse (!) 55   Ht 6\' 1"  (1.854 m)   Wt 234 lb (106.1 kg)   BMI 30.87 kg/m   Physical Examination:  General: Well developed, well nourished, NAD  HEENT: OP clear, mucus membranes moist  SKIN: warm, dry. No rashes. Neuro: No focal deficits  Musculoskeletal: Muscle strength 5/5 all ext  Psychiatric: Mood and affect normal  Neck: No JVD, no carotid bruits, no thyromegaly, no lymphadenopathy.  Lungs:Clear bilaterally, no wheezes, rhonci, crackles Cardiovascular: Regular rate and rhythm. No murmurs, gallops or rubs. Abdomen:Soft. Bowel sounds present. Non-tender.  Extremities: No lower extremity edema. Pulses are 2 + in the bilateral DP/PT.  EKG:  EKG is ordered today. The ekg ordered today demonstrates  Sinus brady, rate 55 bpm. LVH. Poor R wave progression.   Recent Labs: 01/25/2017: ALT 26; BUN 22; Creat 1.43; Potassium 4.3; Sodium 143; TSH 3.52   Lipid Panel    Component Value Date/Time   CHOL 122 01/25/2017 0801   TRIG 123 01/25/2017 0801   HDL 52 01/25/2017 0801   CHOLHDL 2.3 01/25/2017 0801   VLDL 29 12/14/2015 0827   LDLCALC 50 01/25/2017 0801     Wt Readings from Last 3 Encounters:  12/16/17 234 lb (106.1 kg)  01/24/17 232 lb (105.2 kg)  12/13/16 226 lb (102.5 kg)     Other studies Reviewed: Additional studies/ records that were reviewed today include: . Review of the above records demonstrates:    Assessment and Plan:   1. CAD without angina: No chest pain. Will continue ASA, statin and beta blocker. Will arrange echo to assess LV systolic function. He has had no assessment of his LVEF over the past five years.     2. HYPERLIPIDEMIA: Lipids now followed in primary care. Continue statin.   3. HTN: BP is elevated. Will increase Cozaar to 50 mg po BID.    Current medicines are reviewed at length with the patient today.  The patient does not  have concerns regarding medicines.  The following changes have been made:  no change  Labs/ tests ordered today include:   Orders Placed This Encounter  Procedures  . EKG 12-Lead  . ECHOCARDIOGRAM COMPLETE     Disposition:  FU with me in 12  months   Signed, Lauree Chandler, MD 12/16/2017 10:31 AM    [facility] [place], [place], [place]  [address] Phone: [phone]; Fax: [phone]

## 2017-12-16 ENCOUNTER — Encounter: Payer: Self-pay | Admitting: Cardiovascular Disease

## 2017-12-16 ENCOUNTER — Ambulatory Visit: Payer: Medicare Other | Admitting: Cardiovascular Disease

## 2017-12-16 VITALS — BP 174/100 | HR 55 | Ht 73.0 in | Wt 234.0 lb

## 2017-12-16 DIAGNOSIS — I2510 Atherosclerotic heart disease of native coronary artery without angina pectoris: Secondary | ICD-10-CM

## 2017-12-16 DIAGNOSIS — I10 Essential (primary) hypertension: Secondary | ICD-10-CM | POA: Diagnosis not present

## 2017-12-16 DIAGNOSIS — E7800 Pure hypercholesterolemia, unspecified: Secondary | ICD-10-CM | POA: Diagnosis not present

## 2017-12-16 MED ORDER — LOSARTAN POTASSIUM 50 MG PO TABS: 50.0000 mg | ORAL_TABLET | Freq: Two times a day (BID) | ORAL | 3 refills | Status: DC

## 2017-12-16 NOTE — Patient Instructions (Signed)
Medication Instructions:  Your physician has recommended you make the following change in your medication: Increase Cozaar to 50 mg by mouth twice daily.    Labwork: none  Testing/Procedures: Your physician has requested that you have an echocardiogram. Echocardiography is a painless test that uses sound waves to create images of your heart. It provides your doctor with information about the size and shape of your heart and how well your heart's chambers and valves are working. This procedure takes approximately one hour. There are no restrictions for this procedure.    Follow-Up:  Your physician recommends that you schedule a follow-up appointment in: 12 months. Please call our office in about 8 months to schedule this appointment   Any Other Special Instructions Will Be Listed Below (If Applicable).     If you need a refill on your cardiac medications before your next appointment, please call your pharmacy.

## 2017-12-17 DIAGNOSIS — H40023 Open angle with borderline findings, high risk, bilateral: Secondary | ICD-10-CM | POA: Diagnosis not present

## 2017-12-25 ENCOUNTER — Ambulatory Visit (HOSPITAL_COMMUNITY): Payer: Medicare Other | Attending: Cardiology

## 2017-12-25 ENCOUNTER — Other Ambulatory Visit: Payer: Self-pay

## 2017-12-25 DIAGNOSIS — I2510 Atherosclerotic heart disease of native coronary artery without angina pectoris: Secondary | ICD-10-CM | POA: Diagnosis not present

## 2018-01-13 NOTE — Progress Notes (Signed)
Error

## 2018-01-27 ENCOUNTER — Ambulatory Visit (INDEPENDENT_AMBULATORY_CARE_PROVIDER_SITE_OTHER): Payer: Medicare Other

## 2018-01-27 VITALS — BP 122/72 | HR 66 | Temp 97.6°F | Ht 73.0 in | Wt 239.4 lb

## 2018-01-27 DIAGNOSIS — Z0000 Encounter for general adult medical examination without abnormal findings: Secondary | ICD-10-CM | POA: Diagnosis not present

## 2018-01-27 NOTE — Patient Instructions (Addendum)
[name] , Thank you for taking time to come for your Medicare Wellness Visit. I appreciate your ongoing commitment to your health goals. Please review the following plan we discussed and let me know if I can assist you in the future.   Screening recommendations/referrals: Colonoscopy: Up to date, due 07/2018 Recommended yearly ophthalmology/optometry visit for glaucoma screening and checkup Recommended yearly dental visit for hygiene and checkup  Vaccinations: Influenza vaccine: Up to date Pneumococcal vaccine: Completed series Tdap vaccine: Pt declines today.  Shingles vaccine: Pt declines today.     Advanced directives: Please bring a copy of your POA (Power of Attorney) and/or Living Will to your next appointment.   Conditions/risks identified: Obesity- recommend to start exercising 3 days a week for at least 30 minutes a time.   Next appointment: 03/19/18 @ 9 AM with Dr Caryn Section.   Preventive Care 69 Years and Older, Male Preventive care refers to lifestyle choices and visits with your health care provider that can promote health and wellness. What does preventive care include?  A yearly physical exam. This is also called an annual well check.  Dental exams once or twice a year.  Routine eye exams. Ask your health care provider how often you should have your eyes checked.  Personal lifestyle choices, including:  Daily care of your teeth and gums.  Regular physical activity.  Eating a healthy diet.  Avoiding tobacco and drug use.  Limiting alcohol use.  Practicing safe sex.  Taking low doses of aspirin every day.  Taking vitamin and mineral supplements as recommended by your health care provider. What happens during an annual well check? The services and screenings done by your health care provider during your annual well check will depend on your age, overall health, lifestyle risk factors, and family history of disease. Counseling  Your health care provider may  ask you questions about your:  Alcohol use.  Tobacco use.  Drug use.  Emotional well-being.  Home and relationship well-being.  Sexual activity.  Eating habits.  History of falls.  Memory and ability to understand (cognition).  Work and work Statistician. Screening  You may have the following tests or measurements:  Height, weight, and BMI.  Blood pressure.  Lipid and cholesterol levels. These may be checked every 5 years, or more frequently if you are over 41 years old.  Skin check.  Lung cancer screening. You may have this screening every year starting at age 69 if you have a 30-pack-year history of smoking and currently smoke or have quit within the past 15 years.  Fecal occult blood test (FOBT) of the stool. You may have this test every year starting at age 4.  Flexible sigmoidoscopy or colonoscopy. You may have a sigmoidoscopy every 5 years or a colonoscopy every 10 years starting at age 28.  Prostate cancer screening. Recommendations will vary depending on your family history and other risks.  Hepatitis C blood test.  Hepatitis B blood test.  Sexually transmitted disease (STD) testing.  Diabetes screening. This is done by checking your blood sugar (glucose) after you have not eaten for a while (fasting). You may have this done every 1-3 years.  Abdominal aortic aneurysm (AAA) screening. You may need this if you are a current or former smoker.  Osteoporosis. You may be screened starting at age 66 if you are at high risk. Talk with your health care provider about your test results, treatment options, and if necessary, the need for more tests. Vaccines  Your  health care provider may recommend certain vaccines, such as:  Influenza vaccine. This is recommended every year.  Tetanus, diphtheria, and acellular pertussis (Tdap, Td) vaccine. You may need a Td booster every 10 years.  Zoster vaccine. You may need this after age 40.  Pneumococcal 13-valent  conjugate (PCV13) vaccine. One dose is recommended after age 41.  Pneumococcal polysaccharide (PPSV23) vaccine. One dose is recommended after age 13. Talk to your health care provider about which screenings and vaccines you need and how often you need them. This information is not intended to replace advice given to you by your health care provider. Make sure you discuss any questions you have with your health care provider. Document Released: 03/25/2015 Document Revised: 11/16/2015 Document Reviewed: 12/28/2014 Elsevier Interactive Patient Education  2017 [place] Prevention in the Home Falls can cause injuries. They can happen to people of all ages. There are many things you can do to make your home safe and to help prevent falls. What can I do on the outside of my home?  Regularly fix the edges of walkways and driveways and fix any cracks.  Remove anything that might make you trip as you walk through a door, such as a raised step or threshold.  Trim any bushes or trees on the path to your home.  Use bright outdoor lighting.  Clear any walking paths of anything that might make someone trip, such as rocks or tools.  Regularly check to see if handrails are loose or broken. Make sure that both sides of any steps have handrails.  Any raised decks and porches should have guardrails on the edges.  Have any leaves, snow, or ice cleared regularly.  Use sand or salt on walking paths during winter.  Clean up any spills in your garage right away. This includes oil or grease spills. What can I do in the bathroom?  Use night lights.  Install grab bars by the toilet and in the tub and shower. Do not use towel bars as grab bars.  Use non-skid mats or decals in the tub or shower.  If you need to sit down in the shower, use a plastic, non-slip stool.  Keep the floor dry. Clean up any water that spills on the floor as soon as it happens.  Remove soap buildup in the tub or  shower regularly.  Attach bath mats securely with double-sided non-slip rug tape.  Do not have throw rugs and other things on the floor that can make you trip. What can I do in the bedroom?  Use night lights.  Make sure that you have a light by your bed that is easy to reach.  Do not use any sheets or blankets that are too big for your bed. They should not hang down onto the floor.  Have a firm chair that has side arms. You can use this for support while you get dressed.  Do not have throw rugs and other things on the floor that can make you trip. What can I do in the kitchen?  Clean up any spills right away.  Avoid walking on wet floors.  Keep items that you use a lot in easy-to-reach places.  If you need to reach something above you, use a strong step stool that has a grab bar.  Keep electrical cords out of the way.  Do not use floor polish or wax that makes floors slippery. If you must use wax, use non-skid floor wax.  Do not  have throw rugs and other things on the floor that can make you trip. What can I do with my stairs?  Do not leave any items on the stairs.  Make sure that there are handrails on both sides of the stairs and use them. Fix handrails that are broken or loose. Make sure that handrails are as long as the stairways.  Check any carpeting to make sure that it is firmly attached to the stairs. Fix any carpet that is loose or worn.  Avoid having throw rugs at the top or bottom of the stairs. If you do have throw rugs, attach them to the floor with carpet tape.  Make sure that you have a light switch at the top of the stairs and the bottom of the stairs. If you do not have them, ask someone to add them for you. What else can I do to help prevent falls?  Wear shoes that:  Do not have high heels.  Have rubber bottoms.  Are comfortable and fit you well.  Are closed at the toe. Do not wear sandals.  If you use a stepladder:  Make sure that it is fully  opened. Do not climb a closed stepladder.  Make sure that both sides of the stepladder are locked into place.  Ask someone to hold it for you, if possible.  Clearly mark and make sure that you can see:  Any grab bars or handrails.  First and last steps.  Where the edge of each step is.  Use tools that help you move around (mobility aids) if they are needed. These include:  Canes.  Walkers.  Scooters.  Crutches.  Turn on the lights when you go into a dark area. Replace any light bulbs as soon as they burn out.  Set up your furniture so you have a clear path. Avoid moving your furniture around.  If any of your floors are uneven, fix them.  If there are any pets around you, be aware of where they are.  Review your medicines with your doctor. Some medicines can make you feel dizzy. This can increase your chance of falling. Ask your doctor what other things that you can do to help prevent falls. This information is not intended to replace advice given to you by your health care provider. Make sure you discuss any questions you have with your health care provider. Document Released: 12/23/2008 Document Revised: 08/04/2015 Document Reviewed: 04/02/2014 Elsevier Interactive Patient Education  2017 [employer].

## 2018-01-27 NOTE — Progress Notes (Signed)
Subjective:   Jonathan Sharp is a 67 y.o. male who presents for Medicare Annual/Subsequent preventive examination.  Review of Systems:  N/A Cardiac Risk Factors include: advanced age (>78men, >73 women);dyslipidemia;hypertension;male gender;obesity (BMI >30kg/m2)     Objective:    Vitals: BP 122/72 (BP Location: Right Arm)   Pulse 66   Temp 97.6 F (36.4 C) (Oral)   Ht 6\' 1"  (1.854 m)   Wt 239 lb 6.4 oz (108.6 kg)   BMI 31.59 kg/m   Body mass index is 31.59 kg/m.  Advanced Directives 01/27/2018 01/24/2017 02/01/2016 09/23/2014 08/31/2012  Does Patient Have a Medical Advance Directive? Yes Yes Yes No Patient would not like information;Patient does not have advance directive  Type of Advance Directive [place];Living will [place];Living will [place];Living will - -  Copy of [place] in Chart? No - copy requested No - copy requested No - copy requested - -  Would patient like information on creating a medical advance directive? - - - No - patient declined information -  Pre-existing out of facility DNR order (yellow form or pink MOST form) - - - - No    Tobacco Social History   Tobacco Use  Smoking Status Never Smoker  Smokeless Tobacco Never Used     Counseling given: Not Answered   Clinical Intake:  Pre-visit preparation completed: Yes  Pain : No/denies pain Pain Score: 0-No pain     Nutritional Status: BMI > 30  Obese Nutritional Risks: None Diabetes: No  How often do you need to have someone help you when you read instructions, pamphlets, or other written materials from your doctor or pharmacy?: 1 - Never  Interpreter Needed?: No  Information entered by :: [facility], LPN  Past Medical History:  Diagnosis Date  . Coronary artery disease    a. s/p NSTEMI with DES to LAD 06/2008.  Marland Kitchen HTN (hypertension)   . Hyperlipidemia   . Kidney stones   . LV dysfunction    a. EF 40% at  time of cath 06/2008, improved to normal on f/u echo.   Past Surgical History:  Procedure Laterality Date  . CAROTID STENT  2010  . CATARACT EXTRACTION, BILATERAL    . EYE SURGERY    . INTRAOCULAR LENS INSERTION Right 10/22/2016   [facility]   Family History  Problem Relation Age of Onset  . Heart attack Father 6  . Diabetes Brother        over weight   Social History   Socioeconomic History  . Marital status: Married    Spouse name: Not on file  . Number of children: 2  . Years of education: Not on file  . Highest education level: Some college, no degree  Occupational History  . Occupation: MAINTANCE SERVICE    Comment: retired    Comment: Owns store / Games developer  Social Needs  . Financial resource strain: Not hard at all  . Food insecurity:    Worry: Never true    Inability: Never true  . Transportation needs:    Medical: No    Non-medical: No  Tobacco Use  . Smoking status: Never Smoker  . Smokeless tobacco: Never Used  Substance and Sexual Activity  . Alcohol use: Yes    Alcohol/week: 1.0 - 2.0 standard drinks    Types: 1 - 2 Shots of liquor per week  . Drug use: No  . Sexual activity: Not on file  Lifestyle  .  Physical activity:    Days per week: 0 days    Minutes per session: 0 min  . Stress: Not at all  Relationships  . Social connections:    Talks on phone: Patient refused    Gets together: Patient refused    Attends religious service: Patient refused    Active member of club or organization: Patient refused    Attends meetings of clubs or organizations: Patient refused    Relationship status: Patient refused  Other Topics Concern  . 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 aspirin 81 MG tablet Take 1 tablet (81 mg total) by mouth daily.    . carvedilol (COREG) 6.25 MG tablet TAKE 1 TABLET BY MOUTH TWICE A DAY WITH A MEAL  . famotidine (PEPCID) 20 MG tablet TAKE 1 TABLET BY MOUTH 2 (TWO) TIMES DAILY.  Marland Kitchen losartan (COZAAR) 50 MG tablet Take 1 tablet (50 mg total) by mouth 2 (two) times daily.  . nitroGLYCERIN (NITROSTAT) 0.4 MG SL tablet PLACE 1 TABLET UNDER THE TONGUE EVERY 5 MINUTES AS NEEDED FOR CHEST PAIN,* MAY REPEAT UP TO 3 TIMES  . simvastatin (ZOCOR) 20 MG tablet TAKE 1 TABLET BY MOUTH DAILY.  Marland Kitchen FLUZONE HIGH-DOSE 0.5 ML injection TO BE ADMINISTERED BY PHARMACIST FOR IMMUNIZATION  . hydroxychloroquine (PLAQUENIL) 200 MG tablet Take 400 mg by mouth daily.    No facility-administered encounter medications on file as of 01/27/2018.     Activities of Daily Living In your present state of health, do you have any difficulty performing the following activities: 01/27/2018  Hearing? Y  Comment Wears hearing aids.   Vision? N  Comment Wears readers.   Difficulty concentrating or making decisions? N  Walking or climbing stairs? N  Dressing or bathing? N  Doing errands, shopping? N  Preparing Food and eating ? N  Using the Toilet? N  In the past six months, have you accidently leaked urine? N  Do you have problems with loss of bowel control? N  Managing your Medications? N  Managing your Finances? N  Housekeeping or managing your Housekeeping? N  Some recent data might be hidden    Patient Care Team: Birdie Sons, MD as PCP - General (Family Medicine) Burnell Blanks, MD as PCP - Cardiology (Cardiology) Emmaline Kluver., MD as Consulting Physician (Rheumatology)   Assessment:   This is a routine wellness examination for Hel[place].  Exercise Activities and Dietary recommendations Current Exercise Habits: The patient does not participate in regular exercise at present, Exercise limited by: orthopedic condition(s)  Goals    . Increase water intake     Starting 02/01/16, I will increase my water intake  to 4 glasses a day.    . Weight (lb) < 215 lb (97.5 kg)     Recommend to continue exercising and increase to 3 days a week for at least 30 minutes.       Fall Risk Fall Risk  01/27/2018 01/24/2017 02/01/2016  Falls in the past year? 0 No No   FALL RISK PREVENTION PERTAINING TO THE HOME:  Any stairs in or around the home WITH handrails? Yes  Home free of loose throw rugs in walkways, pet beds, electrical cords, etc? Yes  Adequate  lighting in your home to reduce risk of falls? Yes   ASSISTIVE DEVICES UTILIZED TO PREVENT FALLS:  Life alert? No  Use of a cane, walker or w/c? No  Grab bars in the bathroom? No  Shower chair or bench in shower? No  Elevated toilet seat or a handicapped toilet? Yes    TIMED UP AND GO:  Was the test performed? No .    Depression Screen PHQ 2/9 Scores 01/27/2018 01/24/2017 02/01/2016  PHQ - 2 Score 0 0 0    Cognitive Function     6CIT Screen 01/27/2018 02/01/2016  What Year? 0 points 0 points  What month? 0 points 0 points  What time? 0 points 0 points  Count back from 20 0 points 0 points  Months in reverse 0 points 0 points  Repeat phrase 0 points 0 points  Total Score 0 0    Immunization History  Administered Date(s) Administered  . Influenza-Unspecified 12/20/2015, 12/21/2016, 01/06/2018  . Pneumococcal Conjugate-13 02/01/2016  . Pneumococcal Polysaccharide-23 01/24/2017  . Tdap 07/18/2007    Qualifies for Shingles Vaccine? Yes . Due for Shingrix. Education has been provided regarding the importance of this vaccine. Pt has been advised to call insurance company to determine out of pocket expense. Advised may also receive vaccine at local pharmacy or Health Dept. Verbalized acceptance and understanding.  Tdap: Although this vaccine is not a covered service during a Wellness Exam, does the patient still wish to receive this vaccine today?  No .  Education has been provided regarding the importance of this vaccine. Advised may  receive this vaccine at local pharmacy or Health Dept. Aware to provide a copy of the vaccination record if obtained from local pharmacy or Health Dept. Verbalized acceptance and understanding.  Flu Vaccine: Up to date  Pneumococcal Vaccine: Up to date  Screening Tests Health Maintenance  Topic Date Due  . TETANUS/TDAP  07/17/2017  . COLONOSCOPY  07/18/2018  . INFLUENZA VACCINE  Completed  . Hepatitis C Screening  Completed  . PNA vac Low Risk Adult  Completed   Cancer Screenings:  Colorectal Screening: Completed 07/17/13. Repeat every 5 years.  Lung Cancer Screening: (Low Dose CT Chest recommended if Age 38-80 years, 30 pack-year currently smoking OR have quit w/in 15years.) does not qualify.    Additional Screening:  Hepatitis C Screening: Up to date  Vision Screening: Recommended annual ophthalmology exams for early detection of glaucoma and other disorders of the eye.  Dental Screening: Recommended annual dental exams for proper oral hygiene  Community Resource Referral:  CRR required this visit?  No        Plan:  I have personally reviewed and addressed the Medicare Annual Wellness questionnaire and have noted the following in the patient's chart:  A. Medical and social history B. Use of alcohol, tobacco or illicit drugs  C. Current medications and supplements D. Functional ability and status E.  Nutritional status F.  Physical activity G. Advance directives H. List of other physicians I.  Hospitalizations, surgeries, and ER visits in previous 12 months J.  [place] such as hearing and vision if needed, cognitive and depression L. Referrals and appointments - none  In addition, I have reviewed and discussed with patient certain preventive protocols, quality metrics, and best practice recommendations. A written personalized care plan for preventive services as well as general preventive health recommendations were provided to patient.  See attached  scanned questionnaire for additional information.   Signed,  Fabio Neighbors,  LPN Nurse Health Advisor   Nurse Recommendations: Pt declined the tetanus vaccine today.

## 2018-02-07 ENCOUNTER — Other Ambulatory Visit: Payer: Self-pay | Admitting: Cardiovascular Disease

## 2018-02-07 DIAGNOSIS — I2510 Atherosclerotic heart disease of native coronary artery without angina pectoris: Secondary | ICD-10-CM

## 2018-02-07 DIAGNOSIS — I10 Essential (primary) hypertension: Secondary | ICD-10-CM

## 2018-02-10 NOTE — Telephone Encounter (Signed)
Outpatient Medication Detail    Disp Refills Start End   losartan (COZAAR) 50 MG tablet 180 tablet 3 12/16/2017    Sig - Route: Take 1 tablet (50 mg total) by mouth 2 (two) times daily. - Oral   Sent to pharmacy as: losartan (COZAAR) 50 MG tablet   Notes to Pharmacy: Dose increase   E-Prescribing Status: Receipt confirmed by pharmacy (12/16/2017 10:23 AM EDT)   Pharmacy   [id number] - [place], [place] - [address] [place]

## 2018-03-16 ENCOUNTER — Other Ambulatory Visit: Payer: Self-pay | Admitting: Cardiovascular Disease

## 2018-03-16 DIAGNOSIS — I10 Essential (primary) hypertension: Secondary | ICD-10-CM

## 2018-03-16 DIAGNOSIS — I2510 Atherosclerotic heart disease of native coronary artery without angina pectoris: Secondary | ICD-10-CM

## 2018-03-16 DIAGNOSIS — E7849 Other hyperlipidemia: Secondary | ICD-10-CM

## 2018-03-19 ENCOUNTER — Other Ambulatory Visit: Payer: Self-pay

## 2018-03-19 ENCOUNTER — Encounter: Payer: Self-pay | Admitting: Family Medicine

## 2018-03-19 ENCOUNTER — Ambulatory Visit (INDEPENDENT_AMBULATORY_CARE_PROVIDER_SITE_OTHER): Payer: Medicare Other | Admitting: Family Medicine

## 2018-03-19 VITALS — BP 146/88 | HR 59 | Temp 97.6°F | Ht 73.0 in | Wt 239.4 lb

## 2018-03-19 DIAGNOSIS — I10 Essential (primary) hypertension: Secondary | ICD-10-CM | POA: Diagnosis not present

## 2018-03-19 DIAGNOSIS — Z125 Encounter for screening for malignant neoplasm of prostate: Secondary | ICD-10-CM

## 2018-03-19 DIAGNOSIS — I2510 Atherosclerotic heart disease of native coronary artery without angina pectoris: Secondary | ICD-10-CM

## 2018-03-19 DIAGNOSIS — M791 Myalgia, unspecified site: Secondary | ICD-10-CM | POA: Diagnosis not present

## 2018-03-19 DIAGNOSIS — T466X5A Adverse effect of antihyperlipidemic and antiarteriosclerotic drugs, initial encounter: Secondary | ICD-10-CM

## 2018-03-19 DIAGNOSIS — Z0000 Encounter for general adult medical examination without abnormal findings: Secondary | ICD-10-CM

## 2018-03-19 NOTE — Progress Notes (Signed)
Patient: Jonathan Sharp, Male    DOB: 1950/03/24, 68 y.o.   MRN: [id number] Visit Date: 03/19/2018  Today's Provider: Lelon Huh, MD   Chief Complaint  Patient presents with  . Annual Exam   Subjective:     Complete Physical Jonathan Sharp is a 68 y.o. male. He feels well. He reports exercising with his type of work he does and he does run some. He reports he is sleeping well.  -----------------------------------------------------------   Hypertension, follow-up:  BP Readings from Last 3 Encounters:  03/19/18 (!) 156/90  01/27/18 122/72  12/16/17 (!) 174/100     He reports good compliance with treatment. He is not having side effects.   He is adherent to low salt diet.   Outside blood pressures are running 120s/80s since increasing losartan.  Patient denies chest pain, fatigue, irregular heart beat, palpitations and syncope.       Weight trend: stable Wt Readings from Last 3 Encounters:  03/19/18 239 lb 6.4 oz (108.6 kg)  01/27/18 239 lb 6.4 oz (108.6 kg)  12/16/17 234 lb (106.1 kg)    ------------------------------------------------------------------------   Lipid/Cholesterol, Follow-up:    . Last Lipid Panel:    Component Value Date/Time   CHOL 122 01/25/2017 0801   TRIG 123 01/25/2017 0801   HDL 52 01/25/2017 0801   CHOLHDL 2.3 01/25/2017 0801   VLDL 29 12/14/2015 0827   LDLCALC 50 01/25/2017 0801   Previously on 40mg  simvastatin or 20mg  atorvastatin  but did not tolerate due to muscle aches  He reports excellent compliance with treatment. He is not having side effects.    Wt Readings from Last 3 Encounters:  03/19/18 239 lb 6.4 oz (108.6 kg)  01/27/18 239 lb 6.4 oz (108.6 kg)  12/16/17 234 lb (106.1 kg)    ------------------------------------------------------------------- He continue to follow up periodically with Dr. Angelena Form who he last saw in October and had losartan increase to 50mg  BID.   Review of Systems  Constitutional:  Negative for chills, diaphoresis and fever.  HENT: Positive for hearing loss and tinnitus. Negative for congestion, ear discharge, ear pain, nosebleeds and sore throat.   Eyes: Negative for photophobia, pain, discharge and redness.  Respiratory: Positive for wheezing (when he lays down per wife). Negative for cough, shortness of breath and stridor.   Cardiovascular: Negative for chest pain, palpitations and leg swelling.  Gastrointestinal: Negative for abdominal pain, blood in stool, constipation, diarrhea, nausea and vomiting.  Endocrine: Negative for polydipsia.  Genitourinary: Negative for dysuria, flank pain, frequency, hematuria and urgency.  Musculoskeletal: Positive for arthralgias. Negative for back pain, myalgias and neck pain.  Skin: Negative for rash.  Allergic/Immunologic: Negative for environmental allergies.  Neurological: Negative for dizziness, tremors, seizures, weakness and headaches.  Hematological: Does not bruise/bleed easily.  Psychiatric/Behavioral: Negative for hallucinations and suicidal ideas. The patient is not nervous/anxious.     Social History   Socioeconomic History  . Marital status: Married    Spouse name: Not on file  . Number of children: 2  . Years of education: Not on file  . Highest education level: Some college, no degree  Occupational History  . Occupation: MAINTANCE SERVICE    Comment: retired    Comment: Owns store / Games developer  Social Needs  . Financial resource strain: Not hard at all  . Food insecurity:    Worry: Never true    Inability: Never true  . Transportation needs:    Medical: No  Non-medical: No  Tobacco Use  . Smoking status: Never Smoker  . Smokeless tobacco: Never Used  Substance and Sexual Activity  . Alcohol use: Yes    Alcohol/week: 1.0 - 2.0 standard drinks    Types: 1 - 2 Shots of liquor per week  . Drug use: No  . Sexual activity: Not on file  Lifestyle  . Physical activity:    Days per week: 0 days     Minutes per session: 0 min  . Stress: Not at all  Relationships  . Social connections:    Talks on phone: Patient refused    Gets together: Patient refused    Attends religious service: Patient refused    Active member of club or organization: Patient refused    Attends meetings of clubs or organizations: Patient refused    Relationship status: Patient refused  . Intimate partner violence:    Fear of current or ex partner: Patient refused    Emotionally abused: Patient refused    Physically abused: Patient refused    Forced sexual activity: Patient refused  Other Topics Concern  . 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 Coronary artery disease    a. s/p NSTEMI with DES to LAD 06/2008.  Marland Kitchen HTN (hypertension)   . Hyperlipidemia   . Kidney stones   . LV dysfunction    a. EF 40% at time of cath 06/2008, improved to normal on f/u echo.     Patient Active Problem List   Diagnosis Date Noted  . HTN (hypertension) 09/01/2012  . Hyperlipidemia 06/28/2008  . CAD, NATIVE VESSEL 06/28/2008  . NEPHROLITHIASIS, HX OF 06/28/2008    Past Surgical History:  Procedure Laterality Date  . CAROTID STENT  2010  . CATARACT EXTRACTION, BILATERAL    . EYE SURGERY    . INTRAOCULAR LENS INSERTION Right 10/22/2016   [facility]    His family history includes Diabetes in his brother; Heart attack (age of onset: 32) in his father.      Current Outpatient Medications:  .  aspirin 81 MG tablet, Take 1 tablet (81 mg total) by mouth daily., Disp: 30 tablet, Rfl: 6 .  carvedilol (COREG) 6.25 MG tablet, TAKE 1 TABLET BY MOUTH TWICE A DAY WITH A MEAL, Disp: 180 tablet, Rfl: 3 .  famotidine (PEPCID) 20 MG tablet, TAKE 1 TABLET BY MOUTH 2 (TWO) TIMES DAILY., Disp: 180 tablet, Rfl: 3 .  losartan (COZAAR) 50 MG tablet, Take 1  tablet (50 mg total) by mouth 2 (two) times daily., Disp: 180 tablet, Rfl: 3 .  nitroGLYCERIN (NITROSTAT) 0.4 MG SL tablet, PLACE 1 TABLET UNDER THE TONGUE EVERY 5 MINUTES AS NEEDED FOR CHEST PAIN,* Jonathan REPEAT UP TO 3 TIMES, Disp: 75 tablet, Rfl: 1 .  simvastatin (ZOCOR) 20 MG tablet, TAKE 1 TABLET BY MOUTH DAILY., Disp: 90 tablet, Rfl: 2  Patient Care Team: Birdie Sons, MD as PCP - General (Family Medicine) Burnell Blanks, MD as PCP - Cardiology (Cardiology) Emmaline Kluver., MD as Consulting Physician (Rheumatology) Burnell Blanks, MD as Consulting Physician (Cardiology)     Objective:   Vitals: BP (!) 156/90 (BP Location: Right Arm, Patient Position: Sitting, Cuff Size: Normal)   Pulse (!) 59   Temp  97.6 F (36.4 C) (Oral)   Ht 6\' 1"  (1.854 m)   Wt 239 lb 6.4 oz (108.6 kg)   SpO2 96%   BMI 31.59 kg/m   Physical Exam   General Appearance:    Alert, cooperative, no distress, appears stated age  Head:    Normocephalic, without obvious abnormality, atraumatic  Eyes:    PERRL, conjunctiva/corneas clear, EOM's intact, fundi    benign, both eyes       Ears:    Normal TM's and external ear canals, both ears  Nose:   Nares normal, septum midline, mucosa normal, no drainage   or sinus tenderness  Throat:   Lips, mucosa, and tongue normal; teeth and gums normal  Neck:   Supple, symmetrical, trachea midline, no adenopathy;       thyroid:  No enlargement/tenderness/nodules; no carotid   bruit or JVD  Back:     Symmetric, no curvature, ROM normal, no CVA tenderness  Lungs:     Clear to auscultation bilaterally, respirations unlabored  Chest wall:    No tenderness or deformity  Heart:    Regular rate and rhythm, S1 and S2 normal, no murmur, rub   or gallop  Abdomen:     Soft, non-tender, bowel sounds active all four quadrants,    no masses, no organomegaly  Genitalia:    deferred  Rectal:    deferred  Extremities:   Extremities normal, atraumatic, no  cyanosis or edema  Pulses:   2+ and symmetric all extremities  Skin:   Skin color, texture, turgor normal, no rashes or lesions  Lymph nodes:   Cervical, supraclavicular, and axillary nodes normal  Neurologic:   CNII-XII intact. Normal strength, sensation and reflexes      throughout    Activities of Daily Living In your present state of health, do you have any difficulty performing the following activities: 03/19/2018 01/27/2018  Hearing? Y Y  Comment - Wears hearing aids.   Vision? N N  Comment - Wears readers.   Difficulty concentrating or making decisions? N N  Walking or climbing stairs? N N  Dressing or bathing? N N  Doing errands, shopping? N N  Preparing Food and eating ? - N  Using the Toilet? - N  In the past six months, have you accidently leaked urine? - N  Do you have problems with loss of bowel control? - N  Managing your Medications? - N  Managing your Finances? - N  Housekeeping or managing your Housekeeping? - N  Some recent data might be hidden    Fall Risk Assessment Fall Risk  03/19/2018 01/27/2018 01/24/2017 02/01/2016  Falls in the past year? 0 0 No No     Depression Screen PHQ 2/9 Scores 03/19/2018 01/27/2018 01/24/2017 02/01/2016  PHQ - 2 Score 0 0 0 0    6CIT Screen 01/27/2018  What Year? 0 points  What month? 0 points  What time? 0 points  Count back from 20 0 points  Months in reverse 0 points  Repeat phrase 0 points  Total Score 0      Assessment & Plan:    Annual Physical Reviewed patient's Family Medical History Reviewed and updated list of patient's medical providers Assessment of cognitive impairment was done Assessed patient's functional ability Established a written schedule for health screening [place] Completed and Reviewed  Exercise Activities and Dietary recommendations Goals    . Increase water intake     Starting 02/01/16, I will increase  my water intake to 4 glasses a day.    . Weight (lb) < 215  lb (97.5 kg)     Recommend to continue exercising and increase to 3 days a week for at least 30 minutes.       Immunization History  Administered Date(s) Administered  . Influenza-Unspecified 12/20/2015, 12/21/2016, 01/06/2018  . Pneumococcal Conjugate-13 02/01/2016  . Pneumococcal Polysaccharide-23 01/24/2017  . Tdap 07/18/2007    Health Maintenance  Topic Date Due  . TETANUS/TDAP  07/17/2017  . COLONOSCOPY  07/18/2018  . INFLUENZA VACCINE  Completed  . Hepatitis C Screening  Completed  . PNA vac Low Risk Adult  Completed     Discussed health benefits of physical activity, and encouraged him to engage in regular exercise appropriate for his age and condition.    ------------------------------------------------------------------------------------------------------------  1. Annual physical exam   2. Prostate cancer screening  - PSA  3. Essential hypertension Tolerating increased dose of losartan and reports home BP much better than office BP. He is going to check more frequently and notify me if home SBP consistently >140.   4. Atherosclerosis of native coronary artery of native heart without angina pectoris Asymptomatic. Compliant with medication.  Continue aggressive risk factor modification.  Continue  - Comprehensive metabolic panel - Lipid panel  5. Myalgia due to statin LDL has been near goal on 20mg  statin, but has tried several higher intensity states which he did not tolerate.    Lelon Huh, MD  [facility]

## 2018-03-19 NOTE — Patient Instructions (Addendum)
.   Please bring all of your medications to every appointment so we can make sure that our medication list is the same as yours.    The CDC recommends two doses of Shingrix (the shingles vaccine) separated by 2 to 6 months for adults age 70 years and older. I recommend checking with your pharmacy fplan regarding coverage for this vaccine.   . You are due for a Td (tetanus-diptheria-vaccine) which protects you from tetanus and diptheria. Please check with your insurance plan or pharmacy regarding coverage for this vaccine.

## 2018-03-20 ENCOUNTER — Telehealth: Payer: Self-pay

## 2018-03-20 LAB — COMPREHENSIVE METABOLIC PANEL
ALT: 50 IU/L — ABNORMAL HIGH (ref 0–44)
AST: 32 IU/L (ref 0–40)
Albumin/Globulin Ratio: 2.1 (ref 1.2–2.2)
Albumin: 4.6 g/dL (ref 3.6–4.8)
Alkaline Phosphatase: 78 IU/L (ref 39–117)
BUN/Creatinine Ratio: 17 (ref 10–24)
BUN: 24 mg/dL (ref 8–27)
Bilirubin Total: 0.6 mg/dL (ref 0.0–1.2)
CO2: 20 mmol/L (ref 20–29)
Calcium: 9.4 mg/dL (ref 8.6–10.2)
Chloride: 107 mmol/L — ABNORMAL HIGH (ref 96–106)
Creatinine, Ser: 1.42 mg/dL — ABNORMAL HIGH (ref 0.76–1.27)
GFR calc Af Amer: 58 mL/min/{1.73_m2} — ABNORMAL LOW (ref >59)
GFR calc non Af Amer: 50 mL/min/{1.73_m2} — ABNORMAL LOW (ref >59)
Globulin, Total: 2.2 g/dL (ref 1.5–4.5)
Glucose: 100 mg/dL — ABNORMAL HIGH (ref 65–99)
Potassium: 4.7 mmol/L (ref 3.5–5.2)
Sodium: 143 mmol/L (ref 134–144)
Total Protein: 6.8 g/dL (ref 6.0–8.5)

## 2018-03-20 LAB — LIPID PANEL
Chol/HDL Ratio: 3.1 ratio (ref 0.0–5.0)
Cholesterol, Total: 157 mg/dL (ref 100–199)
HDL: 51 mg/dL (ref >39)
LDL Calculated: 85 mg/dL (ref 0–99)
TRIGLYCERIDES: 103 mg/dL (ref 0–149)
VLDL Cholesterol Cal: 21 mg/dL (ref 5–40)

## 2018-03-20 LAB — PSA: Prostate Specific Ag, Serum: 0.4 ng/mL (ref 0.0–4.0)

## 2018-03-20 NOTE — Telephone Encounter (Signed)
Pt advised.   Thanks,   -Brynlee Pennywell  

## 2018-03-20 NOTE — Telephone Encounter (Signed)
-----   Message from Birdie Sons, MD sent at 03/20/2018  8:01 AM EST ----- PSA, blood sugar, kidney functions, electrolytes and cholesterol are all normal. Continue current medications.   Check labs yearly.

## 2018-04-20 ENCOUNTER — Other Ambulatory Visit: Payer: Self-pay | Admitting: Cardiovascular Disease

## 2018-04-20 DIAGNOSIS — I10 Essential (primary) hypertension: Secondary | ICD-10-CM

## 2018-04-20 DIAGNOSIS — I2510 Atherosclerotic heart disease of native coronary artery without angina pectoris: Secondary | ICD-10-CM

## 2018-05-08 ENCOUNTER — Other Ambulatory Visit: Payer: Self-pay | Admitting: Cardiovascular Disease

## 2018-05-25 ENCOUNTER — Other Ambulatory Visit: Payer: Self-pay | Admitting: Cardiovascular Disease

## 2018-07-29 DIAGNOSIS — H40023 Open angle with borderline findings, high risk, bilateral: Secondary | ICD-10-CM | POA: Diagnosis not present

## 2018-07-29 DIAGNOSIS — H5203 Hypermetropia, bilateral: Secondary | ICD-10-CM | POA: Diagnosis not present

## 2018-10-13 ENCOUNTER — Encounter (HOSPITAL_COMMUNITY)
Admission: EM | Disposition: A | Discharge status: Home / Self Care | Payer: Self-pay | Source: Home / Self Care | Attending: Emergency Medicine

## 2018-10-13 ENCOUNTER — Encounter (HOSPITAL_COMMUNITY): Payer: Self-pay | Admitting: Emergency Medicine

## 2018-10-13 ENCOUNTER — Other Ambulatory Visit: Payer: Self-pay

## 2018-10-13 ENCOUNTER — Ambulatory Visit (HOSPITAL_COMMUNITY)
Admission: EM | Admit: 2018-10-13 | Discharge: 2018-10-13 | Disposition: A | Discharge status: Home / Self Care | Payer: Medicare Other | Attending: Orthopedic Surgery | Admitting: Orthopedic Surgery

## 2018-10-13 ENCOUNTER — Emergency Department (HOSPITAL_COMMUNITY): Payer: Medicare Other

## 2018-10-13 ENCOUNTER — Emergency Department (HOSPITAL_COMMUNITY): Payer: Medicare Other | Admitting: Anesthesiology

## 2018-10-13 DIAGNOSIS — S62632A Displaced fracture of distal phalanx of right middle finger, initial encounter for closed fracture: Secondary | ICD-10-CM | POA: Diagnosis not present

## 2018-10-13 DIAGNOSIS — I4430 Unspecified atrioventricular block: Secondary | ICD-10-CM | POA: Diagnosis not present

## 2018-10-13 DIAGNOSIS — I252 Old myocardial infarction: Secondary | ICD-10-CM | POA: Diagnosis not present

## 2018-10-13 DIAGNOSIS — R079 Chest pain, unspecified: Secondary | ICD-10-CM | POA: Diagnosis not present

## 2018-10-13 DIAGNOSIS — R001 Bradycardia, unspecified: Secondary | ICD-10-CM | POA: Diagnosis not present

## 2018-10-13 DIAGNOSIS — I2510 Atherosclerotic heart disease of native coronary artery without angina pectoris: Secondary | ICD-10-CM | POA: Insufficient documentation

## 2018-10-13 DIAGNOSIS — S62634A Displaced fracture of distal phalanx of right ring finger, initial encounter for closed fracture: Secondary | ICD-10-CM | POA: Diagnosis not present

## 2018-10-13 DIAGNOSIS — S61314A Laceration without foreign body of right ring finger with damage to nail, initial encounter: Secondary | ICD-10-CM | POA: Diagnosis not present

## 2018-10-13 DIAGNOSIS — E7800 Pure hypercholesterolemia, unspecified: Secondary | ICD-10-CM | POA: Diagnosis not present

## 2018-10-13 DIAGNOSIS — S62630A Displaced fracture of distal phalanx of right index finger, initial encounter for closed fracture: Secondary | ICD-10-CM | POA: Diagnosis not present

## 2018-10-13 DIAGNOSIS — I10 Essential (primary) hypertension: Secondary | ICD-10-CM | POA: Diagnosis not present

## 2018-10-13 DIAGNOSIS — Z20828 Contact with and (suspected) exposure to other viral communicable diseases: Secondary | ICD-10-CM | POA: Insufficient documentation

## 2018-10-13 DIAGNOSIS — S61310A Laceration without foreign body of right index finger with damage to nail, initial encounter: Secondary | ICD-10-CM | POA: Diagnosis not present

## 2018-10-13 DIAGNOSIS — Z955 Presence of coronary angioplasty implant and graft: Secondary | ICD-10-CM | POA: Insufficient documentation

## 2018-10-13 DIAGNOSIS — S62634B Displaced fracture of distal phalanx of right ring finger, initial encounter for open fracture: Secondary | ICD-10-CM | POA: Insufficient documentation

## 2018-10-13 DIAGNOSIS — S62632B Displaced fracture of distal phalanx of right middle finger, initial encounter for open fracture: Secondary | ICD-10-CM | POA: Insufficient documentation

## 2018-10-13 DIAGNOSIS — Z03818 Encounter for observation for suspected exposure to other biological agents ruled out: Secondary | ICD-10-CM | POA: Diagnosis not present

## 2018-10-13 DIAGNOSIS — W312XXA Contact with powered woodworking and forming machines, initial encounter: Secondary | ICD-10-CM | POA: Insufficient documentation

## 2018-10-13 DIAGNOSIS — Z23 Encounter for immunization: Secondary | ICD-10-CM | POA: Diagnosis not present

## 2018-10-13 DIAGNOSIS — Z7982 Long term (current) use of aspirin: Secondary | ICD-10-CM | POA: Diagnosis not present

## 2018-10-13 DIAGNOSIS — S62624A Displaced fracture of medial phalanx of right ring finger, initial encounter for closed fracture: Secondary | ICD-10-CM | POA: Diagnosis not present

## 2018-10-13 DIAGNOSIS — S62630B Displaced fracture of distal phalanx of right index finger, initial encounter for open fracture: Secondary | ICD-10-CM | POA: Insufficient documentation

## 2018-10-13 DIAGNOSIS — S62602A Fracture of unspecified phalanx of right middle finger, initial encounter for closed fracture: Secondary | ICD-10-CM | POA: Diagnosis not present

## 2018-10-13 DIAGNOSIS — S61312A Laceration without foreign body of right middle finger with damage to nail, initial encounter: Secondary | ICD-10-CM | POA: Diagnosis not present

## 2018-10-13 HISTORY — PX: I & D EXTREMITY: SHX5045

## 2018-10-13 LAB — BASIC METABOLIC PANEL
Anion gap: 10 (ref 5–15)
BUN: 14 mg/dL (ref 8–23)
CO2: 24 mmol/L (ref 22–32)
Calcium: 9.1 mg/dL (ref 8.9–10.3)
Chloride: 106 mmol/L (ref 98–111)
Creatinine, Ser: 1.36 mg/dL — ABNORMAL HIGH (ref 0.61–1.24)
GFR calc Af Amer: >60 mL/min (ref >60)
GFR calc non Af Amer: 52 mL/min — ABNORMAL LOW (ref >60)
Glucose, Bld: 97 mg/dL (ref 70–99)
Potassium: 4.5 mmol/L (ref 3.5–5.1)
Sodium: 140 mmol/L (ref 135–145)

## 2018-10-13 LAB — CBC WITH DIFFERENTIAL/PLATELET
Abs Immature Granulocytes: 0.02 10*3/uL (ref 0.00–0.07)
Basophils Absolute: 0.1 10*3/uL (ref 0.0–0.1)
Basophils Relative: 1 %
Eosinophils Absolute: 0.5 10*3/uL (ref 0.0–0.5)
Eosinophils Relative: 6 %
HCT: 42.4 % (ref 39.0–52.0)
Hemoglobin: 14.7 g/dL (ref 13.0–17.0)
Immature Granulocytes: 0 %
Lymphocytes Relative: 16 %
Lymphs Abs: 1.4 10*3/uL (ref 0.7–4.0)
MCH: 33.3 pg (ref 26.0–34.0)
MCHC: 34.7 g/dL (ref 30.0–36.0)
MCV: 96.1 fL (ref 80.0–100.0)
Monocytes Absolute: 0.7 10*3/uL (ref 0.1–1.0)
Monocytes Relative: 8 %
Neutro Abs: 5.8 10*3/uL (ref 1.7–7.7)
Neutrophils Relative %: 69 %
Platelets: 170 10*3/uL (ref 150–400)
RBC: 4.41 MIL/uL (ref 4.22–5.81)
RDW: 12.2 % (ref 11.5–15.5)
WBC: 8.4 10*3/uL (ref 4.0–10.5)
nRBC: 0.2 % (ref 0.0–0.2)

## 2018-10-13 LAB — SARS CORONAVIRUS 2 BY RT PCR (HOSPITAL ORDER, PERFORMED IN ~~LOC~~ HOSPITAL LAB): SARS Coronavirus 2: NEGATIVE

## 2018-10-13 SURGERY — IRRIGATION AND DEBRIDEMENT EXTREMITY
Anesthesia: General | Laterality: Right

## 2018-10-13 MED ORDER — BUPIVACAINE HCL (PF) 0.25 % IJ SOLN
INTRAMUSCULAR | Status: AC
  Filled 2018-10-13: qty 30

## 2018-10-13 MED ORDER — MIDAZOLAM HCL 5 MG/5ML IJ SOLN
INTRAMUSCULAR | Status: DC | PRN
  Administered 2018-10-13: 2 mg via INTRAVENOUS

## 2018-10-13 MED ORDER — EPHEDRINE SULFATE 50 MG/ML IJ SOLN
INTRAMUSCULAR | Status: DC | PRN
  Administered 2018-10-13 (×2): 5 mg via INTRAVENOUS

## 2018-10-13 MED ORDER — LIDOCAINE HCL (CARDIAC) PF 100 MG/5ML IV SOSY
PREFILLED_SYRINGE | INTRAVENOUS | Status: DC | PRN
  Administered 2018-10-13: 60 mg via INTRATRACHEAL

## 2018-10-13 MED ORDER — CHLORHEXIDINE GLUCONATE 4 % EX LIQD
60.0000 mL | Freq: Once | CUTANEOUS | Status: DC
  Filled 2018-10-13: qty 60

## 2018-10-13 MED ORDER — 0.9 % SODIUM CHLORIDE (POUR BTL) OPTIME
TOPICAL | Status: DC | PRN
  Administered 2018-10-13: 19:00:00 1000 mL

## 2018-10-13 MED ORDER — BUPIVACAINE HCL (PF) 0.25 % IJ SOLN
INTRAMUSCULAR | Status: DC | PRN
  Administered 2018-10-13: 17 mL

## 2018-10-13 MED ORDER — PROPOFOL 10 MG/ML IV BOLUS
INTRAVENOUS | Status: AC
  Filled 2018-10-13: qty 20

## 2018-10-13 MED ORDER — LACTATED RINGERS IV SOLN
INTRAVENOUS | Status: DC | PRN
  Administered 2018-10-13: 19:00:00 via INTRAVENOUS

## 2018-10-13 MED ORDER — ONDANSETRON HCL 4 MG/2ML IJ SOLN: 4.0000 mg | Freq: Once | INTRAMUSCULAR | Status: DC | PRN

## 2018-10-13 MED ORDER — OXYCODONE HCL 5 MG/5ML PO SOLN: 5.0000 mg | Freq: Once | ORAL | Status: DC | PRN

## 2018-10-13 MED ORDER — SODIUM CHLORIDE 0.9 % IV SOLN
INTRAVENOUS | Status: DC
  Administered 2018-10-13: 18:00:00 via INTRAVENOUS

## 2018-10-13 MED ORDER — HYDROCODONE-ACETAMINOPHEN 5-325 MG PO TABS: ORAL_TABLET | ORAL | 0 refills | Status: DC

## 2018-10-13 MED ORDER — PROPOFOL 10 MG/ML IV BOLUS
INTRAVENOUS | Status: DC | PRN
  Administered 2018-10-13: 150 mg via INTRAVENOUS

## 2018-10-13 MED ORDER — SULFAMETHOXAZOLE-TRIMETHOPRIM 800-160 MG PO TABS: 1.0000 | ORAL_TABLET | Freq: Two times a day (BID) | ORAL | 0 refills | Status: DC

## 2018-10-13 MED ORDER — FENTANYL CITRATE (PF) 100 MCG/2ML IJ SOLN: 25.0000 ug | INTRAMUSCULAR | Status: DC | PRN

## 2018-10-13 MED ORDER — CEFAZOLIN SODIUM-DEXTROSE 2-4 GM/100ML-% IV SOLN
2.0000 g | INTRAVENOUS | Status: AC
  Administered 2018-10-13: 2 g via INTRAVENOUS
  Filled 2018-10-13: qty 100

## 2018-10-13 MED ORDER — FENTANYL CITRATE (PF) 250 MCG/5ML IJ SOLN
INTRAMUSCULAR | Status: AC
  Filled 2018-10-13: qty 5

## 2018-10-13 MED ORDER — FENTANYL CITRATE (PF) 100 MCG/2ML IJ SOLN
50.0000 ug | Freq: Once | INTRAMUSCULAR | Status: AC
  Administered 2018-10-13: 16:00:00 50 ug via INTRAVENOUS
  Filled 2018-10-13: qty 2

## 2018-10-13 MED ORDER — ONDANSETRON HCL 4 MG/2ML IJ SOLN
INTRAMUSCULAR | Status: DC | PRN
  Administered 2018-10-13: 4 mg via INTRAVENOUS

## 2018-10-13 MED ORDER — LACTATED RINGERS IV SOLN: INTRAVENOUS | Status: DC

## 2018-10-13 MED ORDER — MIDAZOLAM HCL 2 MG/2ML IJ SOLN
INTRAMUSCULAR | Status: AC
  Filled 2018-10-13: qty 2

## 2018-10-13 MED ORDER — OXYCODONE HCL 5 MG PO TABS: 5.0000 mg | ORAL_TABLET | Freq: Once | ORAL | Status: DC | PRN

## 2018-10-13 MED ORDER — FENTANYL CITRATE (PF) 250 MCG/5ML IJ SOLN
INTRAMUSCULAR | Status: DC | PRN
  Administered 2018-10-13 (×2): 50 ug via INTRAVENOUS

## 2018-10-13 MED ORDER — POVIDONE-IODINE 10 % EX SWAB: 2.0000 "application " | Freq: Once | CUTANEOUS | Status: DC

## 2018-10-13 SURGICAL SUPPLY — 53 items
BLADE MINI 60D BLUE (BLADE) ×2 IMPLANT
BNDG COHESIVE 2X5 TAN STRL LF (GAUZE/BANDAGES/DRESSINGS) IMPLANT
BNDG ELASTIC 3X5.8 VLCR STR LF (GAUZE/BANDAGES/DRESSINGS) ×3 IMPLANT
BNDG ELASTIC 4X5.8 VLCR STR LF (GAUZE/BANDAGES/DRESSINGS) ×3 IMPLANT
BNDG ESMARK 4X9 LF (GAUZE/BANDAGES/DRESSINGS) IMPLANT
BNDG GAUZE ELAST 4 BULKY (GAUZE/BANDAGES/DRESSINGS) ×3 IMPLANT
CORD BIPOLAR FORCEPS 12FT (ELECTRODE) ×3 IMPLANT
COVER SURGICAL LIGHT HANDLE (MISCELLANEOUS) ×3 IMPLANT
COVER WAND RF STERILE (DRAPES) ×3 IMPLANT
CUFF TOURN SGL QUICK 18X4 (TOURNIQUET CUFF) IMPLANT
CUFF TOURN SGL QUICK 24 (TOURNIQUET CUFF)
CUFF TRNQT CYL 24X4X16.5-23 (TOURNIQUET CUFF) IMPLANT
DECANTER SPIKE VIAL GLASS SM (MISCELLANEOUS) ×3 IMPLANT
DRAIN PENROSE 1/4X12 LTX STRL (WOUND CARE) IMPLANT
DRSG PAD ABDOMINAL 8X10 ST (GAUZE/BANDAGES/DRESSINGS) ×6 IMPLANT
DRSG XEROFORM 1X8 (GAUZE/BANDAGES/DRESSINGS) ×2 IMPLANT
GAUZE SPONGE 4X4 12PLY STRL (GAUZE/BANDAGES/DRESSINGS) ×3 IMPLANT
GAUZE XEROFORM 1X8 LF (GAUZE/BANDAGES/DRESSINGS) ×3 IMPLANT
GLOVE BIO SURGEON STRL SZ7.5 (GLOVE) ×3 IMPLANT
GLOVE BIOGEL PI IND STRL 8 (GLOVE) ×1 IMPLANT
GLOVE BIOGEL PI INDICATOR 8 (GLOVE) ×2
GOWN STRL REUS W/ TWL LRG LVL3 (GOWN DISPOSABLE) ×1 IMPLANT
GOWN STRL REUS W/ TWL XL LVL3 (GOWN DISPOSABLE) ×1 IMPLANT
GOWN STRL REUS W/TWL LRG LVL3 (GOWN DISPOSABLE) ×2
GOWN STRL REUS W/TWL XL LVL3 (GOWN DISPOSABLE) ×2
KIT BASIN OR (CUSTOM PROCEDURE TRAY) ×3 IMPLANT
KIT TURNOVER KIT B (KITS) ×3 IMPLANT
LOOP VESSEL MAXI BLUE (MISCELLANEOUS) IMPLANT
MANIFOLD NEPTUNE II (INSTRUMENTS) IMPLANT
NDL HYPO 25X1 1.5 SAFETY (NEEDLE) IMPLANT
NEEDLE HYPO 25X1 1.5 SAFETY (NEEDLE) IMPLANT
NS IRRIG 1000ML POUR BTL (IV SOLUTION) ×3 IMPLANT
PACK ORTHO EXTREMITY (CUSTOM PROCEDURE TRAY) ×3 IMPLANT
PAD ARMBOARD 7.5X6 YLW CONV (MISCELLANEOUS) ×6 IMPLANT
PAD CAST 4YDX4 CTTN HI CHSV (CAST SUPPLIES) IMPLANT
PADDING CAST COTTON 4X4 STRL (CAST SUPPLIES) ×2
SET CYSTO W/LG BORE CLAMP LF (SET/KITS/TRAYS/PACK) IMPLANT
SOL PREP POV-IOD 4OZ 10% (MISCELLANEOUS) ×6 IMPLANT
SPLINT PLASTER EXTRA FAST 3X15 (CAST SUPPLIES) ×2
SPLINT PLASTER GYPS XFAST 3X15 (CAST SUPPLIES) IMPLANT
SPONGE LAP 4X18 RFD (DISPOSABLE) ×3 IMPLANT
SUT ETHILON 4 0 P 3 18 (SUTURE) IMPLANT
SUT ETHILON 4 0 PS 2 18 (SUTURE) IMPLANT
SUT MON AB 5-0 P3 18 (SUTURE) IMPLANT
SWAB COLLECTION DEVICE MRSA (MISCELLANEOUS) IMPLANT
SWAB CULTURE ESWAB REG 1ML (MISCELLANEOUS) IMPLANT
SYR CONTROL 10ML LL (SYRINGE) IMPLANT
TOWEL GREEN STERILE (TOWEL DISPOSABLE) ×3 IMPLANT
TUBE CONNECTING 12'X1/4 (SUCTIONS) ×1
TUBE CONNECTING 12X1/4 (SUCTIONS) ×2 IMPLANT
TUBE FEEDING ENTERAL 5FR 16IN (TUBING) IMPLANT
UNDERPAD 30X30 (UNDERPADS AND DIAPERS) ×3 IMPLANT
YANKAUER SUCT BULB TIP NO VENT (SUCTIONS) ×3 IMPLANT

## 2018-10-13 NOTE — ED Notes (Signed)
Removed pt's clothes and gave to pt's wife. Report called to Short stay RN.

## 2018-10-13 NOTE — ED Provider Notes (Signed)
[facility] EMERGENCY DEPARTMENT Provider Note   CSN: [id number] Arrival date & time: 10/13/18  1456    History   Chief Complaint Chief Complaint  Patient presents with  . Finger Injury    3 fingers    HPI Jonathan Sharp is a 68 y.o. male.     The history is provided by the patient and medical records. No language interpreter was used.   Jonathan Sharp is a 68 y.o. male  with a PMH as listed below who presents to the Emergency Department for evaluation after injury to the right hand.  Patient was working with a table saw and accidentally got his index, long and ring finger cut by the table saw.  He was seen by outside hospital who did give him 1 g of Ancef as well as updated his tetanus.  He states that they did not clean the wound.  He was told that their facility did not have a hand specialist, therefore was recommended that he come to our emergency department for continued work-up to see specialist.  He reports having some difficulty with range of motion of the index finger, but no difficulty with range of motion to the other digits.  Denies sensation loss.    Past Medical History:  Diagnosis Date  . Coronary artery disease    a. s/p NSTEMI with DES to LAD 06/2008.  Marland Kitchen HTN (hypertension)   . Hyperlipidemia   . Kidney stones   . LV dysfunction    a. EF 40% at time of cath 06/2008, improved to normal on f/u echo.    Patient Active Problem List   Diagnosis Date Noted  . Myalgia due to statin 03/19/2018  . HTN (hypertension) 09/01/2012  . Hyperlipidemia 06/28/2008  . CAD, NATIVE VESSEL 06/28/2008  . NEPHROLITHIASIS, HX OF 06/28/2008    Past Surgical History:  Procedure Laterality Date  . CAROTID STENT  2010  . CATARACT EXTRACTION, BILATERAL    . EYE SURGERY    . INTRAOCULAR LENS INSERTION Right 10/22/2016   [facility]        Home Medications    Prior to Admission medications   Medication Sig Start Date End Date Taking? Authorizing Provider  carvedilol  (COREG) 6.25 MG tablet TAKE 1 TABLET BY MOUTH TWICE A DAY WITH A MEAL 04/23/18  Yes Burnell Blanks, MD  aspirin 81 MG tablet Take 1 tablet (81 mg total) by mouth daily. 04/16/12   Burnell Blanks, MD  famotidine (PEPCID) 20 MG tablet TAKE 1 TABLET BY MOUTH 2 (TWO) TIMES DAILY. 04/23/18   Burnell Blanks, MD  losartan (COZAAR) 50 MG tablet TAKE 1 TABLET BY MOUTH EVERY DAY 05/08/18   Burnell Blanks, MD  nitroGLYCERIN (NITROSTAT) 0.4 MG SL tablet PLACE 1 TABLET UNDER THE TONGUE EVERY 5 MINUTES AS NEEDED FOR CHEST PAIN,* MAY REPEAT UP TO 3 TIMES 05/26/18   Burnell Blanks, MD  simvastatin (ZOCOR) 20 MG tablet TAKE 1 TABLET BY MOUTH DAILY. 03/17/18   Burnell Blanks, MD    Family History Family History  Problem Relation Age of Onset  . Heart attack Father 10  . Diabetes Brother        over weight    Social History Social History   Tobacco Use  . Smoking status: Never Smoker  . Smokeless tobacco: Never Used  Substance Use Topics  . Alcohol use: Yes    Alcohol/week: 1.0 - 2.0 standard drinks    Types: 1 -  2 Shots of liquor per week  . Drug use: No     Allergies   Patient has no known allergies.   Review of Systems Review of Systems  Musculoskeletal: Positive for arthralgias.  Skin: Positive for wound.  All other systems reviewed and are negative.    Physical Exam Updated Vital Signs BP (!) 154/81 (BP Location: Left Arm)   Pulse (!) 51   Temp 98.9 F (37.2 C) (Oral)   Resp 16   Ht 6\' 1"  (1.854 m)   Wt 102.1 kg   SpO2 100%   BMI 29.69 kg/m   Physical Exam Vitals signs and nursing note reviewed.  Constitutional:      General: He is not in acute distress.    Appearance: He is well-developed.  HENT:     Head: Normocephalic and atraumatic.  Neck:     Musculoskeletal: Neck supple.  Cardiovascular:     Rate and Rhythm: Normal rate and regular rhythm.     Heart sounds: Normal heart sounds. No murmur.  Pulmonary:     Effort:  Pulmonary effort is normal. No respiratory distress.     Breath sounds: Normal breath sounds. No wheezing or rales.  Musculoskeletal:     Comments: See image below of right hand. Decreased motion to the DIP joint of index finger. 2+ radial pulse.   Skin:    General: Skin is warm and dry.  Neurological:     Mental Status: He is alert.  Psychiatric:        Mood and Affect: Mood normal.          ED Treatments / Results  Labs (all labs ordered are listed, but only abnormal results are displayed) Labs Reviewed  BASIC METABOLIC PANEL - Abnormal; Notable for the following components:      Result Value   Creatinine, Ser 1.36 (*)    GFR calc non Af Amer 52 (*)    All other components within normal limits  SARS CORONAVIRUS 2 (HOSPITAL ORDER, [facility])  CBC WITH DIFFERENTIAL/PLATELET    EKG None  Radiology Dg Hand Complete Right  Result Date: 10/13/2018 CLINICAL DATA:  Table saw accident EXAM: RIGHT HAND - COMPLETE 3+ VIEW COMPARISON:  10/13/2018 FINDINGS: Vascular calcifications in the soft tissues. Acute severely comminuted fracture diffusely involving the second distal phalanx with multiple displaced bone fragments and extension of fracture lucency to the articular surface of the second DIP joint. Mildly comminuted fracture involving the head of the second middle phalanx. Acute comminuted fracture involving the tuft of the third distal phalanx with multiple displaced bony fragments. Acute comminuted fracture involving the tuft of the fourth distal phalanx, also with mild displacement. None vertical lucency straddling the fourth DIP joint is questionable for fracture. IMPRESSION: 1. Similar appearance of comminuted displaced fracture involving the second distal phalanx and head of the middle phalanx with involvement of the DIP joint. 2. Similar appearance of acute comminuted and displaced fracture involving the tuft of the third distal phalanx 3. Similar  appearance of acute comminuted and mildly displaced tuft fracture fourth distal phalanx. Unusual vertical lucency straddling the fourth DIP joint, questionable for fracture. Electronically Signed   By: Donavan Foil M.D.   On: 10/13/2018 16:48    Procedures Procedures (including critical care time)  Medications Ordered in ED Medications  chlorhexidine (HIBICLENS) 4 % liquid 4 application (has no administration in time range)  povidone-iodine 10 % swab 2 application (has no administration in time range)  ceFAZolin (ANCEF) IVPB 2g/100 mL premix (has no administration in time range)  0.9 %  sodium chloride infusion ( Intravenous New Bag/Given 10/13/18 1819)  fentaNYL (SUBLIMAZE) injection 50 mcg (50 mcg Intravenous Given 10/13/18 1601)  bupivacaine (PF) (MARCAINE) 0.25 % injection (has no administration in time range)  midazolam (VERSED) 2 MG/2ML injection (has no administration in time range)  fentaNYL (SUBLIMAZE) 250 MCG/5ML injection (has no administration in time range)  propofol (DIPRIVAN) 10 mg/mL bolus/IV push (has no administration in time range)     Initial Impression / Assessment and Plan / ED Course  I have reviewed the triage vital signs and the nursing notes.  Pertinent labs & imaging results that were available during my care of the patient were reviewed by me and considered in my medical decision making (see chart for details).       Jonathan Sharp is a 68 y.o. male who presents to ED by recommendation of outside ER for right hand injury 2/2 table saw accident. Open comminuted displaced fractures to index, long and ring fingers. Tdap and Ancef given at outside hospital this am. Discussed with Dr. Fredna Dow of hand surgery. To OR tonight.    Patient discussed with Dr. Kathrynn Humble who agrees with treatment plan.    Final Clinical Impressions(s) / ED Diagnoses   Final diagnoses:  Open displaced fracture of distal phalanx of right index finger, initial encounter  Open displaced  fracture of distal phalanx of right middle finger, initial encounter    ED Discharge Orders    None       Audrinna Sherman, Ozella Almond, PA-C 10/13/18 Berton Mount, MD 10/14/18 2003

## 2018-10-13 NOTE — Discharge Instructions (Signed)

## 2018-10-13 NOTE — Anesthesia Preprocedure Evaluation (Addendum)
Anesthesia Evaluation  Patient identified by MRN, date of birth, ID band Patient awake    Reviewed: Allergy & Precautions, NPO status , Patient's Chart, lab work & pertinent test results  History of Anesthesia Complications Negative for: history of anesthetic complications  Airway Mallampati: II  TM Distance: >3 FB Neck ROM: Full    Dental  (+) Dental Advisory Given, Teeth Intact   Pulmonary neg pulmonary ROS,    breath sounds clear to auscultation       Cardiovascular Exercise Tolerance: Good hypertension, Pt. on medications and Pt. on home beta blockers + CAD and + Cardiac Stents   Rhythm:Regular Rate:Normal   '19 TTE - EF 55% to 60%. Grade 1 diastolic dysfunction. Trivial AI and MR. Mildly dilated aortic root. Aortic root dimension: 39 mm    Neuro/Psych negative neurological ROS  negative psych ROS   GI/Hepatic negative GI ROS, Neg liver ROS,   Endo/Other  negative endocrine ROS  Renal/GU  Renal stones      Musculoskeletal negative musculoskeletal ROS (+)   Abdominal   Peds  Hematology negative hematology ROS (+)   Anesthesia Other Findings   Reproductive/Obstetrics                            Anesthesia Physical Anesthesia Plan  ASA: III  Anesthesia Plan: General   Post-op Pain Management:    Induction: Intravenous  PONV Risk Score and Plan: 2 and Treatment may vary due to age or medical condition, Ondansetron and Dexamethasone  Airway Management Planned: LMA  Additional Equipment: None  Intra-op Plan:   Post-operative Plan: Extubation in OR  Informed Consent: I have reviewed the patients History and Physical, chart, labs and discussed the procedure including the risks, benefits and alternatives for the proposed anesthesia with the patient or authorized representative who has indicated his/her understanding and acceptance.     Dental advisory given  Plan  Discussed with: CRNA and Anesthesiologist  Anesthesia Plan Comments:        Anesthesia Quick Evaluation

## 2018-10-13 NOTE — Consult Note (Addendum)
Reason for Consult:Right hand injury Referring Physician: [name] is an 68 y.o. male.  HPI: Jonathan Sharp Sharp cut his right hand on Jonathan Sharp table saw today. He went to the ED at [facility] and was then transferred to [facility] for definitive care. He is RHD. He c/o localized pain to the fingers.  Past Medical History:  Diagnosis Date  . Coronary artery disease    Jonathan Sharp. s/p NSTEMI with DES to LAD 06/2008.  Marland Kitchen HTN (hypertension)   . Hyperlipidemia   . Kidney stones   . LV dysfunction    Jonathan Sharp. EF 40% at time of cath 06/2008, improved to normal on f/u echo.    Past Surgical History:  Procedure Laterality Date  . CAROTID STENT  2010  . CATARACT EXTRACTION, BILATERAL    . EYE SURGERY    . INTRAOCULAR LENS INSERTION Right 10/22/2016   [facility]    Family History  Problem Relation Age of Onset  . Heart attack Father 85  . Diabetes Brother        over weight    Social History:  reports that he has never smoked. He has never used smokeless tobacco. He reports current alcohol use of about 1.0 - 2.0 standard drinks of alcohol per week. He reports that he does not use drugs.  Allergies: No Known Allergies  Medications: I have reviewed the patient's current medications.  No results found for this or any previous visit (from the past 48 hour(s)).  No results found.  Review of Systems  Constitutional: Negative for weight loss.  HENT: Negative for ear discharge, ear pain, hearing loss and tinnitus.   Eyes: Negative for blurred vision, double vision, photophobia and pain.  Respiratory: Negative for cough, sputum production and shortness of breath.   Cardiovascular: Negative for chest pain.  Gastrointestinal: Negative for abdominal pain, nausea and vomiting.  Genitourinary: Negative for dysuria, flank pain, frequency and urgency.  Musculoskeletal: Positive for joint pain (Right hand). Negative for back pain, falls, myalgias and neck pain.  Neurological: Negative for dizziness, tingling, sensory change, focal  weakness, loss of consciousness and headaches.  Endo/Heme/Allergies: Does not bruise/bleed easily.  Psychiatric/Behavioral: Negative for depression, memory loss and substance abuse. The patient is not nervous/anxious.    Blood pressure (!) 170/140, pulse 83, temperature 98.9 F (37.2 C), temperature source Oral, resp. rate 19, height 6\' 1"  (1.854 m), weight 102.1 kg, SpO2 97 %. Physical Exam  Constitutional: He appears well-developed and well-nourished. No distress.  HENT:  Head: Normocephalic and atraumatic.  Eyes: Conjunctivae are normal. Right eye exhibits no discharge. Left eye exhibits no discharge. No scleral icterus.  Neck: Normal range of motion.  Cardiovascular: Normal rate and regular rhythm.  Respiratory: Effort normal. No respiratory distress.  Musculoskeletal:     Comments: Right shoulder, elbow, wrist, digits- Longitudinal dorsal lacs to 2-4th fingers P3, DIP extension of index 0/5, severe TTP, sensation intact for all 3, no instability, no blocks to motion  Sens  Ax/R/M/U intact  Mot   Ax/ R/ PIN/ M/ AIN/ U intact  Rad 2+  Neurological: He is alert.  Skin: Skin is warm and dry. He is not diaphoretic.  Psychiatric: He has Jonathan Sharp normal mood and affect. His behavior is normal.    Assessment/Plan: Right hand injury -- Plan for OR tonight by Dr. Fredna Sharp. NPO until then. Anticipate discharge after surgery.    Lisette Abu, PA-C Orthopedic Surgery [phone] 10/13/2018, 4:06 PM   Patient seen and examined.  Sustained table saw  injury to right index/long/ring fingers earlier today.  Seen at OSH and transferred to [facility] for further care.  Reports pain in the fingers and rates it at 4/10 severity.  Alleviated with rest and aggravated with palpation.  There is associated bleeding.  He notes no previous injury to right hand.    Exam: Intact sensation and capillary refill all digits.  +epl/fpl/io.  Lacerations through nail of index, long, and ring fingers.  Distal pad lacerations  as well.  Laceration in ring finger goes into middle phalanx.  Still with good extension of dip joints and can hold against resistance in ring finger.    XR: 3 views right hand show comminuted fracture index distal phalanx, tuft fracture in long finger, and kerf of bone removed dorsally in middle and distal phalanges of ring finger.    Plan: right index, long, and ring finger table saw injury.  Recommend OR for irrigation and debridement of wounds and open fractures, reduction of fractures with possible pin fixation, repair skin and nail bed lacerations.  Risks, benefits and alternatives of surgery were discussed including risks of blood loss, infection, damage to nerves/vessels/tendons/ligament/bone, failure of surgery, need for additional surgery, complication with wound healing, stiffness, nail deformity.  He voiced understanding of these risks and elected to proceed.

## 2018-10-13 NOTE — Op Note (Signed)
NAME: Jonathan Sharp MEDICAL RECORD NO: [id number] DATE OF BIRTH: 06-Apr-1950 FACILITY: [facility] LOCATION: [facility] OR PHYSICIAN: Tennis Must, MD   OPERATIVE REPORT   DATE OF PROCEDURE: 10/13/18    PREOPERATIVE DIAGNOSIS:   Right index long and ring finger table saw injuries   POSTOPERATIVE DIAGNOSIS:   Right index long and ring finger tablesaw injuries with comminuted open distal phalanx fracture of index finger, open tuft fracture of long finger, open fractures of ring finger middle and distal phalanges with bone loss, skin and nailbed lacerations of index long and ring fingers   PROCEDURE:   1.  Right index finger irrigation and debridement of open distal phalanx fracture including skin subcutaneous tissues and tissue deep to fascia 2.  Right index finger open reduction of open distal phalanx fracture 3.  Right index finger repair of skin and nailbed lacerations 4.  Right long finger irrigation and debridement of open distal phalanx tuft fracture including skin subcutaneous tissues and tissue deep to fascia 5.  Right long finger repair of skin and nailbed lacerations  6.  Right long finger open treatment open distal phalanx tuft fracture 7.  Right ring finger irrigation and debridement of open middle and distal phalangeal fractures with bone loss including skin subcutaneous tissues and tissue deep to fascia 8.  Right ring finger open treatment open middle and distal phalangeal fractures 9.  Right ring finger repair of skin and nailbed lacerations  SURGEON:  Leanora Cover, M.D.   ASSISTANT: none   ANESTHESIA:  General   INTRAVENOUS FLUIDS:  Per anesthesia flow sheet.   ESTIMATED BLOOD LOSS:  Minimal.   COMPLICATIONS:  None.   SPECIMENS:  none   TOURNIQUET TIME:    Total Tourniquet Time Documented: Upper Arm (Right) - 69 minutes Total: Upper Arm (Right) - 69 minutes    DISPOSITION:  Stable to PACU.   INDICATIONS: 68 year old male states he was using a table saw earlier today  when he sustained lacerations to the index long and ring fingers of the right hand.  Was seen at outside facility where radiographs were taken revealing fractures of the index long and ring fingers.  He was transferred to [facility] for further care.  Recommended operative irrigation debridement of the open fractures with repair of skin and nailbed lacerations. Risks, benefits and alternatives of surgery were discussed including the risks of blood loss, infection, damage to nerves, vessels, tendons, ligaments, bone for surgery, need for additional surgery, complications with wound healing, continued pain, nonunion, malunion, stiffness, nail deformity.  He voiced understanding of these risks and elected to proceed.  OPERATIVE COURSE:  After being identified preoperatively by myself,  the patient and I agreed on the procedure and site of the procedure.  The surgical site was marked.  Surgical consent had been signed. He was given IV antibiotics as preoperative antibiotic prophylaxis. He was transferred to the operating room and placed on the operating table in supine position with the Right upper extremity on an arm board.  General anesthesia was induced by the anesthesiologist.  Right upper extremity was prepped and draped in normal sterile orthopedic fashion.  A surgical pause was performed between the surgeons, anesthesia, and operating room staff and all were in agreement as to the patient, procedure, and site of procedure.  Tourniquet at the proximal aspect of the extremity was inflated to 250 mmHg after exsanguination of the arm with an Esmarch bandage.    The wounds were explored.  There is no gross  contamination.  In the index finger there was bone loss of the distal phalanx.  There was a small plane of bone remaining underneath the nail.  There was nothing that was adequate enough for pin fixation.  The wound and fracture were debrided using the pickups [employer] sponge and knife including skin subcutaneous  tissues and tissue deep to fascia against the bone.  In the long finger there was open tuft fracture with bone loss.  This was also debrided of skin subcutaneous tissues and tissue deep to fascia adjacent to the bone to remove contaminated hematoma and to sharply remove devitalized tissues with the knife.  In the ring finger there is a longitudinal laceration through the nailbed along the ulnar side of the distal and middle phalanges.  There is a curve of bone missing dorsally.  This area was debrided of contaminated hematoma.  Knife was used to remove devitalized tissues at the distal phalanx including skin subcutaneous tissues and tissue deep to fascia.  The wounds and fractures were all copiously irrigated with sterile saline by cystoscopy tubing.  5-0 Monocryl suture was used to reapproximate skin edges in the index finger.  The ulnar side of the nail was removed leaving the radial side as a splint.  The nailbed was reapproximated with 6-0 chromic suture.  Good reapproximation was obtained.  The nails were removed of the long and ring fingers as well using a freer elevator.  There was a nailbed loss.  A Beaver blade was used to mobilize the remaining nailbed at the radial side of the long finger.  There was minimal nailbed remaining at the ulnar side.  This was adequate to be able to reapproximate the remaining portion of nailbed.  It was felt that a split thickness nailbed graft would not survive due to the loss of bone underneath.  6-0 chromic suture was used to reapproximate the nailbed.  5-0 chromic suture was used to reapproximate the skin.  In the ring finger again the [facility] blade was used to mobilize the remaining portions of nailbed on both the radial and ulnar sides.  This was adequate to allow mobilization.  The 6-0 chromic suture was used to reapproximate the nailbed.  5-0 Monocryl suture was used to reapproximate skin edges.  Good reapproximation of all soft tissues was obtained.  Xeroform was  placed in the nail fold and the wounds all dressed with sterile Xeroform and 4 x 4's.  The hand was wrapped with Kerlix and a volar splint placed and wrapped with Kerlix and Ace bandage.  Digital blocks were performed with quarter percent plain Marcaine to aid in postoperative analgesia.  The tourniquet was deflated at 69 minutes.  Fingertips were pink with brisk capillary refill after deflation of tourniquet.  The operative  drapes were broken down.  The patient was awoken from anesthesia safely.  He was transferred back to the stretcher and taken to PACU in stable condition.  I will see him back in the office in 1 week for postoperative followup.  I will give him a prescription for Norco 5/325 1-2 tabs PO q6 hours prn pain, dispense # 30 and Bactrim DS 1 p.o. twice daily x7 days.   Leanora Cover, MD Electronically signed, 10/13/18

## 2018-10-13 NOTE — ED Notes (Signed)
Soaking pt's hand in saline, betadine and peroxide

## 2018-10-13 NOTE — ED Notes (Signed)
Wrapped pt's hand and obtained consent for surgery

## 2018-10-13 NOTE — ED Notes (Signed)
Pt transported to xray 

## 2018-10-13 NOTE — Anesthesia Postprocedure Evaluation (Signed)
Anesthesia Post Note  Patient: Jonathan Sharp  Procedure(s) Performed: IRRIGATION AND DEBRIDEMENT RIGHT HAND (Right )     Patient location during evaluation: PACU Anesthesia Type: General Level of consciousness: awake and alert Pain management: pain level controlled Vital Signs Assessment: post-procedure vital signs reviewed and stable Respiratory status: spontaneous breathing, nonlabored ventilation and respiratory function stable Cardiovascular status: blood pressure returned to baseline and stable Postop Assessment: no apparent nausea or vomiting Anesthetic complications: no    Last Vitals:  Vitals:   10/13/18 2045 10/13/18 2100  BP: (!) 142/87 (!) 145/88  Pulse: 60 64  Resp:    Temp:  36.5 C  SpO2: 97% 97%    Last Pain:  Vitals:   10/13/18 2100  TempSrc:   PainSc: 0-No pain                 Audry Pili

## 2018-10-13 NOTE — Transfer of Care (Signed)
Immediate Anesthesia Transfer of Care Note  Patient: Jonathan Sharp  Procedure(s) Performed: IRRIGATION AND DEBRIDEMENT RIGHT HAND (Right )  Patient Location: PACU  Anesthesia Type:General  Level of Consciousness: awake  Airway & Oxygen Therapy: Patient Spontanous Breathing and Patient connected to face mask oxygen  Post-op Assessment: Report given to RN and Post -op Vital signs reviewed and stable  Post vital signs: Reviewed and stable  Last Vitals:  Vitals Value Taken Time  BP 137/74 10/13/18 2030  Temp    Pulse 62 10/13/18 2030  Resp 13 10/13/18 2030  SpO2 100 % 10/13/18 2030  Vitals shown include unvalidated device data.  Last Pain:  Vitals:   10/13/18 1555  TempSrc:   PainSc: 8          Complications: No apparent anesthesia complications

## 2018-10-13 NOTE — Anesthesia Procedure Notes (Signed)
Procedure Name: LMA Insertion Date/Time: 10/13/2018 7:06 PM Performed by: Clovis Cao, CRNA Pre-anesthesia Checklist: Patient identified, Emergency Drugs available, Suction available, Patient being monitored and Timeout performed Patient Re-evaluated:Patient Re-evaluated prior to induction Oxygen Delivery Method: Circle system utilized Preoxygenation: Pre-oxygenation with 100% oxygen Induction Type: IV induction Ventilation: Mask ventilation without difficulty LMA: LMA inserted LMA Size: 4.0 Number of attempts: 1 Placement Confirmation: breath sounds checked- equal and bilateral and positive ETCO2 Tube secured with: Tape Dental Injury: Teeth and Oropharynx as per pre-operative assessment

## 2018-10-13 NOTE — ED Triage Notes (Signed)
Pt. Stated, I sliced my 3 finger 1st, 2nd, 3rd fingers on a table saw. Went to [facility] and they sent me here cause there's not a hand specialist there. Hand and fingers are bandage.

## 2018-10-14 ENCOUNTER — Encounter (HOSPITAL_COMMUNITY): Payer: Self-pay | Admitting: Orthopedic Surgery

## 2018-10-17 DIAGNOSIS — Z86010 Personal history of colonic polyps: Secondary | ICD-10-CM | POA: Diagnosis not present

## 2018-10-17 DIAGNOSIS — K219 Gastro-esophageal reflux disease without esophagitis: Secondary | ICD-10-CM | POA: Diagnosis not present

## 2018-10-17 DIAGNOSIS — R1319 Other dysphagia: Secondary | ICD-10-CM | POA: Diagnosis not present

## 2018-10-21 ENCOUNTER — Telehealth: Payer: Self-pay | Admitting: *Deleted

## 2018-10-21 DIAGNOSIS — S62630D Displaced fracture of distal phalanx of right index finger, subsequent encounter for fracture with routine healing: Secondary | ICD-10-CM | POA: Diagnosis not present

## 2018-10-21 DIAGNOSIS — S61310D Laceration without foreign body of right index finger with damage to nail, subsequent encounter: Secondary | ICD-10-CM | POA: Diagnosis not present

## 2018-10-21 DIAGNOSIS — S62634D Displaced fracture of distal phalanx of right ring finger, subsequent encounter for fracture with routine healing: Secondary | ICD-10-CM | POA: Diagnosis not present

## 2018-10-21 DIAGNOSIS — S61312D Laceration without foreign body of right middle finger with damage to nail, subsequent encounter: Secondary | ICD-10-CM | POA: Diagnosis not present

## 2018-10-21 DIAGNOSIS — S62632D Displaced fracture of distal phalanx of right middle finger, subsequent encounter for fracture with routine healing: Secondary | ICD-10-CM | POA: Diagnosis not present

## 2018-10-21 NOTE — Telephone Encounter (Signed)
   Primary Cardiologist: Lauree Chandler, MD  Chart reviewed as part of pre-operative protocol coverage. Patient was contacted 10/21/2018 in reference to pre-operative risk assessment for pending surgery as outlined below.  BARTLEY VUOLO was last seen on 12/16/17 by Dr. Angelena Form.  Since that day, VICTORIA EUCEDA has done well. He can complete more than 4.0 METS without anginal complaints.   We generally continue ASA during the perioperative period. However, if necessary, may hold 5-7 days prior to procedure.  Therefore, based on ACC/AHA guidelines, the patient would be at acceptable risk for the planned procedure without further cardiovascular testing.   I will route this recommendation to the requesting party via Epic fax function and remove from pre-op pool.  Please call with questions.  [place], PA 10/21/2018, 3:40 PM

## 2018-10-21 NOTE — Telephone Encounter (Signed)
Left VM on both numbers. 

## 2018-10-21 NOTE — Telephone Encounter (Signed)
Follow up: ° ° °Patient returning call  ° ° ° °

## 2018-10-21 NOTE — Telephone Encounter (Signed)
   [facility] Pre-operative Risk Assessment    Request for surgical clearance:  1. What type of surgery is being performed? COLONOSCOPY/UPPER ENDOSCOPY   2. When is this surgery scheduled? 01/19/19   3. What type of clearance is required (medical clearance vs. Pharmacy clearance to hold med vs. Both)? MEDICAL  4. Are there any medications that need to be held prior to surgery and how long? ASA  5. Practice name and name of physician performing surgery? [facility]; DR. Denice Paradise, NP   6. What is your office phone number [phone]    7.   What is your office fax number [phone]  8.   Anesthesia type (None, local, MAC, general) ? NOT LISTED; PROPOFOL?   Julaine Hua 10/21/2018, 9:40 AM  _________________________________________________________________   (provider comments below)

## 2018-11-25 DIAGNOSIS — S61310D Laceration without foreign body of right index finger with damage to nail, subsequent encounter: Secondary | ICD-10-CM | POA: Diagnosis not present

## 2018-11-25 DIAGNOSIS — S62630D Displaced fracture of distal phalanx of right index finger, subsequent encounter for fracture with routine healing: Secondary | ICD-10-CM | POA: Diagnosis not present

## 2018-11-25 DIAGNOSIS — S62624D Displaced fracture of medial phalanx of right ring finger, subsequent encounter for fracture with routine healing: Secondary | ICD-10-CM | POA: Diagnosis not present

## 2018-11-25 DIAGNOSIS — S61312D Laceration without foreign body of right middle finger with damage to nail, subsequent encounter: Secondary | ICD-10-CM | POA: Diagnosis not present

## 2018-11-25 DIAGNOSIS — S62632D Displaced fracture of distal phalanx of right middle finger, subsequent encounter for fracture with routine healing: Secondary | ICD-10-CM | POA: Diagnosis not present

## 2018-12-08 ENCOUNTER — Other Ambulatory Visit: Payer: Self-pay | Admitting: Cardiovascular Disease

## 2018-12-08 DIAGNOSIS — I2510 Atherosclerotic heart disease of native coronary artery without angina pectoris: Secondary | ICD-10-CM

## 2018-12-08 DIAGNOSIS — E7849 Other hyperlipidemia: Secondary | ICD-10-CM

## 2018-12-08 DIAGNOSIS — I10 Essential (primary) hypertension: Secondary | ICD-10-CM

## 2018-12-18 ENCOUNTER — Other Ambulatory Visit: Payer: Self-pay | Admitting: Cardiovascular Disease

## 2018-12-18 ENCOUNTER — Other Ambulatory Visit: Payer: Self-pay

## 2018-12-18 ENCOUNTER — Ambulatory Visit: Payer: Medicare Other | Admitting: Cardiovascular Disease

## 2018-12-18 ENCOUNTER — Encounter: Payer: Self-pay | Admitting: Cardiovascular Disease

## 2018-12-18 ENCOUNTER — Encounter (INDEPENDENT_AMBULATORY_CARE_PROVIDER_SITE_OTHER): Payer: Self-pay

## 2018-12-18 VITALS — BP 116/76 | HR 55 | Ht 73.0 in | Wt 226.0 lb

## 2018-12-18 DIAGNOSIS — E7800 Pure hypercholesterolemia, unspecified: Secondary | ICD-10-CM

## 2018-12-18 DIAGNOSIS — I2510 Atherosclerotic heart disease of native coronary artery without angina pectoris: Secondary | ICD-10-CM | POA: Diagnosis not present

## 2018-12-18 DIAGNOSIS — I10 Essential (primary) hypertension: Secondary | ICD-10-CM

## 2018-12-18 NOTE — Progress Notes (Signed)
Chief Complaint  Patient presents with  . Follow-up    CAD    History of Present Illness: 68 yo male with h/o CAD and hyperlipidemia here today for follow up. In April 2010 he had a non-ST elevation MI treated with drug eluting stent placement in the LAD. His ejection fraction was initially 40% but this improved to 60%. Stress myoview 09/01/12 in setting of admission for chest pain with LVEF=55%, no ischemia. He had LE edema with Norvasc. He did not tolerate high dose statin therapy. Zocor lowered to 20 mg per day due to muscle aches. He has tolerated the 20 mg daily dose. Echo October 2019 with LVEF=55-60%. Grade 1 diastolic dysfunction. No significant valve disease.   He is here today for follow up. The patient denies any chest pain, dyspnea, palpitations, lower extremity edema, orthopnea, PND, dizziness, near syncope or syncope. He has been very active. 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   Primary Care Physician: Birdie Sons, MD  Past Medical History:  Diagnosis Date  . Coronary artery disease    a. s/p NSTEMI with DES to LAD 06/2008.  Marland Kitchen HTN (hypertension)   . Hyperlipidemia   . Kidney stones   . LV dysfunction    a. EF 40% at time of cath 06/2008, improved to normal on f/u echo.    Past Surgical History:  Procedure Laterality Date  . CAROTID STENT  2010  . CATARACT EXTRACTION, BILATERAL    . EYE SURGERY    . I&D EXTREMITY Right 10/13/2018   Procedure: IRRIGATION AND DEBRIDEMENT RIGHT HAND;  Surgeon: Leanora Cover, MD;  Location: [place];  Service: Orthopedics;  Laterality: Right;  . INTRAOCULAR LENS INSERTION Right 10/22/2016   [facility]    Current Outpatient Medications  Medication Sig Dispense Refill  . aspirin 81 MG tablet Take 1 tablet (81 mg total) by mouth daily. 30 tablet 6  . carvedilol (COREG) 6.25 MG tablet TAKE 1 TABLET BY MOUTH TWICE A DAY WITH A MEAL 180 tablet 2  . famotidine (PEPCID) 20 MG tablet TAKE 1 TABLET BY MOUTH 2 (TWO) TIMES DAILY. 180 tablet 2   . losartan (COZAAR) 50 MG tablet TAKE 1 TABLET BY MOUTH EVERY DAY 90 tablet 1  . nitroGLYCERIN (NITROSTAT) 0.4 MG SL tablet PLACE 1 TABLET UNDER THE TONGUE EVERY 5 MINUTES AS NEEDED FOR CHEST PAIN,* MAY REPEAT UP TO 3 TIMES 75 tablet 1  . simvastatin (ZOCOR) 20 MG tablet Take 20 mg by mouth daily.    Marland Kitchen sulfamethoxazole-trimethoprim (BACTRIM DS) 800-160 MG tablet Take 1 tablet by mouth 2 (two) times daily. 14 tablet 0   No current facility-administered medications for this visit.     No Known Allergies  Social History   Socioeconomic History  . Marital status: Married    Spouse name: Not on file  . Number of children: 2  . Years of education: Not on file  . Highest education level: Some college, no degree  Occupational History  . Occupation: MAINTANCE SERVICE    Comment: retired    Comment: Owns store / Games developer  Social Needs  . Financial resource strain: Not hard at all  . Food insecurity    Worry: Never true    Inability: Never true  . Transportation needs    Medical: No    Non-medical: No  Tobacco Use  . Smoking status: Never Smoker  . Smokeless tobacco: Never Used  Substance and Sexual Activity  . Alcohol use: Yes  Alcohol/week: 1.0 - 2.0 standard drinks    Types: 1 - 2 Shots of liquor per week  . Drug use: No  . Sexual activity: Not on file  Lifestyle  . Physical activity    Days per week: 0 days    Minutes per session: 0 min  . Stress: Not at all  Relationships  . Social Herbalist on phone: Patient refused    Gets together: Patient refused    Attends religious service: Patient refused    Active member of club or organization: Patient refused    Attends meetings of clubs or organizations: Patient refused    Relationship status: Patient refused  . Intimate partner violence    Fear of current or ex partner: Patient refused    Emotionally abused: Patient refused    Physically abused: Patient refused    Forced sexual activity: Patient refused   Other Topics Concern  . 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 Heart attack Father 49  . Diabetes Brother        over weight    Review of Systems:  As stated in the HPI and otherwise negative.   BP 116/76   Pulse (!) 55   Ht 6\' 1"  (1.854 m)   Wt 226 lb (102.5 kg)   BMI 29.82 kg/m   Physical Examination: General: Well developed, well nourished, NAD  HEENT: OP clear, mucus membranes moist  SKIN: warm, dry. No rashes. Neuro: No focal deficits  Musculoskeletal: Muscle strength 5/5 all ext  Psychiatric: Mood and affect normal  Neck: No JVD, no carotid bruits, no thyromegaly, no lymphadenopathy.  Lungs:Clear bilaterally, no wheezes, rhonci, crackles Cardiovascular: Regular rate and rhythm. No murmurs, gallops or rubs. Abdomen:Soft. Bowel sounds present. Non-tender.  Extremities: No lower extremity edema. Pulses are 2 + in the bilateral DP/PT.  EKG:  EKG is  ordered today. The ekg ordered today demonstrates  Sinus brady, rate 55 bpm.   Recent Labs: 03/19/2018: ALT 50 10/13/2018: BUN 14; Creatinine, Ser 1.36; Hemoglobin 14.7; Platelets 170; Potassium 4.5; Sodium 140   Lipid Panel    Component Value Date/Time   CHOL 157 03/19/2018 1001   TRIG 103 03/19/2018 1001   HDL 51 03/19/2018 1001   CHOLHDL 3.1 03/19/2018 1001   CHOLHDL 2.3 01/25/2017 0801   VLDL 29 12/14/2015 0827   LDLCALC 85 03/19/2018 1001   LDLCALC 50 01/25/2017 0801     Wt Readings from Last 3 Encounters:  12/18/18 226 lb (102.5 kg)  10/13/18 225 lb (102.1 kg)  03/19/18 239 lb 6.4 oz (108.6 kg)     Other studies Reviewed: Additional studies/ records that were reviewed today include: . Review of the above records demonstrates:    Assessment and Plan:   1. CAD without angina: He has no chest pain. Will  continue ASA, statin and beta blocker.     2. HYPERLIPIDEMIA: Lipids followed in primary care. Will continue statin.    3. HTN: BP is controlled.    Current medicines are reviewed at length with the patient today.  The patient does not have concerns regarding medicines.  The following changes have been made:  no change  Labs/ tests ordered today include:   Orders Placed This Encounter  Procedures  . EKG 12-Lead     Disposition:   FU with me in 12  months   Signed, Lauree Chandler, MD 12/18/2018 8:44 AM    [facility] [address], [place], [place]  [address] Phone: [phone]; Fax: [phone]

## 2018-12-18 NOTE — Patient Instructions (Signed)
Medication Instructions:  No changes If you need a refill on your cardiac medications before your next appointment, please call your pharmacy.   Lab work: none If you have labs (blood work) drawn today and your tests are completely normal, you will receive your results only by: . MyChart Message (if you have MyChart) OR . A paper copy in the mail If you have any lab test that is abnormal or we need to change your treatment, we will call you to review the results.  Testing/Procedures: none  Follow-Up: At [facility], you and your health needs are our priority.  As part of our continuing mission to provide you with exceptional heart care, we have created designated Provider Care Teams.  These Care Teams include your primary Cardiologist (physician) and Advanced Practice Providers (APPs -  Physician Assistants and Nurse Practitioners) who all work together to provide you with the care you need, when you need it. You will need a follow up appointment in 12 months.  Please call our office 2 months in advance to schedule this appointment.  You may see Christopher McAlhany, MD or one of the following Advanced Practice Providers on your designated Care Team:   Brittainy Simmons, PA-C Dayna Dunn, PA-C . Michele Lenze, PA-C  Any Other Special Instructions Will Be Listed Below (If Applicable).    

## 2019-01-06 DIAGNOSIS — S61312D Laceration without foreign body of right middle finger with damage to nail, subsequent encounter: Secondary | ICD-10-CM | POA: Diagnosis not present

## 2019-01-06 DIAGNOSIS — S62630D Displaced fracture of distal phalanx of right index finger, subsequent encounter for fracture with routine healing: Secondary | ICD-10-CM | POA: Diagnosis not present

## 2019-01-06 DIAGNOSIS — S62624D Displaced fracture of medial phalanx of right ring finger, subsequent encounter for fracture with routine healing: Secondary | ICD-10-CM | POA: Diagnosis not present

## 2019-01-06 DIAGNOSIS — S62632D Displaced fracture of distal phalanx of right middle finger, subsequent encounter for fracture with routine healing: Secondary | ICD-10-CM | POA: Diagnosis not present

## 2019-01-06 DIAGNOSIS — S61310D Laceration without foreign body of right index finger with damage to nail, subsequent encounter: Secondary | ICD-10-CM | POA: Diagnosis not present

## 2019-01-18 ENCOUNTER — Other Ambulatory Visit: Payer: Self-pay | Admitting: Cardiovascular Disease

## 2019-01-18 DIAGNOSIS — I2510 Atherosclerotic heart disease of native coronary artery without angina pectoris: Secondary | ICD-10-CM

## 2019-01-18 DIAGNOSIS — I10 Essential (primary) hypertension: Secondary | ICD-10-CM

## 2019-01-19 ENCOUNTER — Ambulatory Visit: Admit: 2019-01-19 | Payer: Medicare Other | Admitting: Internal Medicine

## 2019-01-19 SURGERY — COLONOSCOPY WITH PROPOFOL
Anesthesia: General

## 2019-01-20 ENCOUNTER — Other Ambulatory Visit: Payer: Self-pay | Admitting: Cardiovascular Disease

## 2019-02-02 NOTE — Progress Notes (Signed)
Subjective:   Jonathan Sharp is a 68 y.o. male who presents for Medicare Annual/Subsequent preventive examination.    This visit is being conducted through telemedicine due to the COVID-19 pandemic. This patient has given me verbal consent via doximity to conduct this visit, patient states they are participating from their home address. Some vital signs may be absent or patient reported.    Patient identification: identified by name, DOB, and current address  Review of Systems:  N/A  Cardiac Risk Factors include: advanced age (>43men, >22 women);dyslipidemia;hypertension;male gender     Objective:    Vitals: There were no vitals taken for this visit.  There is no height or weight on file to calculate BMI. Unable to obtain vitals due to visit being conducted via telephonically.   Advanced Directives 02/03/2019 01/27/2018 01/24/2017 02/01/2016 09/23/2014 08/31/2012  Does Patient Have a Medical Advance Directive? Yes Yes Yes Yes No Patient would not like information;Patient does not have advance directive  Type of Advance Directive [place];Living will [place];Living will [place];Living will [place];Living will - -  Copy of [place] in Chart? No - copy requested No - copy requested No - copy requested No - copy requested - -  Would patient like information on creating a medical advance directive? - - - - No - patient declined information -  Pre-existing out of facility DNR order (yellow form or pink MOST form) - - - - - No    Tobacco Social History   Tobacco Use  Smoking Status Never Smoker  Smokeless Tobacco Never Used     Counseling given: Not Answered   Clinical Intake:  Pre-visit preparation completed: Yes  Pain : No/denies pain Pain Score: 0-No pain     Nutritional Risks: None Diabetes: No  How often do you need to have someone help you when you read instructions,  pamphlets, or other written materials from your doctor or pharmacy?: 1 - Never  Interpreter Needed?: No  Information entered by :: [facility], LPN  Past Medical History:  Diagnosis Date  . Coronary artery disease    a. s/p NSTEMI with DES to LAD 06/2008.  Marland Kitchen HTN (hypertension)   . Hyperlipidemia   . Kidney stones   . LV dysfunction    a. EF 40% at time of cath 06/2008, improved to normal on f/u echo.   Past Surgical History:  Procedure Laterality Date  . CAROTID STENT  2010  . CATARACT EXTRACTION, BILATERAL    . EYE SURGERY    . I&D EXTREMITY Right 10/13/2018   Procedure: IRRIGATION AND DEBRIDEMENT RIGHT HAND;  Surgeon: Leanora Cover, MD;  Location: [facility];  Service: Orthopedics;  Laterality: Right;  . INTRAOCULAR LENS INSERTION Right 10/22/2016   [facility]   Family History  Problem Relation Age of Onset  . Heart attack Father 76  . Diabetes Brother        over weight   Social History   Socioeconomic History  . Marital status: Married    Spouse name: Not on file  . Number of children: 2  . Years of education: Not on file  . Highest education level: Some college, no degree  Occupational History  . Occupation: MAINTANCE SERVICE    Comment: retired    Comment: Owns store / Games developer  Social Needs  . Financial resource strain: Not hard at all  . Food insecurity    Worry: Never true  Inability: Never true  . Transportation needs    Medical: No    Non-medical: No  Tobacco Use  . Smoking status: Never Smoker  . Smokeless tobacco: Never Used  Substance and Sexual Activity  . Alcohol use: Yes    Alcohol/week: 1.0 - 2.0 standard drinks    Types: 1 - 2 Shots of liquor per week  . Drug use: No  . Sexual activity: Not on file  Lifestyle  . Physical activity    Days per week: 3 days    Minutes per session: 60 min  . Stress: Not at all  Relationships  . Social Herbalist on phone: Patient refused    Gets together: Patient refused    Attends religious service:  Patient refused    Active member of club or organization: Patient refused    Attends meetings of clubs or organizations: Patient refused    Relationship status: Patient refused  Other Topics Concern  . 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 aspirin 81 MG tablet Take 1 tablet (81 mg total) by mouth daily.  . carvedilol (COREG) 6.25 MG tablet TAKE 1 TABLET BY MOUTH TWICE A DAY WITH MEALS  . famotidine (PEPCID) 20 MG tablet TAKE 1 TABLET BY MOUTH TWICE A DAY  . losartan (COZAAR) 50 MG tablet Take 1 tablet (50 mg total) by mouth daily.  . nitroGLYCERIN (NITROSTAT) 0.4 MG SL tablet PLACE 1 TABLET UNDER THE TONGUE EVERY 5 MINUTES AS NEEDED FOR CHEST PAIN,* MAY REPEAT UP TO 3 TIMES  . simvastatin (ZOCOR) 20 MG tablet Take 20 mg by mouth daily.  Marland Kitchen sulfamethoxazole-trimethoprim (BACTRIM DS) 800-160 MG tablet Take 1 tablet by mouth 2 (two) times daily. (Patient not taking: Reported on 02/03/2019)   No facility-administered encounter medications on file as of 02/03/2019.     Activities of Daily Living In your present state of health, do you have any difficulty performing the following activities: 02/03/2019 03/19/2018  Hearing? Tempie Donning  Comment Wears bilateral hearing aids. -  Vision? N N  Difficulty concentrating or making decisions? N N  Walking or climbing stairs? N N  Dressing or bathing? N N  Doing errands, shopping? N N  Preparing Food and eating ? N -  Using the Toilet? N -  In the past six months, have you accidently leaked urine? N -  Do you have problems with loss of bowel control? N -  Managing your Medications? N -  Managing your Finances? N -  Housekeeping or managing your Housekeeping? N -  Some recent data might be hidden    Patient Care Team: Birdie Sons,  MD as PCP - General (Family Medicine) Burnell Blanks, MD as PCP - Cardiology (Cardiology) Emmaline Kluver., MD as Consulting Physician (Rheumatology) Daryll Brod, MD as Consulting Physician (Orthopedic Surgery)   Assessment:   This is a routine wellness examination for Jonathan Sharp.  Exercise Activities and Dietary recommendations Current Exercise Habits: Home exercise routine, Type of exercise: walking, Time (Minutes): 60, Frequency (Times/Week): 3, Weekly Exercise (Minutes/Week): 180, Intensity: Moderate, Exercise limited by: None identified  Goals    . Increase water intake     Starting 02/01/16, I will increase my water intake to  4 glasses a day.    . Weight (lb) < 215 lb (97.5 kg)     Recommend to continue exercising and increase to 3 days a week for at least 30 minutes.       Fall Risk: Fall Risk  02/03/2019 03/19/2018 01/27/2018 01/24/2017 02/01/2016  Falls in the past year? 0 0 0 No No  Number falls in past yr: 0 - - - -  Injury with Fall? 0 - - - -    FALL RISK PREVENTION PERTAINING TO THE HOME:  Any stairs in or around the home? Yes  If so, are there any without handrails? No   Home free of loose throw rugs in walkways, pet beds, electrical cords, etc? Yes  Adequate lighting in your home to reduce risk of falls? Yes   ASSISTIVE DEVICES UTILIZED TO PREVENT FALLS:  Life alert? No  Use of a cane, walker or w/c? No  Grab bars in the bathroom? No  Shower chair or bench in shower? No  Elevated toilet seat or a handicapped toilet? Yes   TIMED UP AND GO:  Was the test performed? No .    Depression Screen PHQ 2/9 Scores 02/03/2019 03/19/2018 01/27/2018 01/24/2017  PHQ - 2 Score 0 0 0 0    Cognitive Function: Declined today.      6CIT Screen 01/27/2018 02/01/2016  What Year? 0 points 0 points  What month? 0 points 0 points  What time? 0 points 0 points  Count back from 20 0 points 0 points  Months in reverse 0 points 0 points  Repeat phrase 0 points 0  points  Total Score 0 0    Immunization History  Administered Date(s) Administered  . Fluad Quad(high Dose 65+) 12/19/2018  . Influenza-Unspecified 12/20/2015, 12/21/2016, 01/06/2018  . Pneumococcal Conjugate-13 02/01/2016  . Pneumococcal Polysaccharide-23 01/24/2017  . Tdap 07/18/2007, 10/13/2018  . Zoster Recombinat (Shingrix) 05/04/2018, 12/19/2018    Qualifies for Shingles Vaccine? Completed series  Tdap: Up to date  Flu Vaccine: Up to date  Pneumococcal Vaccine: Completed series  Screening Tests Health Maintenance  Topic Date Due  . COLONOSCOPY  07/18/2018  . TETANUS/TDAP  10/12/2028  . INFLUENZA VACCINE  Completed  . Hepatitis C Screening  Completed  . PNA vac Low Risk Adult  Completed   Cancer Screenings:  Colorectal Screening: Completed 07/17/13. Repeat every 5 years. Colonoscopy scheduled for 03/2019.  Lung Cancer Screening: (Low Dose CT Chest recommended if Age 42-80 years, 30 pack-year currently smoking OR have quit w/in 15years.) does not qualify.   Additional Screening:  Hepatitis C Screening: Up to date  Vision Screening: Recommended annual ophthalmology exams for early detection of glaucoma and other disorders of the eye.  Dental Screening: Recommended annual dental exams for proper oral hygiene  Community Resource Referral:  CRR required this visit?  No        Plan:  I have personally reviewed and addressed the Medicare Annual Wellness questionnaire and have noted the following in the patient's chart:  A. Medical and social history B. Use of alcohol, tobacco or illicit drugs  C. Current medications and supplements D. Functional ability and status E.  Nutritional status F.  Physical activity G. Advance directives H. List of other physicians I.  Hospitalizations, surgeries, and ER visits in previous 12 months J.  [place] such as hearing and vision if needed, cognitive and depression L. Referrals and appointments   In addition, I  have reviewed and discussed with patient certain  preventive protocols, quality metrics, and best practice recommendations. A written personalized care plan for preventive services as well as general preventive health recommendations were provided to patient.   Glendora Score, LPN  [id number] Nurse Health Advisor   Nurse Notes: None. Colonoscopy is scheduled for 03/2019.

## 2019-02-03 ENCOUNTER — Other Ambulatory Visit: Payer: Self-pay

## 2019-02-03 ENCOUNTER — Ambulatory Visit (INDEPENDENT_AMBULATORY_CARE_PROVIDER_SITE_OTHER): Payer: Medicare Other

## 2019-02-03 DIAGNOSIS — Z0000 Encounter for general adult medical examination without abnormal findings: Secondary | ICD-10-CM | POA: Diagnosis not present

## 2019-02-03 NOTE — Patient Instructions (Signed)
Jonathan Sharp , Thank you for taking time to come for your Medicare Wellness Visit. I appreciate your ongoing commitment to your health goals. Please review the following plan we discussed and let me know if I can assist you in the future.   Screening recommendations/referrals: Colonoscopy: Colonoscopy scheduled for 03/2019 Recommended yearly ophthalmology/optometry visit for glaucoma screening and checkup Recommended yearly dental visit for hygiene and checkup  Vaccinations: Influenza vaccine: Up to date Pneumococcal vaccine: Completed series Tdap vaccine: Up to date, due 10/2028 Shingles vaccine: Completed series    Advanced directives: Please bring a copy of your POA (Power of [place]) and/or Living Will to your next appointment.   Conditions/risks identified: Continue to walk 3 days a week and continue to increase water intake to help aid in weight loss.   Next appointment: 04/08/19 @ 10:00 AM with Dr Caryn Section. Declined scheduling an AWV for 2021 at this time.   Preventive Care 70 Years and Older, Male Preventive care refers to lifestyle choices and visits with your health care provider that can promote health and wellness. What does preventive care include?  A yearly physical exam. This is also called an annual well check.  Dental exams once or twice a year.  Routine eye exams. Ask your health care provider how often you should have your eyes checked.  Personal lifestyle choices, including:  Daily care of your teeth and gums.  Regular physical activity.  Eating a healthy diet.  Avoiding tobacco and drug use.  Limiting alcohol use.  Practicing safe sex.  Taking low doses of aspirin every day.  Taking vitamin and mineral supplements as recommended by your health care provider. What happens during an annual well check? The services and screenings done by your health care provider during your annual well check will depend on your age, overall health, lifestyle risk factors,  and family history of disease. Counseling  Your health care provider may ask you questions about your:  Alcohol use.  Tobacco use.  Drug use.  Emotional well-being.  Home and relationship well-being.  Sexual activity.  Eating habits.  History of falls.  Memory and ability to understand (cognition).  Work and work Statistician. Screening  You may have the following tests or measurements:  Height, weight, and BMI.  Blood pressure.  Lipid and cholesterol levels. These may be checked every 5 years, or more frequently if you are over 82 years old.  Skin check.  Lung cancer screening. You may have this screening every year starting at age 64 if you have a 30-pack-year history of smoking and currently smoke or have quit within the past 15 years.  Fecal occult blood test (FOBT) of the stool. You may have this test every year starting at age 25.  Flexible sigmoidoscopy or colonoscopy. You may have a sigmoidoscopy every 5 years or a colonoscopy every 10 years starting at age 34.  Prostate cancer screening. Recommendations will vary depending on your family history and other risks.  Hepatitis C blood test.  Hepatitis B blood test.  Sexually transmitted disease (STD) testing.  Diabetes screening. This is done by checking your blood sugar (glucose) after you have not eaten for a while (fasting). You may have this done every 1-3 years.  Abdominal aortic aneurysm (AAA) screening. You may need this if you are a current or former smoker.  Osteoporosis. You may be screened starting at age 6 if you are at high risk. Talk with your health care provider about your test results, treatment options, and  if necessary, the need for more tests. Vaccines  Your health care provider may recommend certain vaccines, such as:  Influenza vaccine. This is recommended every year.  Tetanus, diphtheria, and acellular pertussis (Tdap, Td) vaccine. You may need a Td booster every 10 years.   Zoster vaccine. You may need this after age 53.  Pneumococcal 13-valent conjugate (PCV13) vaccine. One dose is recommended after age 33.  Pneumococcal polysaccharide (PPSV23) vaccine. One dose is recommended after age 30. Talk to your health care provider about which screenings and vaccines you need and how often you need them. This information is not intended to replace advice given to you by your health care provider. Make sure you discuss any questions you have with your health care provider. Document Released: 03/25/2015 Document Revised: 11/16/2015 Document Reviewed: 12/28/2014 Elsevier Interactive Patient Education  2017 [place] Prevention in the Home Falls can cause injuries. They can happen to people of all ages. There are many things you can do to make your home safe and to help prevent falls. What can I do on the outside of my home?  Regularly fix the edges of walkways and driveways and fix any cracks.  Remove anything that might make you trip as you walk through a door, such as a raised step or threshold.  Trim any bushes or trees on the path to your home.  Use bright outdoor lighting.  Clear any walking paths of anything that might make someone trip, such as rocks or tools.  Regularly check to see if handrails are loose or broken. Make sure that both sides of any steps have handrails.  Any raised decks and porches should have guardrails on the edges.  Have any leaves, snow, or ice cleared regularly.  Use sand or salt on walking paths during winter.  Clean up any spills in your garage right away. This includes oil or grease spills. What can I do in the bathroom?  Use night lights.  Install grab bars by the toilet and in the tub and shower. Do not use towel bars as grab bars.  Use non-skid mats or decals in the tub or shower.  If you need to sit down in the shower, use a plastic, non-slip stool.  Keep the floor dry. Clean up any water that spills on  the floor as soon as it happens.  Remove soap buildup in the tub or shower regularly.  Attach bath mats securely with double-sided non-slip rug tape.  Do not have throw rugs and other things on the floor that can make you trip. What can I do in the bedroom?  Use night lights.  Make sure that you have a light by your bed that is easy to reach.  Do not use any sheets or blankets that are too big for your bed. They should not hang down onto the floor.  Have a firm chair that has side arms. You can use this for support while you get dressed.  Do not have throw rugs and other things on the floor that can make you trip. What can I do in the kitchen?  Clean up any spills right away.  Avoid walking on wet floors.  Keep items that you use a lot in easy-to-reach places.  If you need to reach something above you, use a strong step stool that has a grab bar.  Keep electrical cords out of the way.  Do not use floor polish or wax that makes floors slippery. If you  must use wax, use non-skid floor wax.  Do not have throw rugs and other things on the floor that can make you trip. What can I do with my stairs?  Do not leave any items on the stairs.  Make sure that there are handrails on both sides of the stairs and use them. Fix handrails that are broken or loose. Make sure that handrails are as long as the stairways.  Check any carpeting to make sure that it is firmly attached to the stairs. Fix any carpet that is loose or worn.  Avoid having throw rugs at the top or bottom of the stairs. If you do have throw rugs, attach them to the floor with carpet tape.  Make sure that you have a light switch at the top of the stairs and the bottom of the stairs. If you do not have them, ask someone to add them for you. What else can I do to help prevent falls?  Wear shoes that:  Do not have high heels.  Have rubber bottoms.  Are comfortable and fit you well.  Are closed at the toe. Do not  wear sandals.  If you use a stepladder:  Make sure that it is fully opened. Do not climb a closed stepladder.  Make sure that both sides of the stepladder are locked into place.  Ask someone to hold it for you, if possible.  Clearly mark and make sure that you can see:  Any grab bars or handrails.  First and last steps.  Where the edge of each step is.  Use tools that help you move around (mobility aids) if they are needed. These include:  Canes.  Walkers.  Scooters.  Crutches.  Turn on the lights when you go into a dark area. Replace any light bulbs as soon as they burn out.  Set up your furniture so you have a clear path. Avoid moving your furniture around.  If any of your floors are uneven, fix them.  If there are any pets around you, be aware of where they are.  Review your medicines with your doctor. Some medicines can make you feel dizzy. This can increase your chance of falling. Ask your doctor what other things that you can do to help prevent falls. This information is not intended to replace advice given to you by your health care provider. Make sure you discuss any questions you have with your health care provider. Document Released: 12/23/2008 Document Revised: 08/04/2015 Document Reviewed: 04/02/2014 Elsevier Interactive Patient Education  2017 [employer].

## 2019-03-02 ENCOUNTER — Other Ambulatory Visit: Payer: Self-pay | Admitting: Cardiovascular Disease

## 2019-03-02 DIAGNOSIS — I10 Essential (primary) hypertension: Secondary | ICD-10-CM

## 2019-03-02 DIAGNOSIS — I2510 Atherosclerotic heart disease of native coronary artery without angina pectoris: Secondary | ICD-10-CM

## 2019-03-16 ENCOUNTER — Telehealth: Payer: Self-pay | Admitting: Cardiovascular Disease

## 2019-03-16 MED ORDER — LOSARTAN POTASSIUM 50 MG PO TABS: 50.0000 mg | ORAL_TABLET | Freq: Two times a day (BID) | ORAL | 3 refills | Status: DC

## 2019-03-16 NOTE — Telephone Encounter (Signed)
Medication list has pt taking losartan 50 mg daily. This was dose listed at last office visit with Dr Angelena Form on 12/18/18. It was previously increased to twice daily at office visit in October 2019.  Pt reports at office visit on 12/18/18 he was actually taking twice daily. Note from 12/18/18 indicates BP was controlled. I told pt he should continue losartan as he has been taking--50 mg twice daily. Will send new prescription to [facility] in [place].

## 2019-03-16 NOTE — Telephone Encounter (Signed)
New Message     Pt c/o medication issue:  1. Name of Medication: losartan (COZAAR) 50 MG tablet  2. How are you currently taking this medication (dosage and times per day)? 2x daily, he has been taking it for over a year like this   3. Are you having a reaction (difficulty breathing--STAT)? No   4. What is your medication issue? Pt is calling and says  his medication got Refilled with 90 pills. Pharmacy told him it was changed to 1 x daily but he used to get  180 pills because he takes it 2 x daily    Please call

## 2019-03-16 NOTE — Telephone Encounter (Signed)
Thanks Pat

## 2019-04-06 ENCOUNTER — Ambulatory Visit: Admit: 2019-04-06 | Payer: Medicare Other | Admitting: Internal Medicine

## 2019-04-06 SURGERY — ESOPHAGOGASTRODUODENOSCOPY (EGD) WITH PROPOFOL
Anesthesia: General

## 2019-04-08 ENCOUNTER — Encounter: Payer: Medicare Other | Admitting: Family Medicine

## 2019-06-26 NOTE — Progress Notes (Signed)
Annual Physical    Patient: Jonathan Sharp, Male    DOB: [birth year], 70 y.o.   MRN: [id number] Visit Date: 06/29/2019  Today's Provider: Lelon Huh, MD  Subjective:     Mertie Moores as a scribe for Lelon Huh, MD.,have documented all relevant documentation on the behalf of Lelon Huh, MD,as directed by  Lelon Huh, MD while in the presence of Lelon Huh, MD.  Chief Complaint  Patient presents with  . Annual Exam   Jonathan Sharp is a 69 y.o. male who presents today for his annual physical. He reports consuming a general diet. Home exercise routine includes walking 3 hrs per week. He generally feels fairly well. He reports sleeping fairly well. He does have additional problems to discuss today. Patient is experiencing nocturia. He has to get up 3-4 times every night with hesitancy and weak stream. He does not have to go frequently during the daytime, but when he does, the stream is weak.   HPI Hypertension, follow-up  BP Readings from Last 3 Encounters:  06/29/19 (!) 157/84  12/18/18 116/76  10/13/18 (!) 145/88   Wt Readings from Last 3 Encounters:  06/29/19 231 lb 9.6 oz (105.1 kg)  12/18/18 226 lb (102.5 kg)  10/13/18 225 lb (102.1 kg)   He was last seen for hypertension 1 years ago.  BP at that visit was 146/88. Management since that visit includes no change. He reports good compliance with treatment. He is not having side effects.  He is exercising. He is adherent to low salt diet.   Outside blood pressures are being checked at home 3 times per week. and are consistently in the 110s to 130s.  Symptoms: No chest pain No chest pressure/discomfort No dyspnea (difficulty breathing) No lower extremity edema No orthopnea  No palpitations No paroxysmal nocturnal dyspnea  No syncope  He does not smoke. He is following a Regular diet. Use of agents associated with hypertension: none.     The ASCVD Risk score Mikey Bussing DC Jr., et al., 2013) failed  to calculate for the following reasons:   The patient has a prior MI or stroke diagnosis   ----------------------------------------------------------------------------------------- Lipid/Cholesterol, Follow-up  Last Lipid Panel: Lab Results  Component Value Date   CHOL 157 03/19/2018   LDLCALC 85 03/19/2018   HDL 51 03/19/2018   TRIG 103 03/19/2018   ALT 50 (H) 03/19/2018   AST 32 03/19/2018   PLT 170 10/13/2018    He was last seen for this more than 1 years ago.  Management since that visit includes no change.  He reports good compliance with treatment. He is not having side effects.  Symptoms: No chest pain No chest pressure/discomfort No dyspnea No lower extremity edema No numbness or tingling of extremity No orthopnea No palpitations No paroxysmal nocturnal dyspnea No speech difficulty No syncope  Current diet: in general, a "healthy" diet   Current exercise: walking  Wt Readings from Last 3 Encounters:  06/29/19 231 lb 9.6 oz (105.1 kg)  12/18/18 226 lb (102.5 kg)  10/13/18 225 lb (102.1 kg)   The ASCVD Risk score Mikey Bussing DC Jr., et al., 2013) failed to calculate for the following reasons:   The patient has a prior MI or stroke diagnosis  -----------------------------------------------------------------------------------------    Medications: Outpatient Medications Prior to Visit  Medication Sig  . aspirin 81 MG tablet Take 1 tablet (81 mg total) by mouth daily.  . carvedilol (COREG) 6.25 MG tablet TAKE 1 TABLET BY MOUTH TWICE A  DAY WITH MEALS  . famotidine (PEPCID) 20 MG tablet TAKE 1 TABLET BY MOUTH TWICE A DAY  . losartan (COZAAR) 50 MG tablet Take 1 tablet (50 mg total) by mouth 2 (two) times daily.  . nitroGLYCERIN (NITROSTAT) 0.4 MG SL tablet PLACE 1 TABLET UNDER THE TONGUE EVERY 5 MINUTES AS NEEDED FOR CHEST PAIN,* MAY REPEAT UP TO 3 TIMES  . simvastatin (ZOCOR) 20 MG tablet TAKE 1 TABLET BY MOUTH EVERY DAY. NEED APPT FOR FUTURE REFILLS  .  [DISCONTINUED] sulfamethoxazole-trimethoprim (BACTRIM DS) 800-160 MG tablet Take 1 tablet by mouth 2 (two) times daily. (Patient not taking: Reported on 02/03/2019)   No facility-administered medications prior to visit.    No Known Allergies  Patient Care Team: Birdie Sons, MD as PCP - General (Family Medicine) Burnell Blanks, MD as PCP - Cardiology (Cardiology) Emmaline Kluver., MD as Consulting Physician (Rheumatology) Daryll Brod, MD as Consulting Physician (Orthopedic Surgery)  Review of Systems  Constitutional: Negative.   HENT: Negative.   Eyes: Negative.   Respiratory: Negative.   Cardiovascular: Negative.   Gastrointestinal: Negative.   Endocrine: Negative.   Genitourinary: Positive for decreased urine volume.       Nocturia   Musculoskeletal: Negative.   Skin: Negative.   Allergic/Immunologic: Negative.   Neurological: Negative.   Hematological: Negative.   Psychiatric/Behavioral: Negative.        Objective:    Vitals: BP (!) 157/84 (BP Location: Right Arm, Patient Position: Sitting, Cuff Size: Large)   Pulse (!) 55   Temp (!) 96.8 F (36 C) (Temporal)   Ht 6\' 1"  (1.854 m)   Wt 231 lb 9.6 oz (105.1 kg)   BMI 30.56 kg/m    Physical Exam   General Appearance:    Mildly obese male. Alert, cooperative, in no acute distress, appears stated age  Head:    Normocephalic, without obvious abnormality, atraumatic  Eyes:    PERRL, conjunctiva/corneas clear, EOM's intact, fundi    benign, both eyes       Ears:    Normal TM's and external ear canals, both ears  Nose:   Nares normal, septum midline, mucosa normal, no drainage   or sinus tenderness  Throat:   Lips, mucosa, and tongue normal; teeth and gums normal  Neck:   Supple, symmetrical, trachea midline, no adenopathy;       thyroid:  No enlargement/tenderness/nodules; no carotid   bruit or JVD  Back:     Symmetric, no curvature, ROM normal, no CVA tenderness  Lungs:     Clear to  auscultation bilaterally, respirations unlabored  Chest wall:    No tenderness or deformity  Heart:    Bradycardic. Normal rhythm. No murmurs, rubs, or gallops.  S1 and S2 normal  Abdomen:     Soft, non-tender, bowel sounds active all four quadrants,    no masses, no organomegaly  Genitalia:    deferred  Rectal:    deferred  Extremities:   All extremities are intact. No cyanosis or edema  Pulses:   2+ and symmetric all extremities  Skin:   Skin color, texture, turgor normal, no rashes or lesions  Lymph nodes:   Cervical, supraclavicular, and axillary nodes normal  Neurologic:   CNII-XII intact. Normal strength, sensation and reflexes      throughout     Most recent functional status assessment: In your present state of health, do you have any difficulty performing the following activities: 06/29/2019  Hearing? Y  Comment -  Vision? N  Difficulty concentrating or making decisions? N  Walking or climbing stairs? N  Dressing or bathing? N  Doing errands, shopping? N  Preparing Food and eating ? -  Using the Toilet? -  In the past six months, have you accidently leaked urine? -  Do you have problems with loss of bowel control? -  Managing your Medications? -  Managing your Finances? -  Housekeeping or managing your Housekeeping? -  Some recent data might be hidden    Most recent fall risk assessment: Fall Risk  06/29/2019  Falls in the past year? 0  Number falls in past yr: 0  Injury with Fall? 0     Most recent depression screenings: PHQ 2/9 Scores 06/29/2019 02/03/2019  PHQ - 2 Score 0 0    Most recent cognitive screening: 6CIT Screen 01/27/2018  What Year? 0 points  What month? 0 points  What time? 0 points  Count back from 20 0 points  Months in reverse 0 points  Repeat phrase 0 points  Total Score 0    Results for orders placed or performed in visit on 06/29/19  POCT Urinalysis Dipstick  Result Value Ref Range   Color, UA yellow    Clarity, UA clear     Glucose, UA Negative Negative   Bilirubin, UA negative    Ketones, UA negative    Spec Grav, UA >=1.030 (A) 1.010 - 1.025   Blood, UA nonhemolyzed trace    pH, UA 6.0 5.0 - 8.0   Protein, UA Negative Negative   Urobilinogen, UA 0.2 0.2 or 1.0 E.U./dL   Nitrite, UA negative    Leukocytes, UA Negative Negative   Appearance     Odor        Assessment & Plan:      Annual physical done today including the all of the following: Reviewed patient's Family Medical History Reviewed and updated list of patient's medical providers Assessment of cognitive impairment was done Assessed patient's functional ability Established a written schedule for health screening [place] Completed and Reviewed  Exercise Activities and Dietary recommendations Goals    . Increase water intake     Starting 02/01/16, I will increase my water intake to 4 glasses a day.    . Weight (lb) < 215 lb (97.5 kg)     Recommend to continue exercising and increase to 3 days a week for at least 30 minutes.       Immunization History  Administered Date(s) Administered  . Fluad Quad(high Dose 65+) 12/19/2018  . Influenza-Unspecified 12/20/2015, 12/21/2016, 01/06/2018  . Moderna SARS-COVID-2 Vaccination 04/24/2019, 05/20/2019  . Pneumococcal Conjugate-13 02/01/2016  . Pneumococcal Polysaccharide-23 01/24/2017  . Tdap 07/18/2007, 10/13/2018  . Zoster Recombinat (Shingrix) 05/04/2018, 12/19/2018    Health Maintenance  Topic Date Due  . COLONOSCOPY  07/18/2018  . INFLUENZA VACCINE  10/11/2019  . TETANUS/TDAP  10/12/2028  . COVID-19 Vaccine  Completed  . Hepatitis C Screening  Completed  . PNA vac Low Risk Adult  Completed     Discussed health benefits of physical activity, and encouraged him to engage in regular exercise appropriate for his age and condition.     2. Essential hypertension Home readings well controlled Continue current medications Recheck metabolic panel  -  Comprehensive Metabolic Panel (CMET) - CBC with Differential - TSH  3. Prostate cancer screening  - PSA  4. Hyperlipidemia, unspecified hyperlipidemia type Previously well controlled Continue statin Repeat FLP and CMP  - Lipid  Profile - TSH  5. Atherosclerosis of native coronary artery of native heart without angina pectoris   6. Nocturia Start Flomax 0.4 mg daily - POCT Urinalysis Dipstick  7. Frequency of urination Likely BPH Start Flomax 0.4 mg daily - POCT Urinalysis Dipstick   No follow-ups on file.     The entirety of the information documented in the History of Present Illness, Review of Systems and Physical Exam were personally obtained by me. Portions of this information were initially documented by the CMA and reviewed by me for thoroughness and accuracy.      Lelon Huh, MD  [facility] [phone] (phone) [phone] (fax)  [place]

## 2019-06-29 ENCOUNTER — Encounter: Payer: Self-pay | Admitting: Family Medicine

## 2019-06-29 ENCOUNTER — Other Ambulatory Visit: Payer: Self-pay

## 2019-06-29 ENCOUNTER — Ambulatory Visit (INDEPENDENT_AMBULATORY_CARE_PROVIDER_SITE_OTHER): Payer: Medicare Other | Admitting: Family Medicine

## 2019-06-29 VITALS — BP 157/84 | HR 55 | Temp 96.8°F | Ht 73.0 in | Wt 231.6 lb

## 2019-06-29 DIAGNOSIS — R350 Frequency of micturition: Secondary | ICD-10-CM

## 2019-06-29 DIAGNOSIS — R351 Nocturia: Secondary | ICD-10-CM

## 2019-06-29 DIAGNOSIS — N401 Enlarged prostate with lower urinary tract symptoms: Secondary | ICD-10-CM

## 2019-06-29 DIAGNOSIS — E785 Hyperlipidemia, unspecified: Secondary | ICD-10-CM | POA: Diagnosis not present

## 2019-06-29 DIAGNOSIS — Z125 Encounter for screening for malignant neoplasm of prostate: Secondary | ICD-10-CM

## 2019-06-29 DIAGNOSIS — Z0000 Encounter for general adult medical examination without abnormal findings: Secondary | ICD-10-CM | POA: Diagnosis not present

## 2019-06-29 DIAGNOSIS — I10 Essential (primary) hypertension: Secondary | ICD-10-CM | POA: Diagnosis not present

## 2019-06-29 DIAGNOSIS — I2510 Atherosclerotic heart disease of native coronary artery without angina pectoris: Secondary | ICD-10-CM

## 2019-06-29 LAB — POCT URINALYSIS DIPSTICK
Bilirubin, UA: NEGATIVE
Glucose, UA: NEGATIVE
Ketones, UA: NEGATIVE
Leukocytes, UA: NEGATIVE
Nitrite, UA: NEGATIVE
Protein, UA: NEGATIVE
Spec Grav, UA: >=1.03 — AB (ref 1.010–1.025)
Urobilinogen, UA: 0.2 E.U./dL
pH, UA: 6 (ref 5.0–8.0)

## 2019-06-29 MED ORDER — TAMSULOSIN HCL 0.4 MG PO CAPS: 0.4000 mg | ORAL_CAPSULE | Freq: Every evening | ORAL | 3 refills | Status: DC

## 2019-06-30 DIAGNOSIS — E785 Hyperlipidemia, unspecified: Secondary | ICD-10-CM | POA: Diagnosis not present

## 2019-06-30 DIAGNOSIS — I10 Essential (primary) hypertension: Secondary | ICD-10-CM | POA: Diagnosis not present

## 2019-07-01 LAB — CBC WITH DIFFERENTIAL/PLATELET
Basophils Absolute: 0.1 10*3/uL (ref 0.0–0.2)
Basos: 2 %
EOS (ABSOLUTE): 0.4 10*3/uL (ref 0.0–0.4)
Eos: 11 %
Hematocrit: 41.9 % (ref 37.5–51.0)
Hemoglobin: 14.3 g/dL (ref 13.0–17.7)
Immature Grans (Abs): 0 10*3/uL (ref 0.0–0.1)
Immature Granulocytes: 0 %
Lymphocytes Absolute: 1 10*3/uL (ref 0.7–3.1)
Lymphs: 27 %
MCH: 33.4 pg — ABNORMAL HIGH (ref 26.6–33.0)
MCHC: 34.1 g/dL (ref 31.5–35.7)
MCV: 98 fL — ABNORMAL HIGH (ref 79–97)
Monocytes Absolute: 0.4 10*3/uL (ref 0.1–0.9)
Monocytes: 10 %
Neutrophils Absolute: 1.8 10*3/uL (ref 1.4–7.0)
Neutrophils: 50 %
Platelets: 157 10*3/uL (ref 150–450)
RBC: 4.28 x10E6/uL (ref 4.14–5.80)
RDW: 12.6 % (ref 11.6–15.4)
WBC: 3.8 10*3/uL (ref 3.4–10.8)

## 2019-07-01 LAB — COMPREHENSIVE METABOLIC PANEL
ALT: 29 IU/L (ref 0–44)
AST: 29 IU/L (ref 0–40)
Albumin/Globulin Ratio: 2.3 — ABNORMAL HIGH (ref 1.2–2.2)
Albumin: 4.4 g/dL (ref 3.7–4.7)
Alkaline Phosphatase: 80 IU/L (ref 39–117)
BUN/Creatinine Ratio: 13 (ref 10–24)
BUN: 16 mg/dL (ref 8–27)
Bilirubin Total: 0.6 mg/dL (ref 0.0–1.2)
CO2: 20 mmol/L (ref 20–29)
Calcium: 9.6 mg/dL (ref 8.6–10.2)
Chloride: 107 mmol/L — ABNORMAL HIGH (ref 96–106)
Creatinine, Ser: 1.21 mg/dL (ref 0.76–1.27)
GFR calc Af Amer: 69 mL/min/{1.73_m2} (ref >59)
GFR calc non Af Amer: 60 mL/min/{1.73_m2} (ref >59)
Globulin, Total: 1.9 g/dL (ref 1.5–4.5)
Glucose: 99 mg/dL (ref 65–99)
Potassium: 4.1 mmol/L (ref 3.5–5.2)
Sodium: 142 mmol/L (ref 134–144)
Total Protein: 6.3 g/dL (ref 6.0–8.5)

## 2019-07-01 LAB — TSH: TSH: 4.67 u[IU]/mL — ABNORMAL HIGH (ref 0.450–4.500)

## 2019-07-01 LAB — LIPID PANEL
Chol/HDL Ratio: 2.5 ratio (ref 0.0–5.0)
Cholesterol, Total: 122 mg/dL (ref 100–199)
HDL: 49 mg/dL (ref >39)
LDL Chol Calc (NIH): 56 mg/dL (ref 0–99)
Triglycerides: 91 mg/dL (ref 0–149)
VLDL Cholesterol Cal: 17 mg/dL (ref 5–40)

## 2019-07-01 LAB — PSA: Prostate Specific Ag, Serum: 0.5 ng/mL (ref 0.0–4.0)

## 2019-08-24 ENCOUNTER — Other Ambulatory Visit: Payer: Self-pay | Admitting: Cardiovascular Disease

## 2019-08-24 DIAGNOSIS — I2510 Atherosclerotic heart disease of native coronary artery without angina pectoris: Secondary | ICD-10-CM

## 2019-08-24 DIAGNOSIS — I10 Essential (primary) hypertension: Secondary | ICD-10-CM

## 2019-09-21 ENCOUNTER — Other Ambulatory Visit: Payer: Self-pay | Admitting: Family Medicine

## 2019-09-21 DIAGNOSIS — N401 Enlarged prostate with lower urinary tract symptoms: Secondary | ICD-10-CM

## 2019-09-22 ENCOUNTER — Telehealth: Payer: Self-pay | Admitting: *Deleted

## 2019-09-22 DIAGNOSIS — K219 Gastro-esophageal reflux disease without esophagitis: Secondary | ICD-10-CM | POA: Diagnosis not present

## 2019-09-22 DIAGNOSIS — Z86010 Personal history of colonic polyps: Secondary | ICD-10-CM | POA: Diagnosis not present

## 2019-09-22 NOTE — Telephone Encounter (Signed)
   Primary Cardiologist: Lauree Chandler, MD  Left a voicemail for patient to call back for further preop assessment.  Per previous recommendations and given lack of interval change in cardiac history, patient can hold aspirin 5-7 days prior to his colonoscopy if necessary and should resume when cleared to do so by his gastroenterologist.    Abigail Butts, PA-C 09/22/2019, 3:31 PM

## 2019-09-22 NOTE — Telephone Encounter (Signed)
° °  [facility] Pre-operative Risk Assessment    HEARTCARE STAFF: - Please ensure there is not already an duplicate clearance open for this procedure. - Under Visit Info/Reason for Call, type in Other and utilize the format Clearance MM/DD/YY or Clearance TBD. Do not use dashes or single digits. - If request is for dental extraction, please clarify the # of teeth to be extracted.  Request for surgical clearance:  1. What type of surgery is being performed? COLONOSCOPY   2. When is this surgery scheduled? 10/12/19   3. What type of clearance is required (medical clearance vs. Pharmacy clearance to hold med vs. Both)? MEDICAL  4. Are there any medications that need to be held prior to surgery and how long? ASA    5. Practice name and name of physician performing surgery? [facility]; MD IS NOT LISTED  6. What is the office phone number? [phone]   7.   What is the office fax number? [phone]  8.   Anesthesia type (None, local, MAC, general) ? MONITORED ANETHESIA   Jonathan Sharp 09/22/2019, 1:23 PM  _________________________________________________________________   (provider comments below)

## 2019-09-23 NOTE — Telephone Encounter (Signed)
I spoke with [name] on the phone today.  He is scheduled for a colonoscopy August 2.  He says this procedure has already been canceled twice.  I explained that per our cardiology pre op protocol if he has not been seen in 6 months technically he would need to be seen in the office for clearance.   He has not had chest pain or unusual shortness of breath.  I told him I would ask Dr. Angelena Form if we could go ahead and clear him for his colonoscopy without seeing him in the office first and arrange for his annual office visit in October.  Kerin Ransom PA-C 09/23/2019 1:00 PM

## 2019-09-23 NOTE — Telephone Encounter (Signed)
Jonathan Sharp is returning Jonathan Sharp's call.

## 2019-09-23 NOTE — Telephone Encounter (Signed)
1st attempt to reach pt re: needing an appt for surgical clearance. Left a message for pt to call back to make an appt

## 2019-09-23 NOTE — Telephone Encounter (Signed)
OK to proceed without a visit. cdm

## 2019-09-23 NOTE — Telephone Encounter (Signed)
Pt has been scheduled to see Dr. Angelena Form 01/04/20 @ 9:00 per message below. Pt has been made aware.

## 2019-09-23 NOTE — Telephone Encounter (Signed)
Patient is returning call.  °

## 2019-09-23 NOTE — Telephone Encounter (Signed)
   Primary Cardiologist: Lauree Chandler, MD  Chart reviewed as part of pre-operative protocol coverage. Given past medical history and time since last visit, based on ACC/AHA guidelines, DELONTAE LAMM would be at acceptable risk for the planned procedure without further cardiovascular testing.   Ok to hold aspirin 5-7 days pre op if needed.   I will route this recommendation to the requesting party via Epic fax function and remove from pre-op pool.  Please call with questions.  Kerin Ransom, PA-C 09/23/2019, 2:05 PM

## 2019-09-23 NOTE — Telephone Encounter (Signed)
   Primary Cardiologist:Christopher Angelena Form, MD  Chart reviewed as part of pre-operative protocol coverage. Because of Jonathan Sharp's past medical history and time since last visit, they will require a follow-up visit in order to better assess preoperative cardiovascular risk.  Pre-op covering staff: - Please schedule appointment and call patient to inform them. If patient already had an upcoming appointment within acceptable timeframe, please add "pre-op clearance" to the appointment notes so provider is aware. - Please contact requesting surgeon's office via preferred method (i.e, phone, fax) to inform them of need for appointment prior to surgery.  Colonoscopy scheduled for August 2nd- please schedule apt with Dr Julianne Handler or an APP ASAP for clearance.   If applicable, this message will also be routed to pharmacy pool and/or primary cardiologist for input on holding anticoagulant/antiplatelet agent as requested below so that this information is available to the clearing provider at time of patient's appointment.   Kerin Ransom, PA-C  09/23/2019, 11:49 AM

## 2019-10-08 ENCOUNTER — Other Ambulatory Visit
Admission: RE | Admit: 2019-10-08 | Discharge: 2019-10-08 | Disposition: A | Discharge status: Home / Self Care | Payer: Medicare Other | Source: Ambulatory Visit | Attending: Gastroenterology | Admitting: Gastroenterology

## 2019-10-08 ENCOUNTER — Other Ambulatory Visit: Payer: Self-pay

## 2019-10-08 DIAGNOSIS — Z20822 Contact with and (suspected) exposure to covid-19: Secondary | ICD-10-CM | POA: Diagnosis not present

## 2019-10-08 DIAGNOSIS — Z01812 Encounter for preprocedural laboratory examination: Secondary | ICD-10-CM | POA: Insufficient documentation

## 2019-10-08 LAB — SARS CORONAVIRUS 2 (TAT 6-24 HRS): SARS Coronavirus 2: NEGATIVE

## 2019-10-09 ENCOUNTER — Encounter: Payer: Self-pay | Admitting: *Deleted

## 2019-10-12 ENCOUNTER — Ambulatory Visit: Payer: Medicare Other | Admitting: Registered Nurse

## 2019-10-12 ENCOUNTER — Other Ambulatory Visit: Payer: Self-pay

## 2019-10-12 ENCOUNTER — Ambulatory Visit
Admission: RE | Admit: 2019-10-12 | Discharge: 2019-10-12 | Disposition: A | Discharge status: Home / Self Care | Payer: Medicare Other | Attending: Gastroenterology | Admitting: Gastroenterology

## 2019-10-12 ENCOUNTER — Encounter
Admission: RE | Disposition: A | Discharge status: Home / Self Care | Payer: Self-pay | Source: Home / Self Care | Attending: Gastroenterology

## 2019-10-12 ENCOUNTER — Encounter: Payer: Self-pay | Admitting: *Deleted

## 2019-10-12 DIAGNOSIS — K635 Polyp of colon: Secondary | ICD-10-CM | POA: Diagnosis not present

## 2019-10-12 DIAGNOSIS — D124 Benign neoplasm of descending colon: Secondary | ICD-10-CM | POA: Insufficient documentation

## 2019-10-12 DIAGNOSIS — Z955 Presence of coronary angioplasty implant and graft: Secondary | ICD-10-CM | POA: Diagnosis not present

## 2019-10-12 DIAGNOSIS — E785 Hyperlipidemia, unspecified: Secondary | ICD-10-CM | POA: Diagnosis not present

## 2019-10-12 DIAGNOSIS — K648 Other hemorrhoids: Secondary | ICD-10-CM | POA: Insufficient documentation

## 2019-10-12 DIAGNOSIS — K219 Gastro-esophageal reflux disease without esophagitis: Secondary | ICD-10-CM | POA: Insufficient documentation

## 2019-10-12 DIAGNOSIS — I110 Hypertensive heart disease with heart failure: Secondary | ICD-10-CM | POA: Insufficient documentation

## 2019-10-12 DIAGNOSIS — Z86010 Personal history of colonic polyps: Secondary | ICD-10-CM | POA: Insufficient documentation

## 2019-10-12 DIAGNOSIS — Z87442 Personal history of urinary calculi: Secondary | ICD-10-CM | POA: Insufficient documentation

## 2019-10-12 DIAGNOSIS — I2510 Atherosclerotic heart disease of native coronary artery without angina pectoris: Secondary | ICD-10-CM | POA: Diagnosis not present

## 2019-10-12 DIAGNOSIS — Z7982 Long term (current) use of aspirin: Secondary | ICD-10-CM | POA: Diagnosis not present

## 2019-10-12 DIAGNOSIS — Z1211 Encounter for screening for malignant neoplasm of colon: Secondary | ICD-10-CM | POA: Insufficient documentation

## 2019-10-12 DIAGNOSIS — K640 First degree hemorrhoids: Secondary | ICD-10-CM | POA: Diagnosis not present

## 2019-10-12 DIAGNOSIS — Z79899 Other long term (current) drug therapy: Secondary | ICD-10-CM | POA: Diagnosis not present

## 2019-10-12 DIAGNOSIS — I252 Old myocardial infarction: Secondary | ICD-10-CM | POA: Diagnosis not present

## 2019-10-12 HISTORY — PX: COLONOSCOPY: SHX5424

## 2019-10-12 SURGERY — COLONOSCOPY
Anesthesia: General

## 2019-10-12 MED ORDER — LIDOCAINE HCL (PF) 2 % IJ SOLN
INTRAMUSCULAR | Status: AC
  Filled 2019-10-12: qty 15

## 2019-10-12 MED ORDER — PHENYLEPHRINE HCL (PRESSORS) 10 MG/ML IV SOLN
INTRAVENOUS | Status: AC
  Filled 2019-10-12: qty 1

## 2019-10-12 MED ORDER — EPHEDRINE SULFATE 50 MG/ML IJ SOLN
INTRAMUSCULAR | Status: DC | PRN
  Administered 2019-10-12: 10 mg via INTRAVENOUS

## 2019-10-12 MED ORDER — EPHEDRINE 5 MG/ML INJ
INTRAVENOUS | Status: AC
  Filled 2019-10-12: qty 4

## 2019-10-12 MED ORDER — SODIUM CHLORIDE 0.9 % IV SOLN: INTRAVENOUS | Status: DC

## 2019-10-12 MED ORDER — PROPOFOL 500 MG/50ML IV EMUL
INTRAVENOUS | Status: DC | PRN
  Administered 2019-10-12: 150 ug/kg/min via INTRAVENOUS

## 2019-10-12 MED ORDER — PROPOFOL 500 MG/50ML IV EMUL
INTRAVENOUS | Status: AC
  Filled 2019-10-12: qty 150

## 2019-10-12 MED ORDER — LIDOCAINE HCL (CARDIAC) PF 100 MG/5ML IV SOSY
PREFILLED_SYRINGE | INTRAVENOUS | Status: DC | PRN
  Administered 2019-10-12: 100 mg via INTRAVENOUS

## 2019-10-12 MED ORDER — DEXMEDETOMIDINE HCL IN NACL 80 MCG/20ML IV SOLN
INTRAVENOUS | Status: AC
  Filled 2019-10-12: qty 20

## 2019-10-12 MED ORDER — PROPOFOL 10 MG/ML IV BOLUS
INTRAVENOUS | Status: DC | PRN
  Administered 2019-10-12: 80 mg via INTRAVENOUS

## 2019-10-12 NOTE — Transfer of Care (Signed)
Immediate Anesthesia Transfer of Care Note  Patient: Jonathan Sharp  Procedure(s) Performed: COLONOSCOPY (N/A )  Patient Location: PACU and Endoscopy Unit   Anesthesia Type:General  Level of Consciousness: drowsy  Airway & Oxygen Therapy: Patient Spontanous Breathing  Post-op Assessment: Report given to RN and Post -op Vital signs reviewed and stable  Post vital signs: Reviewed and stable  Last Vitals:  Vitals Value Taken Time  BP 90/45 10/12/19 0827  Temp    Pulse 51 10/12/19 0829  Resp 21 10/12/19 0829  SpO2 98 % 10/12/19 0829  Vitals shown include unvalidated device data.  Last Pain:  Vitals:   10/12/19 0721  TempSrc: Temporal         Complications: No complications documented.

## 2019-10-12 NOTE — H&P (Signed)
Outpatient short stay form Pre-procedure 10/12/2019 7:51 AM Raylene Miyamoto MD, MPH  Primary Physician: Dr. Caryn Section  Reason for visit:  Surveillance Colonoscopy  History of present illness:   69 y/o gentleman with history oc CAD but not on any blood thinners here for surveillance colonoscopy. No abdominal surgeries, no family history of GI malignancies.     Current Facility-Administered Medications:  .  0.9 %  sodium chloride infusion, , Intravenous, Continuous, Markeda Narvaez, Hilton Cork, MD, Last Rate: 20 mL/hr at 10/12/19 0740, New Bag at 10/12/19 0740  Medications Prior to Admission  Medication Sig Dispense Refill Last Dose  . aspirin 81 MG tablet Take 1 tablet (81 mg total) by mouth daily. 30 tablet 6 10/11/2019 at Unknown time  . carvedilol (COREG) 6.25 MG tablet TAKE 1 TABLET BY MOUTH TWICE A DAY WITH MEALS 180 tablet 2 10/12/2019 at Unknown time  . famotidine (PEPCID) 20 MG tablet TAKE 1 TABLET BY MOUTH TWICE A DAY 180 tablet 3 10/11/2019 at Unknown time  . losartan (COZAAR) 50 MG tablet Take 1 tablet (50 mg total) by mouth 2 (two) times daily. 180 tablet 3 10/12/2019 at Unknown time  . simvastatin (ZOCOR) 20 MG tablet TAKE 1 TABLET BY MOUTH EVERY DAY. NEED APPT FOR FUTURE REFILLS 90 tablet 0 10/11/2019 at Unknown time  . nitroGLYCERIN (NITROSTAT) 0.4 MG SL tablet PLACE 1 TABLET UNDER THE TONGUE EVERY 5 MINUTES AS NEEDED FOR CHEST PAIN,* MAY REPEAT UP TO 3 TIMES 75 tablet 1   . tamsulosin (FLOMAX) 0.4 MG CAPS capsule TAKE 1 CAPSULE BY MOUTH EVERY EVENING 90 capsule 0      No Known Allergies   Past Medical History:  Diagnosis Date  . Coronary artery disease    a. s/p NSTEMI with DES to LAD 06/2008.  Marland Kitchen HTN (hypertension)   . Hyperlipidemia   . Kidney stones   . LV dysfunction    a. EF 40% at time of cath 06/2008, improved to normal on f/u echo.    Review of systems:  Otherwise negative.    Physical Exam  Gen: Alert, oriented. Appears stated age.  HEENT: [place]/AT. PERRLA. Lungs: No  respiratory distress Abd: soft, benign, no masses. BS+ Ext: No edema. Pulses 2+    Planned procedures: Proceed with colonoscopy. The patient understands the nature of the planned procedure, indications, risks, alternatives and potential complications including but not limited to bleeding, infection, perforation, damage to internal organs and possible oversedation/side effects from anesthesia. The patient agrees and gives consent to proceed.  Please refer to procedure notes for findings, recommendations and patient disposition/instructions.     Raylene Miyamoto MD, MPH Gastroenterology 10/12/2019  7:51 AM

## 2019-10-12 NOTE — Interval H&P Note (Signed)
History and Physical Interval Note:  10/12/2019 7:53 AM  Lina Sayre  has presented today for surgery, with the diagnosis of PERSONAL HX.OF COLON POLYPS.  The various methods of treatment have been discussed with the patient and family. After consideration of risks, benefits and other options for treatment, the patient has consented to  Procedure(s): COLONOSCOPY (N/A) as a surgical intervention.  The patient's history has been reviewed, patient examined, no change in status, stable for surgery.  I have reviewed the patient's chart and labs.  Questions were answered to the patient's satisfaction.     Lesly Rubenstein  Ok to proceed with colonoscopy.

## 2019-10-12 NOTE — Anesthesia Preprocedure Evaluation (Addendum)
Anesthesia Evaluation  Patient identified by MRN, date of birth, ID band Patient awake    Reviewed: Allergy & Precautions, H&P , NPO status , Patient's Chart, lab work & pertinent test results  Airway Mallampati: I  TM Distance: >3 FB     Dental  (+) Teeth Intact   Pulmonary neg pulmonary ROS, neg sleep apnea, neg COPD, Not current smoker,    breath sounds clear to auscultation       Cardiovascular hypertension, + CAD, + Past MI, + Cardiac Stents (2010) and +CHF   Rhythm:regular Rate:Normal  - Normal LV size with EF 55-60%. Normal RV size and systolic function. No significant valvular abnormalities.  Grade 1 diastolic dysfunction   Neuro/Psych negative neurological ROS  negative psych ROS   GI/Hepatic Neg liver ROS, GERD  ,  Endo/Other  negative endocrine ROS  Renal/GU negative Renal ROS  negative genitourinary   Musculoskeletal   Abdominal   Peds  Hematology negative hematology ROS (+)   Anesthesia Other Findings Past Medical History: No date: Coronary artery disease     Comment:  a. s/p NSTEMI with DES to LAD 06/2008. No date: HTN (hypertension) No date: Hyperlipidemia No date: Kidney stones No date: LV dysfunction     Comment:  a. EF 40% at time of cath 06/2008, improved to normal on               f/u echo.  Past Surgical History: 2010: CAROTID STENT No date: CATARACT EXTRACTION, BILATERAL No date: EYE SURGERY 10/13/2018: I & D EXTREMITY; Right     Comment:  Procedure: IRRIGATION AND DEBRIDEMENT RIGHT HAND;                Surgeon: Leanora Cover, MD;  Location: [facility];  Service:               Orthopedics;  Laterality: Right; 10/22/2016: INTRAOCULAR LENS INSERTION; Right     Comment:  [facility]     Reproductive/Obstetrics negative OB ROS                            Anesthesia Physical Anesthesia Plan  ASA: II  Anesthesia Plan: General   Post-op Pain Management:    Induction:    PONV Risk Score and Plan: Propofol infusion and TIVA  Airway Management Planned: Nasal Cannula  Additional Equipment:   Intra-op Plan:   Post-operative Plan:   Informed Consent: I have reviewed the patients History and Physical, chart, labs and discussed the procedure including the risks, benefits and alternatives for the proposed anesthesia with the patient or authorized representative who has indicated his/her understanding and acceptance.     Dental Advisory Given  Plan Discussed with: Anesthesiologist, CRNA and Surgeon  Anesthesia Plan Comments:        Anesthesia Quick Evaluation

## 2019-10-12 NOTE — Op Note (Signed)
[facility] Gastroenterology Patient Name: Jonathan Sharp Procedure Date: 10/12/2019 7:53 AM MRN: [id number] Account #: [id number] Date of Birth: [birth year] Admit Type: Outpatient Age: 69 Room: [facility] ENDO ROOM 1 Gender: Male Note Status: Finalized Procedure:             Colonoscopy Indications:           High risk colon cancer surveillance: Personal history                         of colonic polyps Providers:             Andrey Farmer MD, MD Medicines:             Monitored Anesthesia Care Complications:         No immediate complications. Estimated blood loss:                         Minimal. Procedure:             Pre-Anesthesia Assessment:                        - Prior to the procedure, a History and Physical was                         performed, and patient medications and allergies were                         reviewed. The patient is competent. The risks and                         benefits of the procedure and the sedation options and                         risks were discussed with the patient. All questions                         were answered and informed consent was obtained.                         Patient identification and proposed procedure were                         verified by the physician, the nurse, the anesthetist                         and the technician in the endoscopy suite. Mental                         Status Examination: alert and oriented. Airway                         Examination: normal oropharyngeal airway and neck                         mobility. Respiratory Examination: clear to                         auscultation. CV Examination: normal. Prophylactic  Antibiotics: The patient does not require prophylactic                         antibiotics. Prior Anticoagulants: The patient has                         taken no previous anticoagulant or antiplatelet agents                         except for aspirin.  ASA Grade Assessment: II - A                         patient with mild systemic disease. After reviewing                         the risks and benefits, the patient was deemed in                         satisfactory condition to undergo the procedure. The                         anesthesia plan was to use monitored anesthesia care                         (MAC). Immediately prior to administration of                         medications, the patient was re-assessed for adequacy                         to receive sedatives. The heart rate, respiratory                         rate, oxygen saturations, blood pressure, adequacy of                         pulmonary ventilation, and response to care were                         monitored throughout the procedure. The physical                         status of the patient was re-assessed after the                         procedure.                        After obtaining informed consent, the colonoscope was                         passed under direct vision. Throughout the procedure,                         the patient's blood pressure, pulse, and oxygen                         saturations were monitored continuously. The  Colonoscope was introduced through the anus and                         advanced to the the cecum, identified by appendiceal                         orifice and ileocecal valve. The colonoscopy was                         performed without difficulty. The patient tolerated                         the procedure well. The quality of the bowel                         preparation was good. Findings:      The perianal and digital rectal examinations were normal.      A 2 mm polyp was found in the descending colon. The polyp was sessile.       The polyp was removed with a jumbo cold forceps. Resection and retrieval       were complete. Estimated blood loss was minimal.      Internal hemorrhoids were found during  retroflexion. The hemorrhoids       were small.      The exam was otherwise without abnormality on direct and retroflexion       views. Impression:            - One 2 mm polyp in the descending colon, removed with                         a jumbo cold forceps. Resected and retrieved.                        - Internal hemorrhoids.                        - The examination was otherwise normal on direct and                         retroflexion views. Recommendation:        - Discharge patient to home.                        - Resume previous diet.                        - Continue present medications.                        - Await pathology results.                        - Repeat colonoscopy in 10 years for surveillance.                        - Return to referring physician as previously                         scheduled. Procedure Code(s):     --- Professional ---  45380, Colonoscopy, flexible; with biopsy, single or                         multiple Diagnosis Code(s):     --- Professional ---                        Z86.010, Personal history of colonic polyps                        K63.5, Polyp of colon                        K64.8, Other hemorrhoids CPT copyright 2019 American Medical Association. All rights reserved. The codes documented in this report are preliminary and upon coder review may  be revised to meet current compliance requirements. Andrey Farmer, MD Andrey Farmer MD, MD 10/12/2019 8:27:56 AM Number of Addenda: 0 Note Initiated On: 10/12/2019 7:53 AM Scope Withdrawal Time: 0 hours 10 minutes 11 seconds  Total Procedure Duration: 0 hours 21 minutes 10 seconds  Estimated Blood Loss:  Estimated blood loss: none. Estimated blood loss was                         minimal. Estimated blood loss was minimal.      [facility]

## 2019-10-13 LAB — SURGICAL PATHOLOGY

## 2019-10-13 NOTE — Anesthesia Postprocedure Evaluation (Signed)
Anesthesia Post Note  Patient: Jonathan Sharp  Procedure(s) Performed: COLONOSCOPY (N/A )  Patient location during evaluation: PACU Anesthesia Type: General Level of consciousness: awake and alert Pain management: pain level controlled Vital Signs Assessment: post-procedure vital signs reviewed and stable Respiratory status: spontaneous breathing, nonlabored ventilation and respiratory function stable Cardiovascular status: blood pressure returned to baseline and stable Postop Assessment: no apparent nausea or vomiting Anesthetic complications: no   No complications documented.   Last Vitals:  Vitals:   10/12/19 0848 10/12/19 0858  BP: 123/66 (!) 141/59  Pulse: 48 47  Resp: 13 16  Temp:    SpO2: 100% 100%    Last Pain:  Vitals:   10/12/19 0858  TempSrc:   PainSc: 0-No pain                 Brett Canales Manasa Spease

## 2019-10-14 ENCOUNTER — Other Ambulatory Visit: Payer: Self-pay | Admitting: Cardiovascular Disease

## 2019-10-14 DIAGNOSIS — I10 Essential (primary) hypertension: Secondary | ICD-10-CM

## 2019-10-14 DIAGNOSIS — I2510 Atherosclerotic heart disease of native coronary artery without angina pectoris: Secondary | ICD-10-CM

## 2019-11-21 ENCOUNTER — Other Ambulatory Visit: Payer: Self-pay | Admitting: Cardiovascular Disease

## 2019-11-21 DIAGNOSIS — I2510 Atherosclerotic heart disease of native coronary artery without angina pectoris: Secondary | ICD-10-CM

## 2019-11-21 DIAGNOSIS — I10 Essential (primary) hypertension: Secondary | ICD-10-CM

## 2019-12-16 ENCOUNTER — Other Ambulatory Visit: Payer: Self-pay | Admitting: Family Medicine

## 2019-12-16 DIAGNOSIS — N401 Enlarged prostate with lower urinary tract symptoms: Secondary | ICD-10-CM

## 2019-12-16 NOTE — Telephone Encounter (Signed)
Requested Prescriptions  Pending Prescriptions Disp Refills  . tamsulosin (FLOMAX) 0.4 MG CAPS capsule [Pharmacy Med Name: TAMSULOSIN HCL 0.4 MG CAPSULE] 90 capsule 2    Sig: TAKE 1 CAPSULE BY MOUTH EVERY EVENING     Urology: Alpha-Adrenergic Blocker Failed - 12/16/2019  1:28 AM      Failed - Last BP in normal range    BP Readings from Last 1 Encounters:  10/12/19 (!) 141/59         Passed - Valid encounter within last 12 months    Recent Outpatient Visits          5 months ago Encounter for [employer] annual wellness exam   [facility] Birdie Sons, MD   1 year ago Annual physical exam   [facility] Birdie Sons, MD   2 years ago Annual physical exam   [facility] Birdie Sons, MD   3 years ago Preop examination   [facility] Birdie Sons, MD   3 years ago Medicare annual wellness visit, initial   [place], Kirstie Peri, MD      Future Appointments            In 2 weeks Angelena Form, Annita Brod, MD [place], [facility]

## 2020-01-01 NOTE — Progress Notes (Signed)
Chief Complaint  Patient presents with  . Follow-up    CAD   History of Present Illness: 69 yo male with h/o CAD and hyperlipidemia here today for follow up. In April 2010 he had a non-ST elevation MI treated with drug eluting stent placement in the LAD. His ejection fraction was initially 40% but this improved to 60%. Stress myoview 09/01/12 in setting of admission for chest pain with LVEF=55%, no ischemia. He had LE edema with Norvasc. He did not tolerate high dose statin therapy. Zocor lowered to 20 mg per day due to muscle aches. He has tolerated the 20 mg daily dose. Echo October 2019 with LVEF=55-60%. Grade 1 diastolic dysfunction. No significant valve disease.   He is here today for follow up. The patient denies any chest pain, dyspnea, palpitations, lower extremity edema, orthopnea, PND, dizziness, near syncope or syncope.   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    Primary Care Physician: Birdie Sons, MD  Past Medical History:  Diagnosis Date  . Coronary artery disease    a. s/p NSTEMI with DES to LAD 06/2008.  Marland Kitchen HTN (hypertension)   . Hyperlipidemia   . Kidney stones   . LV dysfunction    a. EF 40% at time of cath 06/2008, improved to normal on f/u echo.    Past Surgical History:  Procedure Laterality Date  . CAROTID STENT  2010  . CATARACT EXTRACTION, BILATERAL    . COLONOSCOPY N/A 10/12/2019   Procedure: COLONOSCOPY;  Surgeon: Lesly Rubenstein, MD;  Location: [facility] ENDOSCOPY;  Service: Endoscopy;  Laterality: N/A;  . EYE SURGERY    . I & D EXTREMITY Right 10/13/2018   Procedure: IRRIGATION AND DEBRIDEMENT RIGHT HAND;  Surgeon: Leanora Cover, MD;  Location: [place];  Service: Orthopedics;  Laterality: Right;  . INTRAOCULAR LENS INSERTION Right 10/22/2016   [facility]    Current Outpatient Medications  Medication Sig Dispense Refill  . aspirin 81 MG tablet Take 1 tablet (81 mg total) by mouth daily. 30 tablet 6  . carvedilol (COREG) 6.25 MG tablet Please keep  upcoming appt for future refills. 180 tablet 0  . famotidine (PEPCID) 20 MG tablet TAKE 1 TABLET BY MOUTH TWICE A DAY 180 tablet 3  . losartan (COZAAR) 50 MG tablet Take 1 tablet (50 mg total) by mouth 2 (two) times daily. 180 tablet 3  . nitroGLYCERIN (NITROSTAT) 0.4 MG SL tablet PLACE 1 TABLET UNDER THE TONGUE EVERY 5 MINUTES AS NEEDED FOR CHEST PAIN,* MAY REPEAT UP TO 3 TIMES 75 tablet 1  . simvastatin (ZOCOR) 20 MG tablet Take 1 tablet (20 mg total) by mouth daily at 6 PM. Please keep upcoming appt in October with Dr. Angelena Form for future refills. Thank you 90 tablet 0  . tamsulosin (FLOMAX) 0.4 MG CAPS capsule TAKE 1 CAPSULE BY MOUTH EVERY EVENING 90 capsule 2   No current facility-administered medications for this visit.    No Known Allergies  Social History   Socioeconomic History  . Marital status: Married    Spouse name: Not on file  . Number of children: 2  . Years of education: Not on file  . Highest education level: Some college, no degree  Occupational History  . Occupation: MAINTANCE SERVICE    Comment: retired    Comment: Owns store / Games developer  Tobacco Use  . Smoking status: Never Smoker  . Smokeless tobacco: Never Used  Vaping Use  . Vaping Use: Never used  Substance and  Sexual Activity  . Alcohol use: Yes    Alcohol/week: 1.0 - 2.0 standard drink    Types: 1 - 2 Shots of liquor per week  . Drug use: No  . Sexual activity: Not on file  Other Topics Concern  . 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 Difficulty of Paying Living Expenses: Not on file  Food Insecurity:   . Worried About Charity fundraiser in the Last Year: Not on file  . Ran Out of Food in the Last Year: Not on file  Transportation Needs:   . Lack of Transportation (Medical): Not on  file  . Lack of Transportation (Non-Medical): Not on file  Physical Activity: Sufficiently Active  . Days of Exercise per Week: 3 days  . Minutes of Exercise per Session: 60 min  Stress:   . Feeling of Stress : Not on file  Social Connections:   . Frequency of Communication with Friends and Family: Not on file  . Frequency of Social Gatherings with Friends and Family: Not on file  . Attends Religious Services: Not on file  . Active Member of Clubs or Organizations: Not on file  . Attends Archivist Meetings: Not on file  . Marital Status: Not on file  Intimate Partner Violence:   . Fear of Current or Ex-Partner: Not on file  . Emotionally Abused: Not on file  . Physically Abused: Not on file  . Sexually Abused: Not on file    Family History  Problem Relation Age of Onset  . Heart attack Father 73  . Diabetes Brother        over weight    Review of Systems:  As stated in the HPI and otherwise negative.   BP 130/82   Pulse (!) 51   Ht 6\' 1"  (1.854 m)   Wt 234 lb 9.6 oz (106.4 kg)   SpO2 99%   BMI 30.95 kg/m   Physical Examination:  General: Well developed, well nourished, NAD  HEENT: OP clear, mucus membranes moist  SKIN: warm, dry. No rashes. Neuro: No focal deficits  Musculoskeletal: Muscle strength 5/5 all ext  Psychiatric: Mood and affect normal  Neck: No JVD, no carotid bruits, no thyromegaly, no lymphadenopathy.  Lungs:Clear bilaterally, no wheezes, rhonci, crackles Cardiovascular: Regular rate and rhythm. No murmurs, gallops or rubs. Abdomen:Soft. Bowel sounds present. Non-tender.  Extremities: No lower extremity edema. Pulses are 2 + in the bilateral DP/PT.  EKG:  EKG is ordered today. The ekg ordered today demonstrates Sinus bradycardia  Recent Labs: 06/30/2019: ALT 29; BUN 16; Creatinine, Ser 1.21; Hemoglobin 14.3; Platelets 157; Potassium 4.1; Sodium 142; TSH 4.670   Lipid Panel    Component Value Date/Time   CHOL 122 06/30/2019 0808    TRIG 91 06/30/2019 0808   HDL 49 06/30/2019 0808   CHOLHDL 2.5 06/30/2019 0808   CHOLHDL 2.3 01/25/2017 0801   VLDL 29 12/14/2015 0827   LDLCALC 56 06/30/2019 0808   LDLCALC 50 01/25/2017 0801     Wt Readings from Last 3 Encounters:  01/04/20 234 lb 9.6 oz (106.4 kg)  10/12/19 (!) 225 lb (102.1 kg)  06/29/19 231 lb 9.6 oz (105.1 kg)     Other studies Reviewed: Additional studies/  records that were reviewed today include: . Review of the above records demonstrates:    Assessment and Plan:   1. CAD without angina: No chest pain. Continue ASA, beta blocker and statin.      2. HYPERLIPIDEMIA: Lipids followed in primary care. LDL 56 April 2021. Continue statin  3. HTN: BP is controlled. Continue current therapy.   Current medicines are reviewed at length with the patient today.  The patient does not have concerns regarding medicines.  The following changes have been made:  no change  Labs/ tests ordered today include:   Orders Placed This Encounter  Procedures  . EKG 12-Lead     Disposition:   FU with me in 12  months   Signed, Lauree Chandler, MD 01/04/2020 9:23 AM    [place] [address], [place], [place]  [address] Phone: [phone]; Fax: [phone]

## 2020-01-04 ENCOUNTER — Encounter: Payer: Self-pay | Admitting: Cardiovascular Disease

## 2020-01-04 ENCOUNTER — Other Ambulatory Visit: Payer: Self-pay

## 2020-01-04 ENCOUNTER — Ambulatory Visit: Payer: Medicare Other | Admitting: Cardiovascular Disease

## 2020-01-04 VITALS — BP 130/82 | HR 51 | Ht 73.0 in | Wt 234.6 lb

## 2020-01-04 DIAGNOSIS — E7800 Pure hypercholesterolemia, unspecified: Secondary | ICD-10-CM

## 2020-01-04 DIAGNOSIS — I2510 Atherosclerotic heart disease of native coronary artery without angina pectoris: Secondary | ICD-10-CM

## 2020-01-04 DIAGNOSIS — I10 Essential (primary) hypertension: Secondary | ICD-10-CM | POA: Diagnosis not present

## 2020-01-04 NOTE — Patient Instructions (Signed)
Medication Instructions:  The current medical regimen is effective;  continue present plan and medications.  *If you need a refill on your cardiac medications before your next appointment, please call your pharmacy*  Follow-Up: At [facility], you and your health needs are our priority.  As part of our continuing mission to provide you with exceptional heart care, we have created designated Provider Care Teams.  These Care Teams include your primary Cardiologist (physician) and Advanced Practice Providers (APPs -  Physician Assistants and Nurse Practitioners) who all work together to provide you with the care you need, when you need it.  We recommend signing up for the patient portal called "MyChart".  Sign up information is provided on this After Visit Summary.  MyChart is used to connect with patients for Virtual Visits (Telemedicine).  Patients are able to view lab/test results, encounter notes, upcoming appointments, etc.  Non-urgent messages can be sent to your provider as well.   To learn more about what you can do with MyChart, go to [email or web address].    Your next appointment:   12 month(s)  The format for your next appointment:   In Person  Provider:   Lauree Chandler, MD   Thank you for choosing [facility]!!

## 2020-01-11 ENCOUNTER — Other Ambulatory Visit: Payer: Self-pay | Admitting: Cardiovascular Disease

## 2020-01-11 DIAGNOSIS — I2510 Atherosclerotic heart disease of native coronary artery without angina pectoris: Secondary | ICD-10-CM

## 2020-01-11 DIAGNOSIS — I10 Essential (primary) hypertension: Secondary | ICD-10-CM

## 2020-01-15 ENCOUNTER — Other Ambulatory Visit: Payer: Self-pay | Admitting: Cardiovascular Disease

## 2020-01-15 NOTE — Telephone Encounter (Signed)
Pt's pharmacy is requesting a refill on famotidine (Pepcid). Would Dr. Angelena Form like to refill this medication? Please address

## 2020-02-22 ENCOUNTER — Other Ambulatory Visit: Payer: Self-pay | Admitting: Cardiovascular Disease

## 2020-02-22 DIAGNOSIS — I10 Essential (primary) hypertension: Secondary | ICD-10-CM

## 2020-02-22 DIAGNOSIS — I2510 Atherosclerotic heart disease of native coronary artery without angina pectoris: Secondary | ICD-10-CM

## 2020-02-25 NOTE — Progress Notes (Signed)
Subjective:   Jonathan Sharp is a 69 y.o. male who presents for Medicare Annual/Subsequent preventive examination.  Review of Systems    N/A  Cardiac Risk Factors include: advanced age (>39men, >56 women);dyslipidemia;male gender;hypertension;obesity (BMI >30kg/m2)     Objective:    Today's Vitals   02/29/20 0834  BP: 108/68  Pulse: 61  Temp: 98 F (36.7 C)  TempSrc: Oral  SpO2: 98%  Weight: 240 lb (108.9 kg)  Height: 6\' 1"  (1.854 m)  PainSc: 0-No pain   Body mass index is 31.66 kg/m.  Advanced Directives 02/29/2020 10/12/2019 02/03/2019 01/27/2018 01/24/2017 02/01/2016 09/23/2014  Does Patient Have a Medical Advance Directive? Yes Yes Yes Yes Yes Yes No  Type of Paramedic of [place];Living will [place];Living will [place];Living will [place];Living will [place];Living will [place];Living will -  Copy of [place] in Chart? No - copy requested Yes - validated most recent copy scanned in chart (See row information) No - copy requested No - copy requested No - copy requested No - copy requested -  Would patient like information on creating a medical advance directive? - - - - - - No - patient declined information  Pre-existing out of facility DNR order (yellow form or pink MOST form) - - - - - - -    Current Medications (verified) Outpatient Encounter Medications as of 02/29/2020  Medication Sig  . aspirin 81 MG tablet Take 1 tablet (81 mg total) by mouth daily.  . carvedilol (COREG) 6.25 MG tablet TAKE 1 TABLET BY MOUTH TWICE A DAY WITH A MEAL.  . famotidine (PEPCID) 20 MG tablet TAKE 1 TABLET BY MOUTH TWICE A DAY  . losartan (COZAAR) 50 MG tablet Take 1 tablet (50 mg total) by mouth 2 (two) times daily.  . nitroGLYCERIN (NITROSTAT) 0.4 MG SL tablet PLACE 1 TABLET UNDER THE TONGUE EVERY 5 MINUTES AS NEEDED FOR CHEST PAIN,* MAY  REPEAT UP TO 3 TIMES  . simvastatin (ZOCOR) 20 MG tablet TAKE 1 TABLET BY MOUTH DAILY AT 6 PM. PLEASE KEEP UPCOMING APPT IN OCTOBER WITH DR. Angelena Form FOR FUTURE REFILLS. THANK YOU  . tamsulosin (FLOMAX) 0.4 MG CAPS capsule TAKE 1 CAPSULE BY MOUTH EVERY EVENING   No facility-administered encounter medications on file as of 02/29/2020.    Allergies (verified) Patient has no known allergies.   History: Past Medical History:  Diagnosis Date  . Coronary artery disease    a. s/p NSTEMI with DES to LAD 06/2008.  Marland Kitchen HTN (hypertension)   . Hyperlipidemia   . Kidney stones   . LV dysfunction    a. EF 40% at time of cath 06/2008, improved to normal on f/u echo.   Past Surgical History:  Procedure Laterality Date  . CAROTID STENT  2010  . CATARACT EXTRACTION, BILATERAL    . COLONOSCOPY N/A 10/12/2019   Procedure: COLONOSCOPY;  Surgeon: Lesly Rubenstein, MD;  Location: [facility] ENDOSCOPY;  Service: Endoscopy;  Laterality: N/A;  . EYE SURGERY    . I & D EXTREMITY Right 10/13/2018   Procedure: IRRIGATION AND DEBRIDEMENT RIGHT HAND;  Surgeon: Leanora Cover, MD;  Location: [place];  Service: Orthopedics;  Laterality: Right;  . INTRAOCULAR LENS INSERTION Right 10/22/2016   [facility]   Family History  Problem Relation Age of Onset  . Heart attack Father 61  . Diabetes Brother        over  weight   Social History   Socioeconomic History  . Marital status: Married    Spouse name: Not on file  . Number of children: 2  . Years of education: Not on file  . Highest education level: Some college, no degree  Occupational History  . Occupation: MAINTANCE SERVICE    Comment: retired    Comment: Owns store / Games developer  Tobacco Use  . Smoking status: Never Smoker  . Smokeless tobacco: Never Used  Vaping Use  . Vaping Use: Never used  Substance and Sexual Activity  . Alcohol use: Yes    Alcohol/week: 1.0 - 2.0 standard drink    Types: 1 - 2 Shots of liquor per week  . Drug use: No  . Sexual activity: Not  on file  Other Topics Concern  . 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 Difficulty of Paying Living Expenses: Not hard at all  Food Insecurity: No Food Insecurity  . Worried About Charity fundraiser in the Last Year: Never true  . Ran Out of Food in the Last Year: Never true  Transportation Needs: No Transportation Needs  . Lack of Transportation (Medical): No  . Lack of Transportation (Non-Medical): No  Physical Activity: Insufficiently Active  . Days of Exercise per Week: 2 days  . Minutes of Exercise per Session: 70 min  Stress: No Stress Concern Present  . Feeling of Stress : Only a little  Social Connections: Socially Integrated  . Frequency of Communication with Friends and Family: Three times a week  . Frequency of Social Gatherings with Friends and Family: More than three times a week  . Attends Religious Services: More than 4 times per year  . Active Member of Clubs or Organizations: Yes  . Attends Archivist Meetings: More than 4 times per year  . Marital Status: Married    Tobacco Counseling Counseling given: Not Answered   Clinical Intake:  Pre-visit preparation completed: Yes  Pain : No/denies pain Pain Score: 0-No pain     Nutritional Status: BMI > 30  Obese Nutritional Risks: None Diabetes: No  How often do you need to have someone help you when you read instructions, pamphlets, or other written materials from your doctor or pharmacy?: 1 - Never  Diabetic? No  Interpreter Needed?: No  Information entered by :: [facility], LPN   Activities of Daily Living In your present state of health, do you have any difficulty performing the following activities: 02/29/2020 06/29/2019  Hearing? N Y  Comment Wears bilateral hearing  aids. -  Vision? N N  Difficulty concentrating or making decisions? N N  Walking or climbing stairs? N N  Dressing or bathing? N N  Doing errands, shopping? N N  Preparing Food and eating ? N -  Using the Toilet? N -  In the past six months, have you accidently leaked urine? N -  Do you have problems with loss of bowel control? N -  Managing your Medications? N -  Managing your Finances? N -  Housekeeping or managing your Housekeeping? N -  Some recent data might be hidden    Patient Care Team: Birdie Sons, MD as PCP - General (Family Medicine) Lauree Chandler  D, MD as PCP - Cardiology (Cardiology) Emmaline Kluver., MD as Consulting Physician (Rheumatology) Haig Prophet Hilton Cork, MD as Consulting Physician (Gastroenterology) System, Provider Not In  Indicate any recent Medical Services you may have received from other than [facility] providers in the past year (date may be approximate).     Assessment:   This is a routine wellness examination for Jabril.  Hearing/Vision screen No exam data present  Dietary issues and exercise activities discussed: Current Exercise Habits: Home exercise routine, Type of exercise: walking, Time (Minutes): > 60, Frequency (Times/Week): 2, Weekly Exercise (Minutes/Week): 0, Intensity: Mild, Exercise limited by: None identified  Goals    . Increase water intake     Starting 02/01/16, I will increase my water intake to 6-8 8 oz glasses a day.      Depression Screen PHQ 2/9 Scores 02/29/2020 06/29/2019 02/03/2019 03/19/2018 01/27/2018 01/24/2017 02/01/2016  PHQ - 2 Score 0 0 0 0 0 0 0    Fall Risk Fall Risk  02/29/2020 06/29/2019 02/03/2019 03/19/2018 01/27/2018  Falls in the past year? 0 0 0 0 0  Number falls in past yr: 0 0 0 - -  Injury with Fall? 0 0 0 - -    FALL RISK PREVENTION PERTAINING TO THE HOME:  Any stairs in or around the home? Yes  If so, are there any without handrails? No  Home free of loose throw rugs in walkways,  pet beds, electrical cords, etc? Yes  Adequate lighting in your home to reduce risk of falls? Yes   ASSISTIVE DEVICES UTILIZED TO PREVENT FALLS:  Life alert? No  Use of a cane, walker or w/c? No  Grab bars in the bathroom? Yes  Shower chair or bench in shower? No  Elevated toilet seat or a handicapped toilet? Yes   TIMED UP AND GO:  Was the test performed? Yes .  Length of time to ambulate 10 feet: 10 sec.   Gait steady and fast without use of assistive device  Cognitive Function: Normal cognitive status assessed by direct observation by this Nurse Health Advisor. No abnormalities found.      6CIT Screen 01/27/2018 02/01/2016  What Year? 0 points 0 points  What month? 0 points 0 points  What time? 0 points 0 points  Count back from 20 0 points 0 points  Months in reverse 0 points 0 points  Repeat phrase 0 points 0 points  Total Score 0 0    Immunizations Immunization History  Administered Date(s) Administered  . Fluad Quad(high Dose 65+) 12/19/2018  . Influenza-Unspecified 12/20/2015, 12/21/2016, 01/06/2018, 12/11/2019  . Moderna Sars-Covid-2 Vaccination 04/24/2019, 05/20/2019, 01/21/2020  . Pneumococcal Conjugate-13 02/01/2016  . Pneumococcal Polysaccharide-23 01/24/2017  . Tdap 07/18/2007, 10/13/2018  . Zoster Recombinat (Shingrix) 05/04/2018, 12/19/2018    TDAP status: Up to date  Flu Vaccine status: Up to date  Pneumococcal vaccine status: Up to date  Covid-19 vaccine status: Completed vaccines  Qualifies for Shingles Vaccine? Yes   Zostavax completed No   Shingrix Completed?: Yes  Screening Tests Health Maintenance  Topic Date Due  . TETANUS/TDAP  10/12/2028  . COLONOSCOPY  10/11/2029  . INFLUENZA VACCINE  Completed  . COVID-19 Vaccine  Completed  . Hepatitis C Screening  Completed  . PNA vac Low Risk Adult  Completed    Health Maintenance  There are no preventive care reminders to display for this patient.  Colorectal cancer screening: Type  of screening: Colonoscopy. Completed 10/12/19. Repeat every 10 years  Lung  Cancer Screening: (Low Dose CT Chest recommended if Age 23-80 years, 30 pack-year currently smoking OR have quit w/in 15years.) does not qualify.    Additional Screening:  Hepatitis C Screening: Up to date  Vision Screening: Recommended annual ophthalmology exams for early detection of glaucoma and other disorders of the eye. Is the patient up to date with their annual eye exam?  Yes  Who is the provider or what is the name of the office in which the patient attends annual eye exams? Dr Leeanne Deed in [place] If pt is not established with a provider, would they like to be referred to a provider to establish care? No .   Dental Screening: Recommended annual dental exams for proper oral hygiene  Community Resource Referral / Chronic Care Management: CRR required this visit?  No   CCM required this visit?  No      Plan:     I have personally reviewed and noted the following in the patient's chart:   . Medical and social history . Use of alcohol, tobacco or illicit drugs  . Current medications and supplements . Functional ability and status . Nutritional status . Physical activity . Advanced directives . List of other physicians . Hospitalizations, surgeries, and ER visits in previous 12 months . Vitals . Screenings to include cognitive, depression, and falls . Referrals and appointments  In addition, I have reviewed and discussed with patient certain preventive protocols, quality metrics, and best practice recommendations. A written personalized care plan for preventive services as well as general preventive health recommendations were provided to patient.     Eschol Auxier Riddle, [place]   60/63/0160   Nurse Notes: None.

## 2020-02-29 ENCOUNTER — Other Ambulatory Visit: Payer: Self-pay

## 2020-02-29 ENCOUNTER — Ambulatory Visit (INDEPENDENT_AMBULATORY_CARE_PROVIDER_SITE_OTHER): Payer: Medicare Other

## 2020-02-29 VITALS — BP 108/68 | HR 61 | Temp 98.0°F | Ht 73.0 in | Wt 240.0 lb

## 2020-02-29 DIAGNOSIS — Z0000 Encounter for general adult medical examination without abnormal findings: Secondary | ICD-10-CM | POA: Diagnosis not present

## 2020-02-29 NOTE — Patient Instructions (Signed)
Jonathan Sharp , Thank you for taking time to come for your Medicare Wellness Visit. I appreciate your ongoing commitment to your health goals. Please review the following plan we discussed and let me know if I can assist you in the future.   Screening recommendations/referrals: Colonoscopy: Up to date, due 10/2029 Recommended yearly ophthalmology/optometry visit for glaucoma screening and checkup Recommended yearly dental visit for hygiene and checkup  Vaccinations: Influenza vaccine: Done 12/2019 Pneumococcal vaccine: Completed series Tdap vaccine: Up to date, due 10/2028 Shingles vaccine: Completed series    Advanced directives: Please bring a copy of your POA (Power of [place]) and/or Living Will to your next appointment.   Conditions/risks identified: Starting 02/01/16, I will increase my water intake to 6-8 8 oz glasses a day.  Next appointment: 06/29/20 @ 9:00 AM with Dr. Caryn Section  Preventive Care 65 Years and Older, Male Preventive care refers to lifestyle choices and visits with your health care provider that can promote health and wellness. What does preventive care include?  A yearly physical exam. This is also called an annual well check.  Dental exams once or twice a year.  Routine eye exams. Ask your health care provider how often you should have your eyes checked.  Personal lifestyle choices, including:  Daily care of your teeth and gums.  Regular physical activity.  Eating a healthy diet.  Avoiding tobacco and drug use.  Limiting alcohol use.  Practicing safe sex.  Taking low doses of aspirin every day.  Taking vitamin and mineral supplements as recommended by your health care provider. What happens during an annual well check? The services and screenings done by your health care provider during your annual well check will depend on your age, overall health, lifestyle risk factors, and family history of disease. Counseling  Your health care provider may ask you  questions about your:  Alcohol use.  Tobacco use.  Drug use.  Emotional well-being.  Home and relationship well-being.  Sexual activity.  Eating habits.  History of falls.  Memory and ability to understand (cognition).  Work and work Statistician. Screening  You may have the following tests or measurements:  Height, weight, and BMI.  Blood pressure.  Lipid and cholesterol levels. These may be checked every 5 years, or more frequently if you are over 64 years old.  Skin check.  Lung cancer screening. You may have this screening every year starting at age 16 if you have a 30-pack-year history of smoking and currently smoke or have quit within the past 15 years.  Fecal occult blood test (FOBT) of the stool. You may have this test every year starting at age 69.  Flexible sigmoidoscopy or colonoscopy. You may have a sigmoidoscopy every 5 years or a colonoscopy every 10 years starting at age 42.  Prostate cancer screening. Recommendations will vary depending on your family history and other risks.  Hepatitis C blood test.  Hepatitis B blood test.  Sexually transmitted disease (STD) testing.  Diabetes screening. This is done by checking your blood sugar (glucose) after you have not eaten for a while (fasting). You may have this done every 1-3 years.  Abdominal aortic aneurysm (AAA) screening. You may need this if you are a current or former smoker.  Osteoporosis. You may be screened starting at age 64 if you are at high risk. Talk with your health care provider about your test results, treatment options, and if necessary, the need for more tests. Vaccines  Your health care provider may recommend  certain vaccines, such as:  Influenza vaccine. This is recommended every year.  Tetanus, diphtheria, and acellular pertussis (Tdap, Td) vaccine. You may need a Td booster every 10 years.  Zoster vaccine. You may need this after age 52.  Pneumococcal 13-valent conjugate  (PCV13) vaccine. One dose is recommended after age 56.  Pneumococcal polysaccharide (PPSV23) vaccine. One dose is recommended after age 34. Talk to your health care provider about which screenings and vaccines you need and how often you need them. This information is not intended to replace advice given to you by your health care provider. Make sure you discuss any questions you have with your health care provider. Document Released: 03/25/2015 Document Revised: 11/16/2015 Document Reviewed: 12/28/2014 Elsevier Interactive Patient Education  2017 [place] Prevention in the Home Falls can cause injuries. They can happen to people of all ages. There are many things you can do to make your home safe and to help prevent falls. What can I do on the outside of my home?  Regularly fix the edges of walkways and driveways and fix any cracks.  Remove anything that might make you trip as you walk through a door, such as a raised step or threshold.  Trim any bushes or trees on the path to your home.  Use bright outdoor lighting.  Clear any walking paths of anything that might make someone trip, such as rocks or tools.  Regularly check to see if handrails are loose or broken. Make sure that both sides of any steps have handrails.  Any raised decks and porches should have guardrails on the edges.  Have any leaves, snow, or ice cleared regularly.  Use sand or salt on walking paths during winter.  Clean up any spills in your garage right away. This includes oil or grease spills. What can I do in the bathroom?  Use night lights.  Install grab bars by the toilet and in the tub and shower. Do not use towel bars as grab bars.  Use non-skid mats or decals in the tub or shower.  If you need to sit down in the shower, use a plastic, non-slip stool.  Keep the floor dry. Clean up any water that spills on the floor as soon as it happens.  Remove soap buildup in the tub or shower  regularly.  Attach bath mats securely with double-sided non-slip rug tape.  Do not have throw rugs and other things on the floor that can make you trip. What can I do in the bedroom?  Use night lights.  Make sure that you have a light by your bed that is easy to reach.  Do not use any sheets or blankets that are too big for your bed. They should not hang down onto the floor.  Have a firm chair that has side arms. You can use this for support while you get dressed.  Do not have throw rugs and other things on the floor that can make you trip. What can I do in the kitchen?  Clean up any spills right away.  Avoid walking on wet floors.  Keep items that you use a lot in easy-to-reach places.  If you need to reach something above you, use a strong step stool that has a grab bar.  Keep electrical cords out of the way.  Do not use floor polish or wax that makes floors slippery. If you must use wax, use non-skid floor wax.  Do not have throw rugs and other  things on the floor that can make you trip. What can I do with my stairs?  Do not leave any items on the stairs.  Make sure that there are handrails on both sides of the stairs and use them. Fix handrails that are broken or loose. Make sure that handrails are as long as the stairways.  Check any carpeting to make sure that it is firmly attached to the stairs. Fix any carpet that is loose or worn.  Avoid having throw rugs at the top or bottom of the stairs. If you do have throw rugs, attach them to the floor with carpet tape.  Make sure that you have a light switch at the top of the stairs and the bottom of the stairs. If you do not have them, ask someone to add them for you. What else can I do to help prevent falls?  Wear shoes that:  Do not have high heels.  Have rubber bottoms.  Are comfortable and fit you well.  Are closed at the toe. Do not wear sandals.  If you use a stepladder:  Make sure that it is fully  opened. Do not climb a closed stepladder.  Make sure that both sides of the stepladder are locked into place.  Ask someone to hold it for you, if possible.  Clearly mark and make sure that you can see:  Any grab bars or handrails.  First and last steps.  Where the edge of each step is.  Use tools that help you move around (mobility aids) if they are needed. These include:  Canes.  Walkers.  Scooters.  Crutches.  Turn on the lights when you go into a dark area. Replace any light bulbs as soon as they burn out.  Set up your furniture so you have a clear path. Avoid moving your furniture around.  If any of your floors are uneven, fix them.  If there are any pets around you, be aware of where they are.  Review your medicines with your doctor. Some medicines can make you feel dizzy. This can increase your chance of falling. Ask your doctor what other things that you can do to help prevent falls. This information is not intended to replace advice given to you by your health care provider. Make sure you discuss any questions you have with your health care provider. Document Released: 12/23/2008 Document Revised: 08/04/2015 Document Reviewed: 04/02/2014 Elsevier Interactive Patient Education  2017 [employer].

## 2020-03-03 DIAGNOSIS — H40023 Open angle with borderline findings, high risk, bilateral: Secondary | ICD-10-CM | POA: Diagnosis not present

## 2020-03-03 DIAGNOSIS — H5203 Hypermetropia, bilateral: Secondary | ICD-10-CM | POA: Diagnosis not present

## 2020-03-10 ENCOUNTER — Telehealth (INDEPENDENT_AMBULATORY_CARE_PROVIDER_SITE_OTHER): Payer: Medicare Other | Admitting: Family Medicine

## 2020-03-10 ENCOUNTER — Encounter: Payer: Self-pay | Admitting: Family Medicine

## 2020-03-10 ENCOUNTER — Telehealth: Payer: Self-pay

## 2020-03-10 ENCOUNTER — Ambulatory Visit: Payer: Self-pay

## 2020-03-10 DIAGNOSIS — R0981 Nasal congestion: Secondary | ICD-10-CM | POA: Diagnosis not present

## 2020-03-10 DIAGNOSIS — Z20822 Contact with and (suspected) exposure to covid-19: Secondary | ICD-10-CM

## 2020-03-10 MED ORDER — AZITHROMYCIN 250 MG PO TABS: ORAL_TABLET | ORAL | 0 refills | Status: DC

## 2020-03-10 NOTE — Telephone Encounter (Signed)
Appointment scheduled today.

## 2020-03-10 NOTE — Telephone Encounter (Signed)
Copied from CRM [id number]. Topic: Appointment Scheduling - Scheduling Inquiry for Clinic >> Mar 10, 2020 11:17 AM Cuthrell, Pearlean Brownie wrote: Reason for CRM: Patient called in and stated he had been exposed to COVID on christmas, he is experiencing headaches and muscle pain, requesting a possible work in, or COVID test at the practice if possible.

## 2020-03-10 NOTE — Telephone Encounter (Signed)
Patient has virtual appt scheduled today with Dortha Kern, PAC

## 2020-03-10 NOTE — Telephone Encounter (Signed)
   DP    2        Jonathan Sharp Male, 69 y.o., 08/10/1950  MRN:  [id number] Phone:  [phone] Judie Petit)       PCP:  Malva Limes, MD Coverage:  [place] [employer] Medicare/[facility] Medicare  Next Appt With Family Medicine 06/29/2020 at 9:00 AM         Message from Milana Obey sent at 03/10/2020 11:16 AM EST  Summary: Covid exposure    Patient called in and stated he had been exposed to COVID on christmas, he is experiencing headaches and muscle pain, (sending CRM to practice to see if patient can be worked in and COVID testing)          Call History   Type Contact Phone/Fax User  03/10/2020 11:13 AM EST Phone (Incoming) Andros, Channing (Self) [phone] (W) Cuthrell, Zhania N   Pt. Had COVID 19 exposure Christmas at church. Today has muscle aches, headache and chest congestion. Virtual visit made per Rosey Bath in the practice.  Reason for Disposition . MILD difficulty breathing (e.g., minimal/no SOB at rest, SOB with walking, pulse <100)  Answer Assessment - Initial Assessment Questions 1. COVID-19 DIAGNOSIS: "Who made your COVID-19 diagnosis?" "Was it confirmed by a positive lab test?" If not diagnosed by a HCP, ask "Are there lots of cases (community spread) where you live?" Note: See public health department website, if unsure.     No 2. COVID-19 EXPOSURE: "Was there any known exposure to COVID before the symptoms began?" CDC Definition of close contact: within 6 feet (2 meters) for a total of 15 minutes or more over a 24-hour period.      No 3. ONSET: "When did the COVID-19 symptoms start?"      Yesterday 4. WORST SYMPTOM: "What is your worst symptom?" (e.g., cough, fever, shortness of breath, muscle aches)     Body ache, headache 5. COUGH: "Do you have a cough?" If Yes, ask: "How bad is the cough?"       Yes 6. FEVER: "Do you have a fever?" If Yes, ask: "What is your temperature, how was it measured, and when did it start?"     No 7. RESPIRATORY  STATUS: "Describe your breathing?" (e.g., shortness of breath, wheezing, unable to speak)      No 8. BETTER-SAME-WORSE: "Are you getting better, staying the same or getting worse compared to yesterday?"  If getting worse, ask, "In what way?"     Worse 9. HIGH RISK DISEASE: "Do you have any chronic medical problems?" (e.g., asthma, heart or lung disease, weak immune system, obesity, etc.)     CAD 10. VACCINE: "Have you gotten the COVID-19 vaccine?" If Yes ask: "Which one, how many shots, when did you get it?"       Yes - Moderna 11. PREGNANCY: "Is there any chance you are pregnant?" "When was your last menstrual period?"       n/a 12. OTHER SYMPTOMS: "Do you have any other symptoms?"  (e.g., chills, fatigue, headache, loss of smell or taste, muscle pain, sore throat; new loss of smell or taste especially support the diagnosis of COVID-19)       Body aches and headache  Protocols used: CORONAVIRUS (COVID-19) DIAGNOSED OR SUSPECTED-A-AH

## 2020-03-10 NOTE — Telephone Encounter (Signed)
Virtual visit scheduled.  

## 2020-03-10 NOTE — Progress Notes (Signed)
MyChart Video Visit    Virtual Visit via Video Note   This visit type was conducted due to national recommendations for restrictions regarding the COVID-19 Pandemic (e.g. social distancing) in an effort to limit this patient's exposure and mitigate transmission in our community. This patient is at least at moderate risk for complications without adequate follow up. This format is felt to be most appropriate for this patient at this time. Physical exam was limited by quality of the video and audio technology used for the visit.   Patient location: Home Provider location: Office  I discussed the limitations of evaluation and management by telemedicine and the availability of in person appointments. The patient expressed understanding and agreed to proceed.  Patient: Jonathan Sharp   DOB: 06-10-1950   69 y.o. Male  MRN: [id number] Visit Date: 03/10/2020  Today's healthcare provider: Dortha Kern, PA-C   No chief complaint on file.  Subjective    HPI  Upper Respiratory Infection: Patient complains of symptoms of a URI. Symptoms include congestion, cough and headache. Onset of symptoms was 1 day ago, gradually worsening since that time.  He is drinking plenty of fluids. Evaluation to date: none. Treatment to date: none.   03/04/20- Potentially exposed to covid 03/09/20- headache, fatigue, muscle aches, congestion, afebrile    Patient Active Problem List   Diagnosis Date Noted  . GERD (gastroesophageal reflux disease) 10/17/2018  . Hx of adenomatous colonic polyps 10/17/2018  . Myalgia due to statin 03/19/2018  . HTN (hypertension) 09/01/2012  . Hyperlipidemia 06/28/2008  . CAD, NATIVE VESSEL 06/28/2008  . NEPHROLITHIASIS, HX OF 06/28/2008   Past Medical History:  Diagnosis Date  . Coronary artery disease    a. s/p NSTEMI with DES to LAD 06/2008.  Marland Kitchen HTN (hypertension)   . Hyperlipidemia   . Kidney stones   . LV dysfunction    a. EF 40% at time of cath 06/2008, improved to  normal on f/u echo.   Family History  Problem Relation Age of Onset  . Heart attack Father 75  . Diabetes Brother        over weight   No Known Allergies    Medications: Outpatient Medications Prior to Visit  Medication Sig  . aspirin 81 MG tablet Take 1 tablet (81 mg total) by mouth daily.  . carvedilol (COREG) 6.25 MG tablet TAKE 1 TABLET BY MOUTH TWICE A DAY WITH A MEAL.  . famotidine (PEPCID) 20 MG tablet TAKE 1 TABLET BY MOUTH TWICE A DAY  . losartan (COZAAR) 50 MG tablet Take 1 tablet (50 mg total) by mouth 2 (two) times daily.  . nitroGLYCERIN (NITROSTAT) 0.4 MG SL tablet PLACE 1 TABLET UNDER THE TONGUE EVERY 5 MINUTES AS NEEDED FOR CHEST PAIN,* MAY REPEAT UP TO 3 TIMES  . simvastatin (ZOCOR) 20 MG tablet TAKE 1 TABLET BY MOUTH DAILY AT 6 PM. PLEASE KEEP UPCOMING APPT IN OCTOBER WITH DR. Clifton James FOR FUTURE REFILLS. THANK YOU  . tamsulosin (FLOMAX) 0.4 MG CAPS capsule TAKE 1 CAPSULE BY MOUTH EVERY EVENING   No facility-administered medications prior to visit.    Review of Systems  Constitutional: Positive for fatigue. Negative for fever.  HENT: Positive for congestion and postnasal drip. Negative for ear discharge, ear pain and sore throat.   Respiratory: Positive for cough and wheezing. Negative for apnea, choking, chest tightness, shortness of breath and stridor.       Objective    There were no vitals taken for this visit.  Physical Exam:WDWN male in no apparent distress.  Head: Normocephalic, atraumatic. Neck: Supple, NROM Respiratory: No apparent distress Psych: Normal mood and affect      Assessment & Plan     1. Sinus congestion Having headache under and round eyes with PND and slight cough over the past 2 days. May use Mucinex-DM and increase fluid intake. Treat with Z-pak and test for COVID. - azithromycin (ZITHROMAX) 250 MG tablet; Take 2 tablets by mouth today, then 1 daily for 4 days.  Dispense: 6 tablet; Refill: 0  2. 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 Will test today and recommend quarantine at home. - Coronavirus (COVID-19) with Influenza A and Influenza B   No follow-ups on file.     I discussed the assessment and treatment plan with the patient. The patient was provided an opportunity to ask questions and all were answered. The patient agreed with the plan and demonstrated an understanding of the instructions.   The patient was advised to call back or seek an in-person evaluation if the symptoms worsen or if the condition fails to improve as anticipated.  I provided 20 minutes of non-face-to-face time during this encounter.  I, Kolt Mcwhirter, PA-C, have reviewed all documentation for this visit. The documentation on 03/10/20 for the exam, diagnosis, procedures, and orders are all accurate and complete.   Vernie Murders, PA-C [employer] [phone] (phone) [phone] (fax)  [place]

## 2020-03-11 ENCOUNTER — Encounter: Payer: Self-pay | Admitting: Family Medicine

## 2020-03-12 LAB — COVID-19, FLU A+B NAA
Influenza A, NAA: NOT DETECTED
Influenza B, NAA: NOT DETECTED
SARS-CoV-2, NAA: NOT DETECTED

## 2020-03-17 ENCOUNTER — Telehealth: Payer: Medicare Other | Admitting: Family Medicine

## 2020-04-04 ENCOUNTER — Other Ambulatory Visit: Payer: Self-pay | Admitting: Cardiovascular Disease

## 2020-05-24 MED ORDER — NITROGLYCERIN 0.4 MG SL SUBL: 0.4000 mg | SUBLINGUAL_TABLET | SUBLINGUAL | 1 refills | Status: DC | PRN

## 2020-06-02 ENCOUNTER — Other Ambulatory Visit: Payer: Self-pay | Admitting: Cardiovascular Disease

## 2020-06-02 NOTE — Telephone Encounter (Signed)
Outpatient Medication Detail   Disp Refills Start End   nitroGLYCERIN (NITROSTAT) 0.4 MG SL tablet 75 tablet 1 05/24/2020    Sig - Route: Place 1 tablet (0.4 mg total) under the tongue every 5 (five) minutes as needed for chest pain. - Sublingual   Sent to pharmacy as: nitroGLYCERIN (NITROSTAT) 0.4 MG SL tablet   E-Prescribing Status: Receipt confirmed by pharmacy (05/24/2020  8:11 AM EDT)     Pharmacy  [id number] - [place], [place] - [place]

## 2020-06-29 ENCOUNTER — Other Ambulatory Visit: Payer: Self-pay

## 2020-06-29 ENCOUNTER — Ambulatory Visit (INDEPENDENT_AMBULATORY_CARE_PROVIDER_SITE_OTHER): Payer: Medicare Other | Admitting: Family Medicine

## 2020-06-29 VITALS — BP 144/80 | HR 51 | Ht 72.0 in | Wt 241.0 lb

## 2020-06-29 DIAGNOSIS — M79602 Pain in left arm: Secondary | ICD-10-CM | POA: Insufficient documentation

## 2020-06-29 DIAGNOSIS — E785 Hyperlipidemia, unspecified: Secondary | ICD-10-CM | POA: Diagnosis not present

## 2020-06-29 DIAGNOSIS — I10 Essential (primary) hypertension: Secondary | ICD-10-CM

## 2020-06-29 DIAGNOSIS — Z0000 Encounter for general adult medical examination without abnormal findings: Secondary | ICD-10-CM | POA: Diagnosis not present

## 2020-06-29 DIAGNOSIS — Z125 Encounter for screening for malignant neoplasm of prostate: Secondary | ICD-10-CM

## 2020-06-29 NOTE — Patient Instructions (Signed)
.   Please review the attached list of medications and notify my office if there are any errors.   . Please bring all of your medications to every appointment so we can make sure that our medication list is the same as yours  . Try OTC diclofenac (OTC Voltaren gel).to the sore area on your arm once or twice a day

## 2020-06-29 NOTE — Progress Notes (Signed)
Complete physical exam   Patient: Jonathan Sharp   DOB: April 25, 1950   70 y.o. Male  MRN: [id number] Visit Date: 06/29/2020  Today's healthcare provider: Lelon Huh, MD   Chief Complaint  Patient presents with  . Annual Exam   Subjective    Jonathan Sharp is a 70 y.o. male who presents today for a complete physical exam.  He reports consuming a general diet - "overeating". Patient walks and has strenuous job. He generally feels fairly well. He reports sleeping well. He does have additional problems to discuss today. Patient says he is still having soreness in left arm where he got his second Moderna Covid vaccine a year ago. He says the pain has gradually gotten worse. Patient says pain radiates up to left side of neck and down his left arm.   10/12/2019  COLONOSCOPY: The perianal and digital rectal examinations were normal.  HPI  Hypertension, follow-up  BP Readings from Last 3 Encounters:  06/29/20 (!) 170/84  02/29/20 108/68  01/04/20 130/82   Wt Readings from Last 3 Encounters:  06/29/20 241 lb (109.3 kg)  02/29/20 240 lb (108.9 kg)  01/04/20 234 lb 9.6 oz (106.4 kg)     He was last seen for hypertension 1 years ago.  BP at that visit was 157/84. Management since that visit includes none; continue current medications.  He reports excellent compliance with treatment. He is not having side effects.  He is following a Regular diet. He is exercising. He does not smoke.  Use of agents associated with hypertension: NSAIDS.   Outside blood pressures are 120s/80s.  Lipid/Cholesterol, Follow-up  Last lipid panel Other pertinent labs  Lab Results  Component Value Date   CHOL 122 06/30/2019   HDL 49 06/30/2019   LDLCALC 56 06/30/2019   TRIG 91 06/30/2019   CHOLHDL 2.5 06/30/2019   Lab Results  Component Value Date   ALT 29 06/30/2019   AST 29 06/30/2019   PLT 157 06/30/2019   TSH 4.670 (H) 06/30/2019     He was last seen for this 1 years ago.  Management  since that visit includes none; Continue current medications.  He reports excellent compliance with treatment. He is not having side effects.    Current diet: in general, a "healthy" diet   Current exercise: walking  The ASCVD Risk score Mikey Bussing DC Jr., et al., 2013) failed to calculate for the following reasons:   The valid total cholesterol range is 130 to 320 mg/dL  ---------------------------------------------------------------------------------------------------   Pertinent labs: Lab Results  Component Value Date   CHOL 122 06/30/2019   HDL 49 06/30/2019   LDLCALC 56 06/30/2019   TRIG 91 06/30/2019   CHOLHDL 2.5 06/30/2019   Lab Results  Component Value Date   NA 142 06/30/2019   K 4.1 06/30/2019   CREATININE 1.21 06/30/2019   GFRNONAA 60 06/30/2019   GFRAA 69 06/30/2019   GLUCOSE 99 06/30/2019     The ASCVD Risk score (Clayton., et al., 2013) failed to calculate for the following reasons:   The valid total cholesterol range is 130 to 320 mg/dL   ---------------------------------------------------------------------------------------------------   Past Medical History:  Diagnosis Date  . Coronary artery disease    a. s/p NSTEMI with DES to LAD 06/2008.  Marland Kitchen HTN (hypertension)   . Hyperlipidemia   . Kidney stones   . LV dysfunction    a. EF 40% at time of cath 06/2008, improved to normal on f/u  echo.   Past Surgical History:  Procedure Laterality Date  . CAROTID STENT  2010  . CATARACT EXTRACTION, BILATERAL    . COLONOSCOPY N/A 10/12/2019   Procedure: COLONOSCOPY;  Surgeon: Lesly Rubenstein, MD;  Location: [facility] ENDOSCOPY;  Service: Endoscopy;  Laterality: N/A;  . EYE SURGERY    . I & D EXTREMITY Right 10/13/2018   Procedure: IRRIGATION AND DEBRIDEMENT RIGHT HAND;  Surgeon: Leanora Cover, MD;  Location: [facility];  Service: Orthopedics;  Laterality: Right;  . INTRAOCULAR LENS INSERTION Right 10/22/2016   [facility]   Social History   Socioeconomic History  . Marital  status: Married    Spouse name: Not on file  . Number of children: 2  . Years of education: Not on file  . Highest education level: Some college, no degree  Occupational History  . Occupation: MAINTANCE SERVICE    Comment: retired    Comment: Owns store / Games developer  Tobacco Use  . Smoking status: Never Smoker  . Smokeless tobacco: Never Used  Vaping Use  . Vaping Use: Never used  Substance and Sexual Activity  . Alcohol use: Yes    Alcohol/week: 1.0 - 2.0 standard drink    Types: 1 - 2 Shots of liquor per week  . Drug use: No  . Sexual activity: Not on file  Other Topics Concern  . 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 Difficulty of Paying Living Expenses: Not hard at all  Food Insecurity: No Food Insecurity  . Worried About Charity fundraiser in the Last Year: Never true  . Ran Out of Food in the Last Year: Never true  Transportation Needs: No Transportation Needs  . Lack of Transportation (Medical): No  . Lack of Transportation (Non-Medical): No  Physical Activity: Insufficiently Active  . Days of Exercise per Week: 2 days  . Minutes of Exercise per Session: 70 min  Stress: No Stress Concern Present  . Feeling of Stress : Only a little  Social Connections: Socially Integrated  . Frequency of Communication with Friends and Family: Three times a week  . Frequency of Social Gatherings with Friends and Family: More than three times a week  . Attends Religious Services: More than 4 times per year  . Active Member of Clubs or Organizations: Yes  . Attends Archivist Meetings: More than 4 times per year  . Marital Status: Married  Human resources officer Violence: Not At Risk  . Fear of Current or Ex-Partner: No  . Emotionally Abused: No  . Physically  Abused: No  . Sexually Abused: No   Family Status  Relation Name Status  . Mother  Alive  . Father  Deceased  . Brother  Alive  . Brother  Alive  . MGM  Deceased  . MGF  Deceased  . PGM  Deceased  . PGF  Deceased  . Daughter  Alive  . Daughter  Alive   Family History  Problem Relation Age of Onset  . Heart attack Father 24  . Diabetes Brother        over weight   No Known Allergies  Patient Care Team: Birdie Sons, MD as PCP - General (Family Medicine) Burnell Blanks, MD  as PCP - Cardiology (Cardiology) Emmaline Kluver., MD as Consulting Physician (Rheumatology) Haig Prophet Hilton Cork, MD as Consulting Physician (Gastroenterology) System, Provider Not In   Medications: Outpatient Medications Prior to Visit  Medication Sig  . aspirin 81 MG tablet Take 1 tablet (81 mg total) by mouth daily.  . carvedilol (COREG) 6.25 MG tablet TAKE 1 TABLET BY MOUTH TWICE A DAY WITH A MEAL.  . famotidine (PEPCID) 20 MG tablet TAKE 1 TABLET BY MOUTH TWICE A DAY  . losartan (COZAAR) 50 MG tablet TAKE 1 TABLET BY MOUTH TWICE A DAY  . nitroGLYCERIN (NITROSTAT) 0.4 MG SL tablet Place 1 tablet (0.4 mg total) under the tongue every 5 (five) minutes as needed for chest pain.  . simvastatin (ZOCOR) 20 MG tablet TAKE 1 TABLET BY MOUTH DAILY AT 6 PM. PLEASE KEEP UPCOMING APPT IN OCTOBER WITH DR. Angelena Form FOR FUTURE REFILLS. THANK YOU  . tamsulosin (FLOMAX) 0.4 MG CAPS capsule TAKE 1 CAPSULE BY MOUTH EVERY EVENING  . azithromycin (ZITHROMAX) 250 MG tablet Take 2 tablets by mouth today, then 1 daily for 4 days. (Patient not taking: Reported on 06/29/2020)   No facility-administered medications prior to visit.    Review of Systems  HENT: Positive for tinnitus.   Musculoskeletal: Positive for arthralgias, myalgias and neck stiffness.      Objective    BP (!) 144/80   Pulse (!) 51   Ht 6' (1.829 m)   Wt 241 lb (109.3 kg)   SpO2 100%   BMI 32.69 kg/m    Physical Exam    General Appearance:    Mildly obese male. Alert, cooperative, in no acute distress, appears stated age  Head:    Normocephalic, without obvious abnormality, atraumatic  Eyes:    PERRL, conjunctiva/corneas clear, EOM's intact, fundi    benign, both eyes       Ears:    Normal TM's and external ear canals, both ears  Nose:   Nares normal, septum midline, mucosa normal, no drainage   or sinus tenderness  Back:     Symmetric, no curvature, ROM normal, no CVA tenderness  Lungs:     Clear to auscultation bilaterally, respirations unlabored  Chest wall:    No tenderness or deformity  Heart:    Bradycardic. Normal rhythm. No murmurs, rubs, or gallops.  S1 and S2 normal  Abdomen:     Soft, non-tender, bowel sounds active all four quadrants,    no masses, no organomegaly  Genitalia:    deferred  Rectal:    deferred  Extremities:   All extremities are intact. No cyanosis or edema  Pulses:   2+ and symmetric all extremities  Skin:   Skin color, texture, turgor normal, no rashes or lesions  Lymph nodes:   Cervical, supraclavicular, and axillary nodes normal  Neurologic:   CNII-XII intact. Normal strength, sensation and reflexes      throughout     Last depression screening scores PHQ 2/9 Scores 06/29/2020 02/29/2020 06/29/2019  PHQ - 2 Score 0 0 0  PHQ- 9 Score 0 - -   Last fall risk screening Fall Risk  06/29/2020  Falls in the past year? 0  Number falls in past yr: 0  Injury with Fall? 0   Last Audit-C alcohol use screening Alcohol Use Disorder Test (AUDIT) 06/29/2020  1. How often do you have a drink containing alcohol? 3  2. How many drinks containing alcohol do you have on a typical day when you are  drinking? 0  3. How often do you have six or more drinks on one occasion? 0  AUDIT-C Score 3  4. How often during the last year have you found that you were not able to stop drinking once you had started? 0  5. How often during the last year have you failed to do what was normally expected  from you because of drinking? 0  6. How often during the last year have you needed a first drink in the morning to get yourself going after a heavy drinking session? 0  7. How often during the last year have you had a feeling of guilt of remorse after drinking? 0  8. How often during the last year have you been unable to remember what happened the night before because you had been drinking? 0  9. Have you or someone else been injured as a result of your drinking? 0  10. Has a relative or friend or a doctor or another health worker been concerned about your drinking or suggested you cut down? 0  Alcohol Use Disorder Identification Test Final Score (AUDIT) 3  Alcohol Brief Interventions/Follow-up -   A score of 3 or more in women, and 4 or more in men indicates increased risk for alcohol abuse, EXCEPT if all of the points are from question 1     Assessment & Plan    Routine Health Maintenance and Physical Exam  Exercise Activities and Dietary recommendations Goals    . Increase water intake     Starting 02/01/16, I will increase my water intake to 6-8 8 oz glasses a day.       Immunization History  Administered Date(s) Administered  . Fluad Quad(high Dose 65+) 12/19/2018  . Influenza-Unspecified 12/20/2015, 12/21/2016, 01/06/2018, 12/11/2019  . Moderna Sars-Covid-2 Vaccination 04/24/2019, 05/20/2019, 01/21/2020  . Pneumococcal Conjugate-13 02/01/2016  . Pneumococcal Polysaccharide-23 01/24/2017  . Tdap 07/18/2007, 10/13/2018  . Zoster Recombinat (Shingrix) 05/04/2018, 12/19/2018    Health Maintenance  Topic Date Due  . INFLUENZA VACCINE  10/10/2020  . TETANUS/TDAP  10/12/2028  . COLONOSCOPY (Pts 45-73yrs Insurance coverage will need to be confirmed)  10/11/2029  . COVID-19 Vaccine  Completed  . Hepatitis C Screening  Completed  . PNA vac Low Risk Adult  Completed  . HPV VACCINES  Aged Out    Discussed health benefits of physical activity, and encouraged him to engage in  regular exercise appropriate for his age and condition.  1. Annual physical exam   2. Prostate cancer screening  - PSA Total (Reflex To Free) ([facility] only)  3. Primary hypertension He reports very good home blood pressures with brachial cuff. Doing well on current medication regiment. Continue current medications.   - TSH  4. Hyperlipidemia, unspecified hyperlipidemia type He is tolerating simvastatin well with no adverse effects.   - Comprehensive metabolic panel - CBC - Lipid panel  5. Left arm pain Started after Moderna vaccine #2. Had booster in right arm with no persistent effects. If this was vaccine related, it was likely due to soft tissue trauma from the needle rather than the vaccine contents. He can try OTC NSAID gel like Voltaren, but if it becomes more disabling would consider orthopedic referral.       The entirety of the information documented in the History of Present Illness, Review of Systems and Physical Exam were personally obtained by me. Portions of this information were initially documented by the CMA and reviewed by me for thoroughness and accuracy.  Lelon Huh, MD  [facility] [phone] (phone) [phone] (fax)  [place]

## 2020-06-30 LAB — COMPREHENSIVE METABOLIC PANEL
ALT: 35 IU/L (ref 0–44)
AST: 27 IU/L (ref 0–40)
Albumin/Globulin Ratio: 2.3 — ABNORMAL HIGH (ref 1.2–2.2)
Albumin: 4.5 g/dL (ref 3.7–4.7)
Alkaline Phosphatase: 75 IU/L (ref 44–121)
BUN/Creatinine Ratio: 13 (ref 10–24)
BUN: 18 mg/dL (ref 8–27)
Bilirubin Total: 0.6 mg/dL (ref 0.0–1.2)
CO2: 21 mmol/L (ref 20–29)
Calcium: 9.3 mg/dL (ref 8.6–10.2)
Chloride: 103 mmol/L (ref 96–106)
Creatinine, Ser: 1.35 mg/dL — ABNORMAL HIGH (ref 0.76–1.27)
Globulin, Total: 2 g/dL (ref 1.5–4.5)
Glucose: 93 mg/dL (ref 65–99)
Potassium: 4.8 mmol/L (ref 3.5–5.2)
Sodium: 140 mmol/L (ref 134–144)
Total Protein: 6.5 g/dL (ref 6.0–8.5)
eGFR: 56 mL/min/{1.73_m2} — ABNORMAL LOW (ref >59)

## 2020-06-30 LAB — CBC
Hematocrit: 44.6 % (ref 37.5–51.0)
Hemoglobin: 14.9 g/dL (ref 13.0–17.7)
MCH: 32.4 pg (ref 26.6–33.0)
MCHC: 33.4 g/dL (ref 31.5–35.7)
MCV: 97 fL (ref 79–97)
Platelets: 167 10*3/uL (ref 150–450)
RBC: 4.6 x10E6/uL (ref 4.14–5.80)
RDW: 12.7 % (ref 11.6–15.4)
WBC: 4.6 10*3/uL (ref 3.4–10.8)

## 2020-06-30 LAB — LIPID PANEL
Chol/HDL Ratio: 2.8 ratio (ref 0.0–5.0)
Cholesterol, Total: 132 mg/dL (ref 100–199)
HDL: 48 mg/dL (ref >39)
LDL Chol Calc (NIH): 64 mg/dL (ref 0–99)
Triglycerides: 109 mg/dL (ref 0–149)
VLDL Cholesterol Cal: 20 mg/dL (ref 5–40)

## 2020-06-30 LAB — PSA TOTAL (REFLEX TO FREE): Prostate Specific Ag, Serum: 0.6 ng/mL (ref 0.0–4.0)

## 2020-06-30 LAB — TSH: TSH: 4.49 u[IU]/mL (ref 0.450–4.500)

## 2020-07-12 ENCOUNTER — Emergency Department: Payer: Medicare Other

## 2020-07-12 ENCOUNTER — Encounter: Payer: Self-pay | Admitting: Emergency Medicine

## 2020-07-12 ENCOUNTER — Other Ambulatory Visit: Payer: Self-pay

## 2020-07-12 ENCOUNTER — Emergency Department
Admission: EM | Admit: 2020-07-12 | Discharge: 2020-07-12 | Disposition: A | Discharge status: Home / Self Care | Payer: Medicare Other | Attending: Emergency Medicine | Admitting: Emergency Medicine

## 2020-07-12 DIAGNOSIS — Y92480 Sidewalk as the place of occurrence of the external cause: Secondary | ICD-10-CM | POA: Diagnosis not present

## 2020-07-12 DIAGNOSIS — Z79899 Other long term (current) drug therapy: Secondary | ICD-10-CM | POA: Diagnosis not present

## 2020-07-12 DIAGNOSIS — I2510 Atherosclerotic heart disease of native coronary artery without angina pectoris: Secondary | ICD-10-CM | POA: Diagnosis not present

## 2020-07-12 DIAGNOSIS — W172XXA Fall into hole, initial encounter: Secondary | ICD-10-CM | POA: Insufficient documentation

## 2020-07-12 DIAGNOSIS — I10 Essential (primary) hypertension: Secondary | ICD-10-CM | POA: Insufficient documentation

## 2020-07-12 DIAGNOSIS — S20211A Contusion of right front wall of thorax, initial encounter: Secondary | ICD-10-CM | POA: Insufficient documentation

## 2020-07-12 DIAGNOSIS — I700 Atherosclerosis of aorta: Secondary | ICD-10-CM | POA: Diagnosis not present

## 2020-07-12 DIAGNOSIS — S299XXA Unspecified injury of thorax, initial encounter: Secondary | ICD-10-CM | POA: Diagnosis not present

## 2020-07-12 DIAGNOSIS — Z7982 Long term (current) use of aspirin: Secondary | ICD-10-CM | POA: Diagnosis not present

## 2020-07-12 DIAGNOSIS — J9811 Atelectasis: Secondary | ICD-10-CM | POA: Diagnosis not present

## 2020-07-12 MED ORDER — HYDROCODONE-ACETAMINOPHEN 5-325 MG PO TABS
1.0000 | ORAL_TABLET | Freq: Once | ORAL | Status: AC
  Administered 2020-07-12: 1 via ORAL
  Filled 2020-07-12: qty 1

## 2020-07-12 MED ORDER — HYDROCODONE-ACETAMINOPHEN 5-325 MG PO TABS: 1.0000 | ORAL_TABLET | Freq: Four times a day (QID) | ORAL | 0 refills | Status: DC | PRN

## 2020-07-12 NOTE — ED Triage Notes (Signed)
Pt comes with c/o right sided rib pain following a trip and fall. Pt states he stepped off the curb into a hole.

## 2020-07-12 NOTE — ED Provider Notes (Signed)
[facility] Emergency Department Provider Note  ____________________________________________   Event Date/Time   First MD Initiated Contact with Patient 07/12/20 [id number]     (approximate)  I have reviewed the triage vital signs and the nursing notes.   HISTORY  Chief Complaint Fall   HPI Jonathan Sharp is a 70 y.o. male presents to the ED with complaint of right-sided rib pain.  Patient states that he stepped off a curb and fell into a hole hitting the right side of his chest.  He denies any head injury or loss of consciousness.  He states this is the only area that was injured.  He rates his pain as an 8 out of 10.       Past Medical History:  Diagnosis Date  . Coronary artery disease    a. s/p NSTEMI with DES to LAD 06/2008.  Marland Kitchen HTN (hypertension)   . Hyperlipidemia   . Kidney stones   . LV dysfunction    a. EF 40% at time of cath 06/2008, improved to normal on f/u echo.    Patient Active Problem List   Diagnosis Date Noted  . Left arm pain 06/29/2020  . GERD (gastroesophageal reflux disease) 10/17/2018  . Hx of adenomatous colonic polyps 10/17/2018  . Myalgia due to statin 03/19/2018  . HTN (hypertension) 09/01/2012  . Hyperlipidemia 06/28/2008  . CAD, NATIVE VESSEL 06/28/2008  . NEPHROLITHIASIS, HX OF 06/28/2008    Past Surgical History:  Procedure Laterality Date  . CAROTID STENT  2010  . CATARACT EXTRACTION, BILATERAL    . COLONOSCOPY N/A 10/12/2019   Procedure: COLONOSCOPY;  Surgeon: Lesly Rubenstein, MD;  Location: [facility] ENDOSCOPY;  Service: Endoscopy;  Laterality: N/A;  . EYE SURGERY    . I & D EXTREMITY Right 10/13/2018   Procedure: IRRIGATION AND DEBRIDEMENT RIGHT HAND;  Surgeon: Leanora Cover, MD;  Location: [facility] [place];  Service: Orthopedics;  Laterality: Right;  . INTRAOCULAR LENS INSERTION Right 10/22/2016   [facility]    Prior to Admission medications   Medication Sig Start Date End Date Taking? Authorizing Provider   HYDROcodone-acetaminophen (NORCO/VICODIN) 5-325 MG tablet Take 1 tablet by mouth every 6 (six) hours as needed for moderate pain. 07/12/20 07/12/21 Yes Johnn Hai, PA-C  aspirin 81 MG tablet Take 1 tablet (81 mg total) by mouth daily. 04/16/12   Burnell Blanks, MD  carvedilol (COREG) 6.25 MG tablet TAKE 1 TABLET BY MOUTH TWICE A DAY WITH A MEAL. 01/12/20   Burnell Blanks, MD  famotidine (PEPCID) 20 MG tablet TAKE 1 TABLET BY MOUTH TWICE A DAY 01/15/20   Burnell Blanks, MD  losartan (COZAAR) 50 MG tablet TAKE 1 TABLET BY MOUTH TWICE A DAY 04/04/20   Burnell Blanks, MD  nitroGLYCERIN (NITROSTAT) 0.4 MG SL tablet Place 1 tablet (0.4 mg total) under the tongue every 5 (five) minutes as needed for chest pain. 05/24/20   Burnell Blanks, MD  simvastatin (ZOCOR) 20 MG tablet TAKE 1 TABLET BY MOUTH DAILY AT 6 PM. PLEASE KEEP UPCOMING APPT IN OCTOBER WITH DR. Angelena Form FOR FUTURE REFILLS. THANK YOU 02/22/20   Burnell Blanks, MD  tamsulosin (FLOMAX) 0.4 MG CAPS capsule TAKE 1 CAPSULE BY MOUTH EVERY EVENING 12/16/19   Birdie Sons, MD    Allergies Patient has no known allergies.  Family History  Problem Relation Age of Onset  . Heart attack Father 21  . Diabetes Brother        over  weight    Social History Social History   Tobacco Use  . Smoking status: Never Smoker  . Smokeless tobacco: Never Used  Vaping Use  . Vaping Use: Never used  Substance Use Topics  . Alcohol use: Yes    Alcohol/week: 1.0 - 2.0 standard drink    Types: 1 - 2 Shots of liquor per week  . Drug use: No    Review of Systems Constitutional: No fever/chills Eyes: No visual changes. ENT: No trauma. Cardiovascular: Denies chest pain. Respiratory: Denies shortness of breath. Gastrointestinal: No abdominal pain.  No nausea, no vomiting. Musculoskeletal: Positive for right rib pain. Skin: Negative for rash. Neurological: Negative for headaches, focal weakness or  numbness. ____________________________________________   PHYSICAL EXAM:  VITAL SIGNS: ED Triage Vitals  Enc Vitals Group     BP 07/12/20 0925 (!) 173/93     Pulse Rate 07/12/20 0925 (!) 59     Resp 07/12/20 0925 17     Temp 07/12/20 0925 97.6 F (36.4 C)     Temp src --      SpO2 07/12/20 0925 96 %     Weight 07/12/20 0926 238 lb (108 kg)     Height 07/12/20 0926 6\' 1"  (1.854 m)     Head Circumference --      Peak Flow --      Pain Score 07/12/20 0923 8     Pain Loc --      Pain Edu? --      Excl. in [place]? --     Constitutional: Alert and oriented. Well appearing and in no acute distress. Eyes: Conjunctivae are normal. PERRL. EOMI. Head: Atraumatic. Nose: No congestion/rhinnorhea. Neck: No stridor.  Nontender cervical spine to palpation posteriorly. Cardiovascular: Normal rate, regular rhythm. Grossly normal heart sounds.  Good peripheral circulation. Respiratory: Normal respiratory effort.  No retractions. Lungs CTAB.  Tender right seventh and eighth rib area without abrasion or discoloration. Gastrointestinal: Soft and nontender.   Musculoskeletal: Patient is able to move upper and lower extremities without any difficulty.  Patient is ambulatory with the aid of a cane. Neurologic:  Normal speech and language. No gross focal neurologic deficits are appreciated.  Skin:  Skin is warm, dry and intact. No rash noted. Psychiatric: Mood and affect are normal. Speech and behavior are normal.  ____________________________________________   LABS (all labs ordered are listed, but only abnormal results are displayed)  Labs Reviewed - No data to display ____________________________________________   RADIOLOGY I, Johnn Hai, personally viewed and evaluated these images (plain radiographs) as part of my medical decision making, as well as reviewing the written report by the radiologist.   Official radiology report(s): DG Ribs Unilateral W/Chest Right  Result Date:  07/12/2020 CLINICAL DATA:  Pain, injury, fall, anterior RIGHT rib pain, stepped off a curb into a hole. History coronary artery disease hypertension EXAM: RIGHT RIBS AND CHEST - 3+ VIEW COMPARISON:  Chest radiograph 08/31/2012 FINDINGS: Normal heart size post coronary stenting. Mediastinal contours and pulmonary vascularity normal. Atherosclerotic calcification aorta. Minimal bibasilar atelectasis. Lungs otherwise clear. No acute infiltrate, pleural effusion, or pneumothorax. Osseous demineralization. BB placed at site of symptoms lower anterior RIGHT ribs. No rib fracture or bone destruction. IMPRESSION: No acute rib abnormalities. Minimal bibasilar atelectasis. Electronically Signed   By: Lavonia Dana M.D.   On: 07/12/2020 10:49    ____________________________________________   PROCEDURES  Procedure(s) performed (including Critical Care):  Procedures   ____________________________________________   INITIAL IMPRESSION / ASSESSMENT AND PLAN /  ED COURSE  As part of my medical decision making, I reviewed the following data within the electronic MEDICAL RECORD NUMBER Notes from prior ED visits and [place] Controlled Substance Database  70 year old male presents to the ED with complaint of right rib pain after a fall in which she stepped off a curb and fell into the grate.  He denies any head injury or loss of consciousness.  He has continued to have right rib pain especially if he takes a deep breath.  He denies any other injuries.  X-rays of his right rib were negative for fracture.  Patient was given hydrocodone while in the ED for pain along with a prescription for the same.  He is aware that this may cause drowsiness and increase his risk for falling.  Patient was also told to hold a pillow over his ribs anytime he takes a deep breath or feels as if he is going to cough.  Patient is to follow-up with his PCP if any continued problems.  ____________________________________________   FINAL CLINICAL  IMPRESSION(S) / ED DIAGNOSES  Final diagnoses:  Contusion of rib, right, initial encounter     ED Discharge Orders         Ordered    HYDROcodone-acetaminophen (NORCO/VICODIN) 5-325 MG tablet  Every 6 hours PRN        07/12/20 1115          *Please note:  Jonathan Sharp was evaluated in Emergency Department on 07/12/2020 for the symptoms described in the history of present illness. He was evaluated in the context of the global COVID-19 pandemic, which necessitated consideration that the patient might be at risk for infection with the SARS-CoV-2 virus that causes COVID-19. Institutional protocols and algorithms that pertain to the evaluation of patients at risk for COVID-19 are in a state of rapid change based on information released by regulatory bodies including the CDC and federal and state organizations. These policies and algorithms were followed during the patient's care in the ED.  Some ED evaluations and interventions may be delayed as a result of limited staffing during and the pandemic.*   Note:  This document was prepared using Dragon voice recognition software and may include unintentional dictation errors.    Johnn Hai, PA-C 07/12/20 1405    Harvest Dark, MD 07/12/20 1450

## 2020-07-12 NOTE — Discharge Instructions (Addendum)
Follow-up with your primary care provider if any continued problems or concerns.  A prescription for pain medication was sent to your pharmacy to take as needed for moderate to severe pain.  This also contains Tylenol so there is no need to take additional Tylenol with your medication.  You may also use a pillow and hold against your ribs if you are coughing or taking deep breaths which will help with pain.  You can expect to be sore and tender for 2 to 3 weeks even with medication.

## 2020-09-14 ENCOUNTER — Other Ambulatory Visit: Payer: Self-pay | Admitting: Family Medicine

## 2020-09-14 DIAGNOSIS — N401 Enlarged prostate with lower urinary tract symptoms: Secondary | ICD-10-CM

## 2020-12-01 DIAGNOSIS — H5203 Hypermetropia, bilateral: Secondary | ICD-10-CM | POA: Diagnosis not present

## 2020-12-01 DIAGNOSIS — H40023 Open angle with borderline findings, high risk, bilateral: Secondary | ICD-10-CM | POA: Diagnosis not present

## 2021-01-02 DIAGNOSIS — Z961 Presence of intraocular lens: Secondary | ICD-10-CM | POA: Diagnosis not present

## 2021-01-02 DIAGNOSIS — H53002 Unspecified amblyopia, left eye: Secondary | ICD-10-CM | POA: Diagnosis not present

## 2021-01-02 DIAGNOSIS — H26492 Other secondary cataract, left eye: Secondary | ICD-10-CM | POA: Diagnosis not present

## 2021-01-02 DIAGNOSIS — H3530 Unspecified macular degeneration: Secondary | ICD-10-CM | POA: Diagnosis not present

## 2021-01-04 ENCOUNTER — Other Ambulatory Visit: Payer: Self-pay | Admitting: Cardiovascular Disease

## 2021-01-04 DIAGNOSIS — I2510 Atherosclerotic heart disease of native coronary artery without angina pectoris: Secondary | ICD-10-CM

## 2021-01-04 DIAGNOSIS — I10 Essential (primary) hypertension: Secondary | ICD-10-CM

## 2021-01-06 ENCOUNTER — Other Ambulatory Visit: Payer: Self-pay | Admitting: Cardiovascular Disease

## 2021-01-17 NOTE — Progress Notes (Addendum)
Cardiology Office Note    Date:  01/20/2021   ID:  Jonathan Sharp, DOB 1950/09/15, MRN [id number]  PCP:  Birdie Sons, MD  Cardiologist:  Lauree Chandler, MD  Electrophysiologist:  None   Chief Complaint: f/u CAD  History of Present Illness:   Jonathan Sharp is a 70 y.o. male with history of CAD s/p NSTEMI 06/2008 s/p stenting to LAD, prior ischemic cardiomyopathy with resolution, CKD stage 3a, mildly dilated aortic root, baseline sinus bradycardia and first degree AVB, HTN, and HL who presents for follow-up. He had remote MI as above with EF 40% at time of MI improved to normal by f/u echo. Remote stress test in 2014 showed no definite inducible ischemia with exercise stress. Last study was via 2D echo 12/2017 EF 55-60%, normal RV, grade 1 DD, mildly dilated aortic root, no significant valvular abnormalities. He had lower extremity edema with 10mg  of Norvasc and did not tolerate high dose statin therapy - Zocor previously lowered due to muscle aches. He owns an Customer service manager.  He is seen back for follow-up and has been feeling great. He denies any CP, SOB, palpitations, syncope. No significant edema on exam. He has been trending his BP and starting to see it creep up at home. It was 120/80s previously but more recently around 341-937 systolic at home in the evening. Did not check in the AM. It was 140/90 on today's exam, recheck by me confirmed elevated (170/102). He did already take his medicine this AM about 3 hours ago. He walks 4 miles every other day. He was aware of abnormal kidney function. Has not seen nephrology before. Reports 30lb intentional weight loss over the last year.   Labwork independently reviewed: 06/2020 TSH wnl, LDL 64, CBC wnl, K 4.8, Cr 1.35, normal LFTs otherwise   Past Medical History:  Diagnosis Date   Chronic kidney disease, stage 3a ([place])    Coronary artery disease    a. s/p NSTEMI with DES to LAD 06/2008.   First degree AV block    HTN (hypertension)     Hyperlipidemia    Kidney stones    LV dysfunction    a. EF 40% at time of cath 06/2008, improved to normal on f/u echo.   Sinus bradycardia     Past Surgical History:  Procedure Laterality Date   CAROTID STENT  2010   CATARACT EXTRACTION, BILATERAL     COLONOSCOPY N/A 10/12/2019   Procedure: COLONOSCOPY;  Surgeon: Lesly Rubenstein, MD;  Location: [facility] ENDOSCOPY;  Service: Endoscopy;  Laterality: N/A;   EYE SURGERY     I & D EXTREMITY Right 10/13/2018   Procedure: IRRIGATION AND DEBRIDEMENT RIGHT HAND;  Surgeon: Leanora Cover, MD;  Location: [facility];  Service: Orthopedics;  Laterality: Right;   INTRAOCULAR LENS INSERTION Right 10/22/2016   [facility]    Current Medications: Current Meds  Medication Sig   aspirin 81 MG tablet Take 1 tablet (81 mg total) by mouth daily.   carvedilol (COREG) 6.25 MG tablet TAKE 1 TABLET BY MOUTH TWICE A DAY WITH MEALS   famotidine (PEPCID) 20 MG tablet TAKE 1 TABLET BY MOUTH TWICE A DAY   losartan (COZAAR) 50 MG tablet TAKE 1 TABLET BY MOUTH TWICE A DAY   nitroGLYCERIN (NITROSTAT) 0.4 MG SL tablet Place 1 tablet (0.4 mg total) under the tongue every 5 (five) minutes as needed for chest pain.   simvastatin (ZOCOR) 20 MG tablet TAKE 1 TABLET BY MOUTH DAILY AT 6  PM. PLEASE KEEP UPCOMING APPT IN OCTOBER WITH DR. McMullin FUTURE REFILLS. THANK YOU   tamsulosin (FLOMAX) 0.4 MG CAPS capsule Take 1 capsule (0.4 mg total) by mouth every evening.      Allergies:   Patient has no known allergies.   Social History   Socioeconomic History   Marital status: Married    Spouse name: Not on file   Number of children: 2   Years of education: Not on file   Highest education level: Some college, no degree  Occupational History   Occupation: MAINTANCE SERVICE    Comment: retired    Comment: Owns store / Games developer  Tobacco Use   Smoking status: Never   Smokeless tobacco: Never  Vaping Use   Vaping Use: Never used  Substance and Sexual Activity   Alcohol use:  Yes    Alcohol/week: 1.0 - 2.0 standard drink    Types: 1 - 2 Shots of liquor per week   Drug use: No   Sexual activity: Not on file  Other Topics Concern   Not on file  Social History Narrative   He lives in [place] with his wife.2 children   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   Occasional alcohol   No drugs   Social Determinants of Radio broadcast assistant Strain: Low Risk    Difficulty of Paying Living Expenses: Not hard at all  Food Insecurity: No Food Insecurity   Worried About Charity fundraiser in the Last Year: Never true   Arboriculturist in the Last Year: Never true  Transportation Needs: No Transportation Needs   Lack of Transportation (Medical): No   Lack of Transportation (Non-Medical): No  Physical Activity: Insufficiently Active   Days of Exercise per Week: 2 days   Minutes of Exercise per Session: 70 min  Stress: No Stress Concern Present   Feeling of Stress : Only a little  Social Connections: Engineer, building services of Communication with Friends and Family: Three times a week   Frequency of Social Gatherings with Friends and Family: More than three times a week   Attends Religious Services: More than 4 times per year   Active Member of [employer] or Organizations: Yes   Attends Music therapist: More than 4 times per year   Marital Status: Married     Family History:  The patient's family history includes Diabetes in his brother; Heart attack (age of onset: 56) in his father.  ROS:   Please see the history of present illness.  All other systems are reviewed and otherwise negative.    EKGs/Labs/Other Studies Reviewed:    Studies reviewed are outlined and summarized above. Reports included below if pertinent.  2D echo 12/2017  - Left ventricle: The cavity size was normal. Wall thickness was    normal. Systolic function was normal. The estimated ejection    fraction was in the range of 55% to 60%. Wall  motion was normal;    there were no regional wall motion abnormalities. Doppler    parameters are consistent with abnormal left ventricular    relaxation (grade 1 diastolic dysfunction).  - Aortic valve: There was trivial regurgitation.  - Aorta: Mildly dilated aortic root. Aortic root dimension: 39 mm    (ED).  - Mitral valve: There was trivial regurgitation.  - Right ventricle: The cavity size was normal. Systolic function    was normal.  - Tricuspid valve: Peak  RV-RA gradient (S): 18 mm Hg.  - Pulmonary arteries: PA peak pressure: 21 mm Hg (S).  - Inferior vena cava: The vessel was normal in size. The    respirophasic diameter changes were in the normal range (= 50%),    consistent with normal central venous pressure.   Impressions:   - Normal LV size with EF 55-60%. Normal RV size and systolic    function. No significant valvular abnormalities.   Nuc 2014 Findings:   Spect:  Small anterior septal fixed defect on the short axis views  predominantly.  Suspect attenuation artifact versus a small area of  scarring.  No definite inducible or reversible ischemia with  exercise stress.   Wall motion:  Grossly normal wall motion.   Ejection fraction:  Calculated Q G S ejection fraction is 55%.   IMPRESSION:  No definite inducible ischemia with exercise stress.     EKG:  EKG is ordered today, personally reviewed, demonstrating SB 50bpm, borderline 1st degree AVB 262ms no significant STT changes  Recent Labs: 06/29/2020: ALT 35; BUN 18; Creatinine, Ser 1.35; Hemoglobin 14.9; Platelets 167; Potassium 4.8; Sodium 140; TSH 4.490  Recent Lipid Panel    Component Value Date/Time   CHOL 132 06/29/2020 0958   TRIG 109 06/29/2020 0958   HDL 48 06/29/2020 0958   CHOLHDL 2.8 06/29/2020 0958   CHOLHDL 2.3 01/25/2017 0801   VLDL 29 12/14/2015 0827   LDLCALC 64 06/29/2020 0958   LDLCALC 50 01/25/2017 0801    PHYSICAL EXAM:    VS:  BP 140/90   Pulse (!) 50   Ht 6' 1.5" (1.867 m)    Wt 219 lb 6.4 oz (99.5 kg)   SpO2 95%   BMI 28.55 kg/m   BMI: Body mass index is 28.55 kg/m.  GEN: Well nourished, well developed male in no acute distress HEENT: normocephalic, atraumatic Neck: no JVD, carotid bruits, or masses Cardiac: RRR; no murmurs, rubs, or gallops, no edema  Respiratory:  clear to auscultation bilaterally, normal work of breathing GI: soft, nontender, nondistended, + BS MS: no deformity or atrophy Skin: warm and dry, no rash Neuro:  Alert and Oriented x 3, Strength and sensation are intact, follows commands Psych: euthymic mood, full affect  Wt Readings from Last 3 Encounters:  01/20/21 219 lb 6.4 oz (99.5 kg)  07/12/20 238 lb (108 kg)  06/29/20 241 lb (109.3 kg)     ASSESSMENT & PLAN:   1. CAD s/p prior MI/PCI with HLD goal LDL <70 - doing well without angina. Continue ASA, BB, statin. HR tends to run low 50s but is stable compared to prior. Last lipids reviewed and LDL at goal - he is on current dose of simvastatin due to myalgias. Also need to be cognizant of addition of amlodipine as below - max recommended dose of simvastatin while on this medicine is 20mg , so we cannot increase further at the time - in the future, if he requires further lipid titration, would consider switch to atorvastation.   2. Hx of ischemic cardiomyopathy - limited to time of MI with eventual normalization of EF. Obtaining echo as below for dilated aortic root - will be able to evaluate stability of LVEF at that time.  3. Essential HTN - Suboptimal blood pressure control noted today. We will try to add back lower dose amlodipine at 5mg  daily (previously had edema at 10mg  dose). Check TSH, CMET (to include albumin, creatinine), and UA today (to evaluate for proteinuria). The patient was provided  instructions on monitoring blood pressure at home for 1 week and relaying results to our office. We discussed follow-up plan and offered for him to either f/u with [place] or PCP for further med  titration as needed. He prefers the latter because it's closer for him, so he will call today to make that appointment (advised f/u in 2 weeks). Will continue carvedilol and losartan at current doses. Cannot titrate carvedilol further due to baseline HR.   4. Dilated aortic root - recheck echocardiogram to evaluate. This modality was chosen over CT angio due to his CKD.  5. CKD stage 3a - recheck labs today as above. Discussed avoidance of NSAIDS - does not presently use, but used to. Will refer to nephrology to establish care as well.  6. Sinus bradycardia, first degree AVB - HR low 50s today, asymptomatic. Last OV 12/2019 he was 51bpm so this is stable. Since he is feeling great we will continue carvedilol at current dose but do not titrate further. Continue to monitor HR and symptoms at each OV.     Disposition: F/u with Dr. Angelena Form in 1 year.   Medication Adjustments/Labs and Tests Ordered: Current medicines are reviewed at length with the patient today.  Concerns regarding medicines are outlined above. Medication changes, Labs and Tests ordered today are summarized above and listed in the Patient Instructions accessible in Encounters.   Signed, Charlie Pitter, PA-C  01/20/2021 9:18 AM    [facility] [address], [place], [place]  [address] Phone: [phone]; Fax: [phone]

## 2021-01-20 ENCOUNTER — Ambulatory Visit: Payer: Medicare Other | Admitting: Physician Assistant

## 2021-01-20 ENCOUNTER — Other Ambulatory Visit: Payer: Self-pay

## 2021-01-20 ENCOUNTER — Encounter: Payer: Self-pay | Admitting: Physician Assistant

## 2021-01-20 VITALS — BP 140/90 | HR 50 | Ht 73.5 in | Wt 219.4 lb

## 2021-01-20 DIAGNOSIS — R001 Bradycardia, unspecified: Secondary | ICD-10-CM | POA: Diagnosis not present

## 2021-01-20 DIAGNOSIS — E785 Hyperlipidemia, unspecified: Secondary | ICD-10-CM | POA: Diagnosis not present

## 2021-01-20 DIAGNOSIS — I10 Essential (primary) hypertension: Secondary | ICD-10-CM | POA: Diagnosis not present

## 2021-01-20 DIAGNOSIS — I255 Ischemic cardiomyopathy: Secondary | ICD-10-CM | POA: Diagnosis not present

## 2021-01-20 DIAGNOSIS — N1831 Chronic kidney disease, stage 3a: Secondary | ICD-10-CM | POA: Diagnosis not present

## 2021-01-20 DIAGNOSIS — I77810 Thoracic aortic ectasia: Secondary | ICD-10-CM

## 2021-01-20 DIAGNOSIS — I2510 Atherosclerotic heart disease of native coronary artery without angina pectoris: Secondary | ICD-10-CM

## 2021-01-20 LAB — COMPREHENSIVE METABOLIC PANEL
ALT: 22 IU/L (ref 0–44)
AST: 20 IU/L (ref 0–40)
Albumin/Globulin Ratio: 2 (ref 1.2–2.2)
Albumin: 4.4 g/dL (ref 3.7–4.7)
Alkaline Phosphatase: 87 IU/L (ref 44–121)
BUN/Creatinine Ratio: 10 (ref 10–24)
BUN: 13 mg/dL (ref 8–27)
Bilirubin Total: 0.6 mg/dL (ref 0.0–1.2)
CO2: 25 mmol/L (ref 20–29)
Calcium: 9.1 mg/dL (ref 8.6–10.2)
Chloride: 106 mmol/L (ref 96–106)
Creatinine, Ser: 1.27 mg/dL (ref 0.76–1.27)
Globulin, Total: 2.2 g/dL (ref 1.5–4.5)
Glucose: 90 mg/dL (ref 70–99)
Potassium: 4.2 mmol/L (ref 3.5–5.2)
Sodium: 141 mmol/L (ref 134–144)
Total Protein: 6.6 g/dL (ref 6.0–8.5)
eGFR: 60 mL/min/{1.73_m2} (ref >59)

## 2021-01-20 LAB — TSH: TSH: 3.49 u[IU]/mL (ref 0.450–4.500)

## 2021-01-20 MED ORDER — AMLODIPINE BESYLATE 5 MG PO TABS: 5.0000 mg | ORAL_TABLET | Freq: Every day | ORAL | 3 refills | Status: DC

## 2021-01-20 NOTE — Patient Instructions (Addendum)
Medication Instructions:  Your physician has recommended you make the following change in your medication:   START Amlodipine 5 mg taking 1 tablet daily.  CALL YOUR PRIMARY CARE DR TO MAKE AN APPT IN 2 WEEKS TO FOLLOW UP ON YOUR BLOOD PRESSURE   *If you need a refill on your cardiac medications before your next appointment, please call your pharmacy*   Lab Work: TODAY:  CMET, TSH, & UA  If you have labs (blood work) drawn today and your tests are completely normal, you will receive your results only by: [place] (if you have MyChart) OR A paper copy in the mail If you have any lab test that is abnormal or we need to change your treatment, we will call you to review the results.   Testing/Procedures: Your physician has requested that you have an echocardiogram. Echocardiography is a painless test that uses sound waves to create images of your heart. It provides your doctor with information about the size and shape of your heart and how well your heart's chambers and valves are working. This procedure takes approximately one hour. There are no restrictions for this procedure.   You have been referred to NEPHROLOGIST.  THEY WILL CALL YOU WITH AN APPOINTMENT.   Follow-Up: At [facility], you and your health needs are our priority.  As part of our continuing mission to provide you with exceptional heart care, we have created designated Provider Care Teams.  These Care Teams include your primary Cardiologist (physician) and Advanced Practice Providers (APPs -  Physician Assistants and Nurse Practitioners) who all work together to provide you with the care you need, when you need it.  We recommend signing up for the patient portal called "MyChart".  Sign up information is provided on this After Visit Summary.  MyChart is used to connect with patients for Virtual Visits (Telemedicine).  Patients are able to view lab/test results, encounter notes, upcoming appointments, etc.  Non-urgent  messages can be sent to your provider as well.   To learn more about what you can do with MyChart, go to [email or web address].    Your next appointment:   12 month(s)  The format for your next appointment:   In Person  Provider:   Lauree Chandler, MD     Other Instructions  We need to get a better idea of what your blood pressure is running at home. Here are some instructions to follow: - I would recommend using a blood pressure cuff that goes on your arm. The wrist ones can be inaccurate. If you're purchasing one for the first time, try to select one that also reports your heart rate because this can be helpful information as well. - To check your blood pressure, choose a time at least 3 hours after taking your blood pressure medicines. If you can sample it at different times of the day, that's great - it might give you more information about how your blood pressure fluctuates. Remain seated in a chair for 5 minutes quietly beforehand, then check it.  - Please record a list of those readings and call us/send in MyChart message with them for our review in 1 week.

## 2021-01-21 LAB — URINALYSIS
Bilirubin, UA: NEGATIVE
Glucose, UA: NEGATIVE
Ketones, UA: NEGATIVE
Nitrite, UA: NEGATIVE
RBC, UA: NEGATIVE
Specific Gravity, UA: 1.019 (ref 1.005–1.030)
Urobilinogen, Ur: 1 mg/dL (ref 0.2–1.0)
pH, UA: 5.5 (ref 5.0–7.5)

## 2021-01-23 ENCOUNTER — Telehealth: Payer: Self-pay | Admitting: Physician Assistant

## 2021-01-23 NOTE — Telephone Encounter (Signed)
MyChart doesn't have dual function to send both pt and nurse message at the same time without losing the message, so please let pt know the following:  "Hi Jonathan Sharp! I think since your kidney function looked a little better than prior, you can continue to follow with your primary care provider for this for now. Since you had some small amount of white blood cells in your urine I'd suggest you make an appointment with your primary care provider to discuss how often to monitor this going forward."  Can cancel referral if he prefers. Can still offer for him to see since he's been on the border of CKD stage 3 in the past.

## 2021-01-23 NOTE — Telephone Encounter (Signed)
Returned the call to the pt, he has been made aware of his lab / UA results. See result note.

## 2021-01-23 NOTE — Telephone Encounter (Signed)
Patient returning call for lab results. 

## 2021-01-23 NOTE — Telephone Encounter (Signed)
-----   Message from Charlie Pitter, [place] sent at 01/23/2021  7:48 AM EST ----- Please let patient know urinalysis showed very mild white blood cells in it, but no protein which is what we were looking for. In the absence of any urinary symptoms, nothing to do acutely aside from routine follow-up with primary care to consider a recheck in a few weeks.

## 2021-01-23 NOTE — Telephone Encounter (Signed)
Can cancel referral

## 2021-01-24 NOTE — Addendum Note (Signed)
Addended by: Gaetano Net on: 01/24/2021 06:47 AM   Modules accepted: Orders

## 2021-01-25 ENCOUNTER — Ambulatory Visit: Payer: Self-pay | Admitting: *Deleted

## 2021-01-25 NOTE — Telephone Encounter (Signed)
Reason for Disposition  Systolic BP  >= 435 OR Diastolic >= 686  Answer Assessment - Initial Assessment Questions 1. BLOOD PRESSURE: "What is the blood pressure?" "Did you take at least two measurements 5 minutes apart?"     Pt calling in because he went to the cardiologist and BP was 170/101.  I've checked it at home 150s/90 and last couple days   this am 170/95 pulse 55-60.   The PA at the cardiologist told me to see my PCP.   2. ONSET: "When did you take your blood pressure?"     This morning 170/95.   My normal 135/85 for years now it's gone up.   Last Friday I saw the cardiologist.    3. HOW: "How did you obtain the blood pressure?" (e.g., visiting nurse, automatic home BP monitor)     At the cardiologist office and I'm monitoring it at home and it's been elevated for the last 2 days. 4. HISTORY: "Do you have a history of high blood pressure?"     Yes 5. MEDICATIONS: "Are you taking any medications for blood pressure?" "Have you missed any doses recently?"     Yes  Take losarten 50 mg for the last 12 yrs 6. OTHER SYMPTOMS: "Do you have any symptoms?" (e.g., headache, chest pain, blurred vision, difficulty breathing, weakness)     I have a slight headache and my face is flushed.   Otherwise I feel great. 7. PREGNANCY: "Is there any chance you are pregnant?" "When was your last menstrual period?"     N/A  Protocols used: Blood Pressure - High-A-AH

## 2021-01-25 NOTE — Telephone Encounter (Signed)
Pt called in concerned about his BP being elevated.   He was seen in the cardiologist office last Friday for his yearly check up and his BP was 170/101 and close to that on the several readings they took while he was there.   They instructed him to call his PCP to follow up on this.   Pt has been monitoring his BP at home and it's normally 135/85 for years.   The last 2 days it's been elevated 150s/90s.   He has a slight headache and his face is flushed otherwise no other symptoms. 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   He has been on losartan 50 mg since then and done fine until now.  There are no appts available at [facility].   I have sent a high priority note into the office requesting a work in appt for him.   Pt is fine with seeing any provider.  I went over the care advice and emphasized going to the ED if any of those symptoms or having chest pain or shortness of breath develops.   Pt verbalized understanding.   "Having that heart attack was no fun".   "I will go".  Pt was agreeable to someone calling him back regarding an appt for his BP.   He can be reached at [phone].

## 2021-01-26 NOTE — Telephone Encounter (Signed)
He can double to losartan to 2 tablets daily to make 100mg  a day and schedule follow up in 2-3 weeks. He can have an acute visit slot if necessary.

## 2021-01-26 NOTE — Addendum Note (Signed)
Addended by: Birdie Sons on: 01/26/2021 08:17 AM   Modules accepted: Orders

## 2021-01-26 NOTE — Telephone Encounter (Signed)
Pt advised  apt 02/17/2021 at 8:20  Thanks,   -Mickel Baas

## 2021-02-06 MED ORDER — AMLODIPINE BESYLATE 5 MG PO TABS: 10.0000 mg | ORAL_TABLET | Freq: Every day | ORAL | 3 refills | Status: DC

## 2021-02-11 ENCOUNTER — Other Ambulatory Visit: Payer: Self-pay | Admitting: Cardiovascular Disease

## 2021-02-11 DIAGNOSIS — I2510 Atherosclerotic heart disease of native coronary artery without angina pectoris: Secondary | ICD-10-CM

## 2021-02-11 DIAGNOSIS — I10 Essential (primary) hypertension: Secondary | ICD-10-CM

## 2021-02-15 NOTE — Telephone Encounter (Signed)
Placed call to [facility] to inquire on pt's prescription for Amlodipine 5 mg bid.  Pt just picked up #90 Amlodipine and has plenty to start the increase of BID until insurance will pay, which is 02/23/21.  Pt has been made aware and was thankful for the assistance.

## 2021-02-16 NOTE — Progress Notes (Signed)
Established patient visit   Patient: Jonathan Sharp   DOB: [birth year]   70 y.o. Male  MRN: [id number] Visit Date: 02/17/2021  Today's healthcare provider: Lelon Huh, MD   No chief complaint on file.  Subjective    HPI  Hypertension, follow-up  BP Readings from Last 3 Encounters:  01/20/21 140/90  07/12/20 (!) 173/93  06/29/20 (!) 144/80   Wt Readings from Last 3 Encounters:  01/20/21 219 lb 6.4 oz (99.5 kg)  07/12/20 238 lb (108 kg)  06/29/20 241 lb (109.3 kg)     He had high blood pressure at dentist in early November and seen cardiology on 01-20-21 at which time he was started on amlodipine. Dose was increased to 2 tablets daily on 01-26-21.  He also had losartan increased from 58m daily to 540mtwice a day.  He reports good compliance with treatment. He is not having side effects.  He is following a Low Sodium diet. He is exercising. He does not smoke.  Use of agents associated with hypertension: none.   Outside blood pressures are mornings around 108/60 by dinner it is around 140/85. Symptoms: No chest pain No chest pressure  No palpitations No syncope  No dyspnea No orthopnea  No paroxysmal nocturnal dyspnea No lower extremity edema   Pertinent labs: Lab Results  Component Value Date   CHOL 132 06/29/2020   HDL 48 06/29/2020   LDLCALC 64 06/29/2020   TRIG 109 06/29/2020   CHOLHDL 2.8 06/29/2020   Lab Results  Component Value Date   NA 141 01/20/2021   K 4.2 01/20/2021   CREATININE 1.27 01/20/2021   EGFR 60 01/20/2021   GLUCOSE 90 01/20/2021   TSH 3.490 01/20/2021     The 10-year ASCVD risk score (Arnett DK, et al., 2019) is: 23.4%   ---------------------------------------------------------------------------------------------------     Medications: Outpatient Medications Prior to Visit  Medication Sig   amLODipine (NORVASC) 5 MG tablet Take 2 tablets (10 mg total) by mouth daily.   aspirin 81 MG tablet Take 1 tablet (81 mg total) by  mouth daily.   carvedilol (COREG) 6.25 MG tablet TAKE 1 TABLET BY MOUTH TWICE A DAY WITH MEALS   famotidine (PEPCID) 20 MG tablet TAKE 1 TABLET BY MOUTH TWICE A DAY   losartan (COZAAR) 50 MG tablet TAKE 1 TABLET BY MOUTH TWICE A DAY   nitroGLYCERIN (NITROSTAT) 0.4 MG SL tablet Place 1 tablet (0.4 mg total) under the tongue every 5 (five) minutes as needed for chest pain.   simvastatin (ZOCOR) 20 MG tablet Take 1 tablet (20 mg total) by mouth daily at 6 PM.   tamsulosin (FLOMAX) 0.4 MG CAPS capsule Take 1 capsule (0.4 mg total) by mouth every evening.   No facility-administered medications prior to visit.    Review of Systems     Objective    BP 107/70 (BP Location: Right Arm, Patient Position: Sitting, Cuff Size: Normal)   Pulse 65   Temp 97.8 F (36.6 C) (Oral)   Ht 6' 1" (1.854 m)   Wt 215 lb 1.6 oz (97.6 kg)   SpO2 100%   BMI 28.38 kg/m  {Show previous vital signs (optional):23777}  Physical Exam   General appearance:  Well developed, well nourished male, cooperative and in no acute distress Head: Normocephalic, without obvious abnormality, atraumatic Respiratory: Respirations even and unlabored, normal respiratory rate Extremities: All extremities are intact.  Skin: Skin color, texture, turgor normal. No rashes seen  Psych: Appropriate mood  and affect. Neurologic: Mental status: Alert, oriented to person, place, and time, thought content appropriate.  No results found for any visits on 02/17/21.  Assessment & Plan     1. Primary hypertension Over the last month has started amlodipine and titrated to 2 x 5mg every morning, and increase losartan from 50QD to 50 BID, now with much better BP, but is much lower in the morning than in the afternoons. He also takes tamsulosin every night which is likely contributing to lower morning BP. Advised to take 2 amlodipine and 2 losartan every morning instead of dividing the losartan. Continue carvedilol BID and tamsulosin QHS. Advised  losartan could be change to 100mg tablets and amlodipine could be changed to 10mg tables if this regiment keeps BP stable and controlled.        The entirety of the information documented in the History of Present Illness, Review of Systems and Physical Exam were personally obtained by me. Portions of this information were initially documented by the CMA and reviewed by me for thoroughness and accuracy.     Donald Fisher, MD  [facility] [phone] (phone) [phone] (fax)  [facility]  

## 2021-02-17 ENCOUNTER — Other Ambulatory Visit: Payer: Self-pay

## 2021-02-17 ENCOUNTER — Encounter: Payer: Self-pay | Admitting: Family Medicine

## 2021-02-17 ENCOUNTER — Ambulatory Visit (INDEPENDENT_AMBULATORY_CARE_PROVIDER_SITE_OTHER): Payer: Medicare Other | Admitting: Family Medicine

## 2021-02-17 VITALS — BP 107/70 | HR 65 | Temp 97.8°F | Ht 73.0 in | Wt 215.1 lb

## 2021-02-17 DIAGNOSIS — I10 Essential (primary) hypertension: Secondary | ICD-10-CM

## 2021-02-17 MED ORDER — LOSARTAN POTASSIUM 50 MG PO TABS: 100.0000 mg | ORAL_TABLET | Freq: Every morning | ORAL | Status: DC

## 2021-02-20 ENCOUNTER — Other Ambulatory Visit: Payer: Self-pay

## 2021-02-20 ENCOUNTER — Telehealth: Payer: Self-pay | Admitting: *Deleted

## 2021-02-20 ENCOUNTER — Ambulatory Visit (HOSPITAL_COMMUNITY): Payer: Medicare Other | Attending: Cardiology

## 2021-02-20 DIAGNOSIS — I10 Essential (primary) hypertension: Secondary | ICD-10-CM | POA: Diagnosis not present

## 2021-02-20 DIAGNOSIS — I351 Nonrheumatic aortic (valve) insufficiency: Secondary | ICD-10-CM

## 2021-02-20 DIAGNOSIS — I2510 Atherosclerotic heart disease of native coronary artery without angina pectoris: Secondary | ICD-10-CM | POA: Diagnosis not present

## 2021-02-20 DIAGNOSIS — N1831 Chronic kidney disease, stage 3a: Secondary | ICD-10-CM

## 2021-02-20 DIAGNOSIS — R001 Bradycardia, unspecified: Secondary | ICD-10-CM | POA: Diagnosis not present

## 2021-02-20 DIAGNOSIS — I77810 Thoracic aortic ectasia: Secondary | ICD-10-CM | POA: Diagnosis not present

## 2021-02-20 DIAGNOSIS — E785 Hyperlipidemia, unspecified: Secondary | ICD-10-CM | POA: Diagnosis not present

## 2021-02-20 DIAGNOSIS — I255 Ischemic cardiomyopathy: Secondary | ICD-10-CM | POA: Insufficient documentation

## 2021-02-20 LAB — ECHOCARDIOGRAM COMPLETE
Area-P 1/2: 2.73 cm2
P 1/2 time: 553 msec
S' Lateral: 3.1 cm

## 2021-02-20 MED ORDER — PERFLUTREN LIPID MICROSPHERE
1.0000 mL | INTRAVENOUS | Status: AC | PRN
  Administered 2021-02-20: 3 mL via INTRAVENOUS

## 2021-02-20 NOTE — Telephone Encounter (Signed)
-----   Message from Charlie Pitter, [place] sent at 02/20/2021 12:16 PM EST ----- Please let patient know echo showed normal heart pumping function. There is mild stiffness of the heart which is common in patients with history of elevated BP. He has mild leaking of aortic valve, something we typically follow within 3-5 years. There is mild dilation of the aorta with slight progression from prior (39 to 57mm). I am not acutely concerned about this but we do need to take a closer look to clarify the size. Recommend MRA of the chest/aorta to evaluate for dilated aortic root.

## 2021-03-02 ENCOUNTER — Ambulatory Visit (INDEPENDENT_AMBULATORY_CARE_PROVIDER_SITE_OTHER): Payer: Medicare Other

## 2021-03-02 DIAGNOSIS — Z0000 Encounter for general adult medical examination without abnormal findings: Secondary | ICD-10-CM

## 2021-03-02 NOTE — Patient Instructions (Signed)
[name] , Thank you for taking time to come for your Medicare Wellness Visit. I appreciate your ongoing commitment to your health goals. Please review the following plan we discussed and let me know if I can assist you in the future.   Screening recommendations/referrals: Colonoscopy: 10/12/19 Recommended yearly ophthalmology/optometry visit for glaucoma screening and checkup Recommended yearly dental visit for hygiene and checkup  Vaccinations: Influenza vaccine: 12/14/20 Pneumococcal vaccine: 01/24/17 Tdap vaccine: 10/13/18 Shingles vaccine: 05/04/18, 12/19/18   Covid-19: 04/24/19, 05/20/19, 01/21/20  Advanced directives: no  Conditions/risks identified: none  Next appointment: Follow up in one year for your annual wellness visit. 03/06/22 @ 3:40pm  Preventive Care 65 Years and Older, Male Preventive care refers to lifestyle choices and visits with your health care provider that can promote health and wellness. What does preventive care include? A yearly physical exam. This is also called an annual well check. Dental exams once or twice a year. Routine eye exams. Ask your health care provider how often you should have your eyes checked. Personal lifestyle choices, including: Daily care of your teeth and gums. Regular physical activity. Eating a healthy diet. Avoiding tobacco and drug use. Limiting alcohol use. Practicing safe sex. Taking low doses of aspirin every day. Taking vitamin and mineral supplements as recommended by your health care provider. What happens during an annual well check? The services and screenings done by your health care provider during your annual well check will depend on your age, overall health, lifestyle risk factors, and family history of disease. Counseling  Your health care provider may ask you questions about your: Alcohol use. Tobacco use. Drug use. Emotional well-being. Home and relationship well-being. Sexual activity. Eating  habits. History of falls. Memory and ability to understand (cognition). Work and work Statistician. Screening  You may have the following tests or measurements: Height, weight, and BMI. Blood pressure. Lipid and cholesterol levels. These may be checked every 5 years, or more frequently if you are over 7 years old. Skin check. Lung cancer screening. You may have this screening every year starting at age 56 if you have a 30-pack-year history of smoking and currently smoke or have quit within the past 15 years. Fecal occult blood test (FOBT) of the stool. You may have this test every year starting at age 54. Flexible sigmoidoscopy or colonoscopy. You may have a sigmoidoscopy every 5 years or a colonoscopy every 10 years starting at age 44. Prostate cancer screening. Recommendations will vary depending on your family history and other risks. Hepatitis C blood test. Hepatitis B blood test. Sexually transmitted disease (STD) testing. Diabetes screening. This is done by checking your blood sugar (glucose) after you have not eaten for a while (fasting). You may have this done every 1-3 years. Abdominal aortic aneurysm (AAA) screening. You may need this if you are a current or former smoker. Osteoporosis. You may be screened starting at age 16 if you are at high risk. Talk with your health care provider about your test results, treatment options, and if necessary, the need for more tests. Vaccines  Your health care provider may recommend certain vaccines, such as: Influenza vaccine. This is recommended every year. Tetanus, diphtheria, and acellular pertussis (Tdap, Td) vaccine. You may need a Td booster every 10 years. Zoster vaccine. You may need this after age 47. Pneumococcal 13-valent conjugate (PCV13) vaccine. One dose is recommended after age 46. Pneumococcal polysaccharide (PPSV23) vaccine. One dose is recommended after age 38. Talk to your health care provider about  which screenings and  vaccines you need and how often you need them. This information is not intended to replace advice given to you by your health care provider. Make sure you discuss any questions you have with your health care provider. Document Released: 03/25/2015 Document Revised: 11/16/2015 Document Reviewed: 12/28/2014 Elsevier Interactive Patient Education  2017 [employer] Prevention in the Home Falls can cause injuries. They can happen to people of all ages. There are many things you can do to make your home safe and to help prevent falls. What can I do on the outside of my home? Regularly fix the edges of walkways and driveways and fix any cracks. Remove anything that might make you trip as you walk through a door, such as a raised step or threshold. Trim any bushes or trees on the path to your home. Use bright outdoor lighting. Clear any walking paths of anything that might make someone trip, such as rocks or tools. Regularly check to see if handrails are loose or broken. Make sure that both sides of any steps have handrails. Any raised decks and porches should have guardrails on the edges. Have any leaves, snow, or ice cleared regularly. Use sand or salt on walking paths during winter. Clean up any spills in your garage right away. This includes oil or grease spills. What can I do in the bathroom? Use night lights. Install grab bars by the toilet and in the tub and shower. Do not use towel bars as grab bars. Use non-skid mats or decals in the tub or shower. If you need to sit down in the shower, use a plastic, non-slip stool. Keep the floor dry. Clean up any water that spills on the floor as soon as it happens. Remove soap buildup in the tub or shower regularly. Attach bath mats securely with double-sided non-slip rug tape. Do not have throw rugs and other things on the floor that can make you trip. What can I do in the bedroom? Use night lights. Make sure that you have a light by your  bed that is easy to reach. Do not use any sheets or blankets that are too big for your bed. They should not hang down onto the floor. Have a firm chair that has side arms. You can use this for support while you get dressed. Do not have throw rugs and other things on the floor that can make you trip. What can I do in the kitchen? Clean up any spills right away. Avoid walking on wet floors. Keep items that you use a lot in easy-to-reach places. If you need to reach something above you, use a strong step stool that has a grab bar. Keep electrical cords out of the way. Do not use floor polish or wax that makes floors slippery. If you must use wax, use non-skid floor wax. Do not have throw rugs and other things on the floor that can make you trip. What can I do with my stairs? Do not leave any items on the stairs. Make sure that there are handrails on both sides of the stairs and use them. Fix handrails that are broken or loose. Make sure that handrails are as long as the stairways. Check any carpeting to make sure that it is firmly attached to the stairs. Fix any carpet that is loose or worn. Avoid having throw rugs at the top or bottom of the stairs. If you do have throw rugs, attach them to the floor with  carpet tape. Make sure that you have a light switch at the top of the stairs and the bottom of the stairs. If you do not have them, ask someone to add them for you. What else can I do to help prevent falls? Wear shoes that: Do not have high heels. Have rubber bottoms. Are comfortable and fit you well. Are closed at the toe. Do not wear sandals. If you use a stepladder: Make sure that it is fully opened. Do not climb a closed stepladder. Make sure that both sides of the stepladder are locked into place. Ask someone to hold it for you, if possible. Clearly mark and make sure that you can see: Any grab bars or handrails. First and last steps. Where the edge of each step is. Use tools that  help you move around (mobility aids) if they are needed. These include: Canes. Walkers. Scooters. Crutches. Turn on the lights when you go into a dark area. Replace any light bulbs as soon as they burn out. Set up your furniture so you have a clear path. Avoid moving your furniture around. If any of your floors are uneven, fix them. If there are any pets around you, be aware of where they are. Review your medicines with your doctor. Some medicines can make you feel dizzy. This can increase your chance of falling. Ask your doctor what other things that you can do to help prevent falls. This information is not intended to replace advice given to you by your health care provider. Make sure you discuss any questions you have with your health care provider. Document Released: 12/23/2008 Document Revised: 08/04/2015 Document Reviewed: 04/02/2014 Elsevier Interactive Patient Education  2017 [employer].

## 2021-03-02 NOTE — Progress Notes (Signed)
Virtual Visit via Telephone Note  I connected with  Jonathan Sharp on 03/02/21 at  2:20 PM EST by telephone and verified that I am speaking with the correct person using two identifiers.  Location: Patient: [facility] Provider: [facility] Persons participating in the virtual visit: Jonathan Sharp   I discussed the limitations, risks, security and privacy concerns of performing an evaluation and management service by telephone and the availability of in person appointments. The patient expressed understanding and agreed to proceed.  Interactive audio and video telecommunications were attempted between this nurse and patient, however failed, due to patient having technical difficulties OR patient did not have access to video capability.  We continued and completed visit with audio only.  Some vital signs may be absent or patient reported.   Jonathan Chevy, LPN  Subjective:   Jonathan Sharp is a 70 y.o. male who presents for Medicare Annual/Subsequent preventive examination.  Review of Systems           Objective:    There were no vitals filed for this visit. There is no height or weight on file to calculate BMI.  Advanced Directives 07/12/2020 02/29/2020 10/12/2019 02/03/2019 01/27/2018 01/24/2017 02/01/2016  Does Patient Have a Medical Advance Directive? No Yes Yes Yes Yes Yes Yes  Type of Advance Directive - Jonathan Sharp Jonathan Sharp  Copy of [place] in Chart? - No - copy requested Yes - validated most recent copy scanned in chart (See row information) No - copy requested No - copy requested No - copy requested No - copy requested  Would patient like information on creating a medical advance directive? - - - - - - -  Pre-existing out of facility DNR  order (yellow form or pink MOST form) - - - - - - -    Current Medications (verified) Outpatient Encounter Medications as of 03/02/2021  Medication Sig   amLODipine (NORVASC) 5 MG tablet Take 2 tablets (10 mg total) by mouth daily.   aspirin 81 MG tablet Take 1 tablet (81 mg total) by mouth daily.   carvedilol (COREG) 6.25 MG tablet TAKE 1 TABLET BY MOUTH TWICE A DAY WITH MEALS   famotidine (PEPCID) 20 MG tablet TAKE 1 TABLET BY MOUTH TWICE A DAY   losartan (COZAAR) 50 MG tablet Take 2 tablets (100 mg total) by mouth in the morning.   nitroGLYCERIN (NITROSTAT) 0.4 MG SL tablet Place 1 tablet (0.4 mg total) under the tongue every 5 (five) minutes as needed for chest pain.   simvastatin (ZOCOR) 20 MG tablet Take 1 tablet (20 mg total) by mouth daily at 6 PM.   tamsulosin (FLOMAX) 0.4 MG CAPS capsule Take 1 capsule (0.4 mg total) by mouth every evening.   No facility-administered encounter medications on file as of 03/02/2021.    Allergies (verified) Patient has no known allergies.   History: Past Medical History:  Diagnosis Date   Chronic kidney disease, stage 3a ([place])    Coronary artery disease    a. s/p NSTEMI with DES to LAD 06/2008.   First degree AV block    HTN (hypertension)    Hyperlipidemia    Kidney stones    LV dysfunction    a. EF 40% at time of cath 06/2008, improved to normal on f/u echo.   Sinus bradycardia  Past Surgical History:  Procedure Laterality Date   CAROTID STENT  2010   CATARACT EXTRACTION, BILATERAL     COLONOSCOPY N/A 10/12/2019   Procedure: COLONOSCOPY;  Surgeon: Lesly Rubenstein, MD;  Location: [facility];  Service: Endoscopy;  Laterality: N/A;   EYE SURGERY     I & D EXTREMITY Right 10/13/2018   Procedure: IRRIGATION AND DEBRIDEMENT RIGHT HAND;  Surgeon: Leanora Cover, MD;  Location: [facility];  Service: Orthopedics;  Laterality: Right;   INTRAOCULAR LENS INSERTION Right 10/22/2016   [facility]   Family History  Problem Relation Age of Onset    Heart attack Father 62   Diabetes Brother        over weight   Social History   Socioeconomic History   Marital status: Married    Spouse name: Not on file   Number of children: 2   Years of education: Not on file   Highest education level: Some college, no degree  Occupational History   Occupation: MAINTANCE SERVICE    Comment: retired    Comment: Owns store / Games developer  Tobacco Use   Smoking status: Never   Smokeless tobacco: Never  Vaping Use   Vaping Use: Never used  Substance and Sexual Activity   Alcohol use: Yes    Alcohol/week: 1.0 - 2.0 standard drink    Types: 1 - 2 Shots of liquor per week   Drug use: No   Sexual activity: Not on file  Other Topics Concern   Not on file  Social History Narrative   He lives in [place] with his wife.2 children   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   Occasional alcohol   No drugs   Social Determinants of Radio broadcast assistant Strain: Not on file  Food Insecurity: Not on file  Transportation Needs: Not on file  Physical Activity: Not on file  Stress: Not on file  Social Connections: Not on file    Tobacco Counseling Counseling given: Not Answered   Clinical Intake:  Pre-visit preparation completed: Yes  Pain : No/denies pain     Nutritional Risks: None Diabetes: No  How often do you need to have someone help you when you read instructions, pamphlets, or other written materials from your doctor or pharmacy?: 1 - Never  Diabetic?no  Interpreter Needed?: No  Information entered by :: Jonathan Shaggy, LPN   Activities of Daily Living In your present state of health, do you have any difficulty performing the following activities: 03/01/2021 02/17/2021  Hearing? Jonathan Sharp  Vision? N N  Difficulty concentrating or making decisions? N N  Walking or climbing stairs? N N  Dressing or bathing? N N  Doing errands, shopping? N N  Preparing Food and eating ? N -  Using the Toilet? N -  In  the past six months, have you accidently leaked urine? N -  Do you have problems with loss of bowel control? N -  Managing your Medications? N -  Managing your Finances? N -  Housekeeping or managing your Housekeeping? N -  Some recent data might be hidden    Patient Care Team: Birdie Sons, MD as PCP - General (Family Medicine) Burnell Blanks, MD as PCP - Cardiology (Cardiology) Emmaline Kluver., MD as Consulting Physician (Rheumatology) Haig Prophet Hilton Cork, MD as Consulting Physician (Gastroenterology) System, Provider Not In  Indicate any recent Medical Services you may have received from other than [facility] providers in  the past year (date may be approximate).     Assessment:   This is a routine wellness examination for Jonathan Sharp.  Hearing/Vision screen No results found.  Dietary issues and exercise activities discussed:     Goals Addressed   None    Depression Screen PHQ 2/9 Scores 02/17/2021 06/29/2020 02/29/2020 06/29/2019 02/03/2019 03/19/2018 01/27/2018  PHQ - 2 Score 0 0 0 0 0 0 0  PHQ- 9 Score 0 0 - - - - -    Fall Risk Fall Risk  03/01/2021 02/17/2021 06/29/2020 02/29/2020 06/29/2019  Falls in the past year? 0 0 0 0 0  Number falls in past yr: 0 0 0 0 0  Injury with Fall? 0 0 0 0 0  Risk for fall due to : - No Fall Risks - - -    FALL RISK PREVENTION PERTAINING TO THE [facility]:  Any stairs in or around the [facility]? Yes  If so, are there any without handrails? No  Sharp free of loose throw rugs in walkways, pet beds, electrical cords, etc? Yes  Adequate lighting in your Sharp to reduce risk of falls? Yes   ASSISTIVE DEVICES UTILIZED TO PREVENT FALLS:  Life alert? No  Use of a cane, walker or w/c? No  Grab bars in the bathroom? Yes  Shower chair or bench in shower? No  Elevated toilet seat or a handicapped toilet? Yes    Cognitive Function: Normal cognitive status assessed by direct observation by this Nurse Health Advisor. No abnormalities found.        6CIT Screen 01/27/2018 02/01/2016  What Year? 0 points 0 points  What month? 0 points 0 points  What time? 0 points 0 points  Count back from 20 0 points 0 points  Months in reverse 0 points 0 points  Repeat phrase 0 points 0 points  Total Score 0 0    Immunizations Immunization History  Administered Date(s) Administered   Fluad Quad(high Dose 65+) 12/19/2018   Influenza-Unspecified 12/20/2015, 12/21/2016, 01/06/2018, 12/11/2019, 12/14/2020   Moderna Sars-Covid-2 Vaccination 04/24/2019, 05/20/2019, 01/21/2020   Pneumococcal Conjugate-13 02/01/2016   Pneumococcal Polysaccharide-23 01/24/2017   Tdap 07/18/2007, 10/13/2018   Zoster Recombinat (Shingrix) 05/04/2018, 12/19/2018    TDAP status: Up to date  Flu Vaccine status: Up to date  Pneumococcal vaccine status: Up to date  Covid-19 vaccine status: Completed vaccines  Qualifies for Shingles Vaccine? Yes   Zostavax completed Yes   Shingrix Completed?: Yes  Screening Tests Health Maintenance  Topic Date Due   COVID-19 Vaccine (4 - Booster for Moderna series) 03/17/2020   COLONOSCOPY (Pts 45-55yrs Insurance coverage [facility] need to be confirmed)  10/12/2026   TETANUS/TDAP  10/12/2028   Pneumonia Vaccine 26+ Years old  Completed   INFLUENZA VACCINE  Completed   Hepatitis C Screening  Completed   Zoster Vaccines- Shingrix  Completed   HPV VACCINES  Aged Out    Health Maintenance  Health Maintenance Due  Topic Date Due   COVID-19 Vaccine (4 - Booster for Moderna series) 03/17/2020    Colorectal cancer screening: Type of screening: Colonoscopy. Completed 10/12/19. Repeat every 10 years  Lung Cancer Screening: (Low Dose CT Chest recommended if Age 6-80 years, 30 pack-year currently smoking OR have quit w/in 15years.) does qualify.    Additional Screening:  Hepatitis C Screening: does qualify; Completed 01/25/17  Vision Screening: Recommended annual ophthalmology exams for early detection of glaucoma and other  disorders of the eye. Is the patient up to date with their annual  eye exam?  Yes  Who is the provider or what is the name of the office in which the patient attends annual eye exams? Dr.Roscoe in [facility] If pt is not established with a provider, would they like to be referred to a provider to establish care? No .   Dental Screening: Recommended annual dental exams for proper oral hygiene  Community Resource Referral / Chronic Care Management: CRR required this visit?  No   CCM required this visit?  No      Plan:     I have personally reviewed and noted the following in the patients chart:   Medical and social history Use of alcohol, tobacco or illicit drugs  Current medications and supplements including opioid prescriptions. Patient is not currently taking opioid prescriptions. Functional ability and status Nutritional status Physical activity Advanced directives List of other physicians Hospitalizations, surgeries, and ER visits in previous 12 months Vitals Screenings to include cognitive, depression, and falls Referrals and appointments  In addition, I have reviewed and discussed with patient certain preventive protocols, quality metrics, and best practice recommendations. A written personalized care plan for preventive services as well as general preventive health recommendations were provided to patient.     Jonathan Avi, LPN   [id number]/63/[id number]   Nurse Notes: none

## 2021-03-09 ENCOUNTER — Encounter: Payer: Self-pay | Admitting: Physician Assistant

## 2021-03-09 ENCOUNTER — Other Ambulatory Visit: Payer: Self-pay | Admitting: *Deleted

## 2021-03-09 MED ORDER — DIAZEPAM 5 MG PO TABS: ORAL_TABLET | ORAL | 0 refills | Status: DC

## 2021-03-09 NOTE — Progress Notes (Signed)
Rx sent in for valium dose #1 for pre-medication for cardiac MRI due to pt's claustrophobia. Relayed precautions to nurse to relay to patient has to have someone drive him and no operating heavy machinery that day due to potential sleepiness. PDMP reviewed and no inappropriate fills. Ludwika Rodd PA-C

## 2021-03-09 NOTE — Telephone Encounter (Signed)
Call placed to pt regarding messages below:  pt aware will send in Valium 5 mg to take 1 hour prior to the MRI, he will have someone drive him to / from the MRI and will not operate any heavy machinery afterwards.    From: Felipa Evener Sent: 03/09/2021   3:07 PM EST To: Jeanann Lewandowsky, RMA Subject: RE: MRI needs to be medicated                   OK to use valium 5mg  orally 1 hour before cardiac MRI if no allergy to this. Needs someone to drive him to and from study since this can make people sleepy, no operating heavy machinery afterwards. Please move this to a phone note or documentation note as well.  ----- Message ----- From: Jeanann Lewandowsky, RMA Sent: 03/09/2021   2:45 PM EST To: Jonathan Pitter, PA-C Subject: FW: MRI needs to be medicated                     ----- Message ----- From: Delynn Flavin Sent: 03/09/2021   1:46 PM EST To: Jeanann Lewandowsky, RMA Subject: MRI needs to be medicated                       Hey Good afternoon! I spoke with Jonathan Sharp and got him scheduled for the MRI but he stated his extremely claustrophobic and has had to be medicated for his previous MRI. Please get this set up for him. I am calling him today to let him know about the test date and time.

## 2021-03-15 MED ORDER — DIAZEPAM 5 MG PO TABS: ORAL_TABLET | ORAL | 0 refills | Status: DC

## 2021-03-19 ENCOUNTER — Ambulatory Visit (HOSPITAL_COMMUNITY)
Admission: RE | Admit: 2021-03-19 | Discharge: 2021-03-19 | Disposition: A | Discharge status: Home / Self Care | Payer: Medicare Other | Source: Ambulatory Visit | Attending: Physician Assistant | Admitting: Physician Assistant

## 2021-03-19 ENCOUNTER — Other Ambulatory Visit: Payer: Self-pay

## 2021-03-19 DIAGNOSIS — I77810 Thoracic aortic ectasia: Secondary | ICD-10-CM | POA: Diagnosis not present

## 2021-03-19 MED ORDER — GADOBUTROL 1 MMOL/ML IV SOLN
10.0000 mL | Freq: Once | INTRAVENOUS | Status: AC | PRN
  Administered 2021-03-19: 10 mL via INTRAVENOUS

## 2021-03-21 NOTE — Progress Notes (Signed)
Pt has been made aware of normal result and verbalized understanding.  jw

## 2021-03-29 ENCOUNTER — Other Ambulatory Visit: Payer: Self-pay | Admitting: Cardiovascular Disease

## 2021-04-03 ENCOUNTER — Other Ambulatory Visit: Payer: Self-pay | Admitting: Cardiovascular Disease

## 2021-04-03 DIAGNOSIS — I10 Essential (primary) hypertension: Secondary | ICD-10-CM

## 2021-04-03 DIAGNOSIS — I2510 Atherosclerotic heart disease of native coronary artery without angina pectoris: Secondary | ICD-10-CM

## 2021-09-07 ENCOUNTER — Other Ambulatory Visit: Payer: Self-pay | Admitting: Family Medicine

## 2021-09-07 DIAGNOSIS — N401 Enlarged prostate with lower urinary tract symptoms: Secondary | ICD-10-CM

## 2021-09-07 NOTE — Telephone Encounter (Signed)
Requested Prescriptions  Pending Prescriptions Disp Refills  . tamsulosin (FLOMAX) 0.4 MG CAPS capsule [Pharmacy Med Name: TAMSULOSIN HCL 0.4 MG CAPSULE] 90 capsule 0    Sig: TAKE 1 CAPSULE BY MOUTH EVERY EVENING     Urology: Alpha-Adrenergic Blocker Failed - 09/07/2021  2:04 AM      Failed - PSA in normal range and within 360 days    PSA  Date Value Ref Range Status  01/25/2017 0.3 < OR = 4.0 ng/mL Final    Comment:    The total PSA value from this assay system is  standardized against the WHO standard. The test  result will be approximately 20% lower when compared  to the equimolar-standardized total PSA (Beckman  Coulter). Comparison of serial PSA results should be  interpreted with this fact in mind. . This test was performed using the Siemens  chemiluminescent method. Values obtained from  different assay methods cannot be used interchangeably. PSA levels, regardless of value, should not be interpreted as absolute evidence of the presence or absence of disease.    Prostate Specific Ag, Serum  Date Value Ref Range Status  06/29/2020 0.6 0.0 - 4.0 ng/mL Final    Comment:    Roche ECLIA methodology. According to the American Urological Association, Serum PSA should decrease and remain at undetectable levels after radical prostatectomy. The AUA defines biochemical recurrence as an initial PSA value 0.2 ng/mL or greater followed by a subsequent confirmatory PSA value 0.2 ng/mL or greater. Values obtained with different assay methods or kits cannot be used interchangeably. Results cannot be interpreted as absolute evidence of the presence or absence of malignant disease.          Passed - Last BP in normal range    BP Readings from Last 1 Encounters:  02/17/21 107/70         Passed - Valid encounter within last 12 months    Recent Outpatient Visits          6 months ago Primary hypertension   [facility] Birdie Sons, MD   1 year ago Annual  physical exam   [facility] Birdie Sons, MD   1 year ago Sinus congestion   [place], Vickki Muff, PA-C   2 years ago Encounter for [employer] annual wellness exam   [facility] Birdie Sons, MD   3 years ago Annual physical exam   [facility] Birdie Sons, MD

## 2021-11-09 ENCOUNTER — Ambulatory Visit (INDEPENDENT_AMBULATORY_CARE_PROVIDER_SITE_OTHER): Payer: Medicare Other | Admitting: Family Medicine

## 2021-11-09 ENCOUNTER — Other Ambulatory Visit: Payer: Self-pay | Admitting: Family Medicine

## 2021-11-09 ENCOUNTER — Encounter: Payer: Self-pay | Admitting: Family Medicine

## 2021-11-09 ENCOUNTER — Ambulatory Visit: Payer: Self-pay

## 2021-11-09 VITALS — BP 139/80 | HR 60 | Temp 97.9°F | Resp 16 | Wt 199.4 lb

## 2021-11-09 DIAGNOSIS — I10 Essential (primary) hypertension: Secondary | ICD-10-CM

## 2021-11-09 DIAGNOSIS — Z125 Encounter for screening for malignant neoplasm of prostate: Secondary | ICD-10-CM | POA: Diagnosis not present

## 2021-11-09 DIAGNOSIS — R739 Hyperglycemia, unspecified: Secondary | ICD-10-CM | POA: Diagnosis not present

## 2021-11-09 DIAGNOSIS — N200 Calculus of kidney: Secondary | ICD-10-CM

## 2021-11-09 DIAGNOSIS — R109 Unspecified abdominal pain: Secondary | ICD-10-CM

## 2021-11-09 DIAGNOSIS — E785 Hyperlipidemia, unspecified: Secondary | ICD-10-CM

## 2021-11-09 LAB — POCT URINALYSIS DIPSTICK
Bilirubin, UA: NEGATIVE
Glucose, UA: NEGATIVE
Ketones, UA: NEGATIVE
Nitrite, UA: NEGATIVE
Protein, UA: POSITIVE — AB
Spec Grav, UA: 1.02 (ref 1.010–1.025)
Urobilinogen, UA: 0.2 E.U./dL
pH, UA: 5 (ref 5.0–8.0)

## 2021-11-09 MED ORDER — CIPROFLOXACIN HCL 500 MG PO TABS: 500.0000 mg | ORAL_TABLET | Freq: Two times a day (BID) | ORAL | 0 refills | Status: AC

## 2021-11-09 NOTE — Telephone Encounter (Signed)
     Chief Complaint: Right flank pain Symptoms: Pain, mild burning with urination Frequency: 1 week ago Pertinent Negatives: Patient denies fever Disposition: '[]'$ ED /'[]'$ Urgent Care (no appt availability in office) / '[x]'$ Appointment(In office/virtual)/ '[]'$  [facility]/ '[]'$ Home Care/ '[]'$ Refused Recommended Disposition /'[]'$ [facility]/ '[]'$  Follow-up with PCP Additional Notes:   Reason for Disposition  MODERATE pain (e.g., interferes with normal activities or awakens from sleep)  Answer Assessment - Initial Assessment Questions 1. LOCATION: "Where does it hurt?" (e.g., left, right)     Right flank pain 2. ONSET: "When did the pain start?"     1 week ago 3. SEVERITY: "How bad is the pain?" (e.g., Scale 1-10; mild, moderate, or severe)   - MILD (1-3): doesn't interfere with normal activities    - MODERATE (4-7): interferes with normal activities or awakens from sleep    - SEVERE (8-10): excruciating pain and patient unable to do normal activities (stays in bed)       Now - 6 4. PATTERN: "Does the pain come and go, or is it constant?"      Constant now 5. CAUSE: "What do you think is causing the pain?"     Kidney stone 6. OTHER SYMPTOMS:  "Do you have any other symptoms?" (e.g., fever, abdomen pain, vomiting, leg weakness, burning with urination, blood in urine)     Mild burning 7. PREGNANCY:  "Is there any chance you are pregnant?" "When was your last menstrual period?"     N/a  Protocols used: Flank Pain-A-AH

## 2021-11-09 NOTE — Assessment & Plan Note (Signed)
Presumed given extensive history and ++CVA pain

## 2021-11-09 NOTE — Progress Notes (Signed)
I,Sulibeya S Dimas,acting as a Education administrator for Gwyneth Sprout, FNP.,have documented all relevant documentation on the behalf of Gwyneth Sprout, FNP,as directed by  Gwyneth Sprout, FNP while in the presence of Gwyneth Sprout, FNP.   Established patient visit   Patient: Jonathan Sharp   DOB: September 24, 1950   71 y.o. Male  MRN: [id number] Visit Date: 11/09/2021  Today's healthcare provider: Gwyneth Sprout, FNP  Introduced to nurse practitioner role and practice setting.  All questions answered.  Discussed provider/patient relationship and expectations.   Chief Complaint  Patient presents with   Flank Pain   Subjective    Flank Pain This is a new problem. The current episode started in the past 7 days. The problem occurs constantly. The problem is unchanged. The pain is present in the lumbar spine. The quality of the pain is described as stabbing. Radiates to: abdominal area. The pain is at a severity of 6/10. The pain is moderate. The symptoms are aggravated by lying down. Stiffness is present All day. Associated symptoms include abdominal pain and dysuria. Pertinent negatives include no bladder incontinence or fever.      Medications: Outpatient Medications Prior to Visit  Medication Sig   amLODipine (NORVASC) 5 MG tablet Take 2 tablets (10 mg total) by mouth daily.   aspirin 81 MG tablet Take 1 tablet (81 mg total) by mouth daily.   carvedilol (COREG) 6.25 MG tablet TAKE 1 TABLET BY MOUTH TWICE A DAY WITH MEALS   famotidine (PEPCID) 20 MG tablet TAKE 1 TABLET BY MOUTH TWICE A DAY   losartan (COZAAR) 50 MG tablet Take 2 tablet by mouth daily in the morning.   nitroGLYCERIN (NITROSTAT) 0.4 MG SL tablet Place 1 tablet (0.4 mg total) under the tongue every 5 (five) minutes as needed for chest pain.   simvastatin (ZOCOR) 20 MG tablet Take 1 tablet (20 mg total) by mouth daily at 6 PM.   tamsulosin (FLOMAX) 0.4 MG CAPS capsule TAKE 1 CAPSULE BY MOUTH EVERY EVENING   diazepam (VALIUM) 5 MG tablet  TAKE 1 TABLET BY MOUTH 1 HOUR PRIOR TO THE MRI (Patient not taking: Reported on 11/09/2021)   No facility-administered medications prior to visit.    Review of Systems  Constitutional:  Negative for chills and fever.  Gastrointestinal:  Positive for abdominal pain.  Genitourinary:  Positive for decreased urine volume, difficulty urinating, dysuria, flank pain and urgency. Negative for bladder incontinence and hematuria.    Last CBC Lab Results  Component Value Date   WBC 4.6 06/29/2020   HGB 14.9 06/29/2020   HCT 44.6 06/29/2020   MCV 97 06/29/2020   MCH 32.4 06/29/2020   RDW 12.7 06/29/2020   PLT 167 14/43/1540   Last metabolic panel Lab Results  Component Value Date   GLUCOSE 90 01/20/2021   NA 141 01/20/2021   K 4.2 01/20/2021   CL 106 01/20/2021   CO2 25 01/20/2021   BUN 13 01/20/2021   CREATININE 1.27 01/20/2021   EGFR 60 01/20/2021   CALCIUM 9.1 01/20/2021   PROT 6.6 01/20/2021   ALBUMIN 4.4 01/20/2021   LABGLOB 2.2 01/20/2021   AGRATIO 2.0 01/20/2021   BILITOT 0.6 01/20/2021   ALKPHOS 87 01/20/2021   AST 20 01/20/2021   ALT 22 01/20/2021   ANIONGAP 10 10/13/2018   Last lipids Lab Results  Component Value Date   CHOL 132 06/29/2020   HDL 48 06/29/2020   LDLCALC 64 06/29/2020   TRIG  109 06/29/2020   CHOLHDL 2.8 06/29/2020   Last thyroid functions Lab Results  Component Value Date   TSH 3.490 01/20/2021       Objective    BP 139/80 (BP Location: Left Arm, Patient Position: Sitting, Cuff Size: Large)   Pulse 60   Temp 97.9 F (36.6 C) (Oral)   Resp 16   Wt 199 lb 6.4 oz (90.4 kg)   BMI 26.31 kg/m   BP Readings from Last 3 Encounters:  11/09/21 139/80  02/17/21 107/70  01/20/21 140/90   Wt Readings from Last 3 Encounters:  11/09/21 199 lb 6.4 oz (90.4 kg)  02/17/21 215 lb 1.6 oz (97.6 kg)  01/20/21 219 lb 6.4 oz (99.5 kg)   Physical Exam Vitals and nursing note reviewed.  Constitutional:      Appearance: Normal appearance. He is  overweight.  HENT:     Head: Normocephalic and atraumatic.  Eyes:     Pupils: Pupils are equal, round, and reactive to light.  Cardiovascular:     Rate and Rhythm: Normal rate and regular rhythm.     Pulses: Normal pulses.     Heart sounds: Normal heart sounds.  Pulmonary:     Effort: Pulmonary effort is normal.     Breath sounds: Normal breath sounds.  Abdominal:     General: Bowel sounds are normal. There is no distension.     Palpations: Abdomen is soft. There is no mass.     Tenderness: There is no abdominal tenderness. There is right CVA tenderness. There is no left CVA tenderness, guarding or rebound.     Hernia: No hernia is present.  Musculoskeletal:        General: Normal range of motion.     Cervical back: Normal range of motion.  Skin:    General: Skin is warm and dry.     Capillary Refill: Capillary refill takes less than 2 seconds.  Neurological:     General: No focal deficit present.     Mental Status: He is alert and oriented to person, place, and time. Mental status is at baseline.  Psychiatric:        Mood and Affect: Mood normal.        Behavior: Behavior normal.        Thought Content: Thought content normal.        Judgment: Judgment normal.      Results for orders placed or performed in visit on 11/09/21  POCT urinalysis dipstick  Result Value Ref Range   Color, UA yellow    Clarity, UA clear    Glucose, UA Negative Negative   Bilirubin, UA Negative    Ketones, UA Negative    Spec Grav, UA 1.020 1.010 - 1.025   Blood, UA small    pH, UA 5.0 5.0 - 8.0   Protein, UA Positive (A) Negative   Urobilinogen, UA 0.2 0.2 or 1.0 E.U./dL   Nitrite, UA Negative    Leukocytes, UA Trace (A) Negative    Assessment & Plan     Problem List Items Addressed This Visit       Cardiovascular and Mediastinum   HTN (hypertension)    Chronic, borderline However, in R kidney pain Currently on 5 mg norvasc, coreg 6.25 mg bid, 100 mg cozaar Recommend increase to  10 mg norvasc if symptoms remain      Relevant Orders   Comprehensive metabolic panel   CBC with Differential/Platelet     Genitourinary   Pain  due to calculus of kidney    Presumed given extensive history and ++CVA pain      Relevant Medications   ciprofloxacin (CIPRO) 500 MG tablet     Other   Acute flank pain - Primary    UA+; UCx ordered Will treat given very positive R CVA tenderness Encouraged pt to seek care if symptoms worsen      Relevant Medications   ciprofloxacin (CIPRO) 500 MG tablet   Other Relevant Orders   POCT urinalysis dipstick (Completed)   Comprehensive metabolic panel   CBC with Differential/Platelet   Lipid panel   Urine Culture   Elevated serum glucose    Recommend A1c given serum elevation Continue to recommend balanced, lower carb meals. Smaller meal size, adding snacks. Choosing water as drink of choice and increasing purposeful exercise.       Relevant Orders   Hemoglobin A1c   Hyperlipidemia    Chronic, on zocor 20 mg currently stable with LDL of 64 given LAD stending Repeat LP       Relevant Orders   Lipid panel   Screening for prostate cancer    Recommend PSA given change in symptoms; on flomax daily 0.4 mg      Relevant Orders   PSA     Return if symptoms worsen or fail to improve.      Vonna Kotyk, FNP, have reviewed all documentation for this visit. The documentation on 11/09/21 for the exam, diagnosis, procedures, and orders are all accurate and complete.    Gwyneth Sprout, [place] [phone] (phone) [phone] (fax)  [place]

## 2021-11-09 NOTE — Assessment & Plan Note (Signed)
UA+; UCx ordered Will treat given very positive R CVA tenderness Encouraged pt to seek care if symptoms worsen

## 2021-11-09 NOTE — Assessment & Plan Note (Signed)
Chronic, on zocor 20 mg currently stable with LDL of 64 given LAD stending Repeat LP

## 2021-11-09 NOTE — Assessment & Plan Note (Signed)
Chronic, borderline However, in R kidney pain Currently on 5 mg norvasc, coreg 6.25 mg bid, 100 mg cozaar Recommend increase to 10 mg norvasc if symptoms remain

## 2021-11-09 NOTE — Assessment & Plan Note (Signed)
Recommend A1c given serum elevation Continue to recommend balanced, lower carb meals. Smaller meal size, adding snacks. Choosing water as drink of choice and increasing purposeful exercise.

## 2021-11-09 NOTE — Assessment & Plan Note (Signed)
Recommend PSA given change in symptoms; on flomax daily 0.4 mg

## 2021-11-10 LAB — CBC WITH DIFFERENTIAL/PLATELET
Basophils Absolute: 0.1 10*3/uL (ref 0.0–0.2)
Basos: 1 %
EOS (ABSOLUTE): 0.4 10*3/uL (ref 0.0–0.4)
Eos: 8 %
Hematocrit: 41.8 % (ref 37.5–51.0)
Hemoglobin: 14.1 g/dL (ref 13.0–17.7)
Immature Grans (Abs): 0 10*3/uL (ref 0.0–0.1)
Immature Granulocytes: 0 %
Lymphocytes Absolute: 1.2 10*3/uL (ref 0.7–3.1)
Lymphs: 28 %
MCH: 32.7 pg (ref 26.6–33.0)
MCHC: 33.7 g/dL (ref 31.5–35.7)
MCV: 97 fL (ref 79–97)
Monocytes Absolute: 0.4 10*3/uL (ref 0.1–0.9)
Monocytes: 9 %
Neutrophils Absolute: 2.5 10*3/uL (ref 1.4–7.0)
Neutrophils: 54 %
Platelets: 168 10*3/uL (ref 150–450)
RBC: 4.31 x10E6/uL (ref 4.14–5.80)
RDW: 13 % (ref 11.6–15.4)
WBC: 4.5 10*3/uL (ref 3.4–10.8)

## 2021-11-10 LAB — LIPID PANEL
Chol/HDL Ratio: 2.4 ratio (ref 0.0–5.0)
Cholesterol, Total: 116 mg/dL (ref 100–199)
HDL: 49 mg/dL (ref >39)
LDL Chol Calc (NIH): 46 mg/dL (ref 0–99)
Triglycerides: 114 mg/dL (ref 0–149)
VLDL Cholesterol Cal: 21 mg/dL (ref 5–40)

## 2021-11-10 LAB — COMPREHENSIVE METABOLIC PANEL
ALT: 17 IU/L (ref 0–44)
AST: 17 IU/L (ref 0–40)
Albumin/Globulin Ratio: 2.3 — ABNORMAL HIGH (ref 1.2–2.2)
Albumin: 4.5 g/dL (ref 3.8–4.8)
Alkaline Phosphatase: 94 IU/L (ref 44–121)
BUN/Creatinine Ratio: 14 (ref 10–24)
BUN: 22 mg/dL (ref 8–27)
Bilirubin Total: 0.6 mg/dL (ref 0.0–1.2)
CO2: 22 mmol/L (ref 20–29)
Calcium: 9.3 mg/dL (ref 8.6–10.2)
Chloride: 106 mmol/L (ref 96–106)
Creatinine, Ser: 1.61 mg/dL — ABNORMAL HIGH (ref 0.76–1.27)
Globulin, Total: 2 g/dL (ref 1.5–4.5)
Glucose: 110 mg/dL — ABNORMAL HIGH (ref 70–99)
Potassium: 4.6 mmol/L (ref 3.5–5.2)
Sodium: 140 mmol/L (ref 134–144)
Total Protein: 6.5 g/dL (ref 6.0–8.5)
eGFR: 45 mL/min/{1.73_m2} — ABNORMAL LOW (ref >59)

## 2021-11-10 LAB — PSA: Prostate Specific Ag, Serum: 0.5 ng/mL (ref 0.0–4.0)

## 2021-11-10 LAB — HGB A1C W/O EAG: Hgb A1c MFr Bld: 5.6 % (ref 4.8–5.6)

## 2021-11-10 NOTE — Progress Notes (Signed)
Hi Laban,  Blood chemistry shows decline in kidney function and clearance of waste. Continue to recommend 64 oz of water per day; I also recommend follow up labs in 1 month. If elevation remains, plan to refer out to kidney specialist to ensure best care.  All other labs are normal and stable.   Continue to recommend follow up with urology if bladder symptoms continue given stable PSA.  Gwyneth Sprout, [place] [address] [place], [place] [address] [phone] (phone) [phone] (fax) [place]

## 2021-11-11 LAB — URINE CULTURE

## 2021-11-11 NOTE — Progress Notes (Signed)
Bacterial count on urine culture was low. Continue antibiotic given symptom management. Push fluids to allow for stone to pass if present. Return to clinic if symptoms worsen or do not improve.  Thanks, Gwyneth Sprout, [address] [place], [place] [address] [phone] (phone) [phone] (fax) [place]

## 2021-12-02 ENCOUNTER — Other Ambulatory Visit: Payer: Self-pay | Admitting: Family Medicine

## 2021-12-02 DIAGNOSIS — N401 Enlarged prostate with lower urinary tract symptoms: Secondary | ICD-10-CM

## 2022-01-07 ENCOUNTER — Other Ambulatory Visit: Payer: Self-pay | Admitting: Cardiovascular Disease

## 2022-01-07 DIAGNOSIS — I2510 Atherosclerotic heart disease of native coronary artery without angina pectoris: Secondary | ICD-10-CM

## 2022-01-07 DIAGNOSIS — I10 Essential (primary) hypertension: Secondary | ICD-10-CM

## 2022-01-16 NOTE — Progress Notes (Unsigned)
No chief complaint on file.  History of Present Illness: 71 yo male with h/o CAD and hyperlipidemia here today for follow up. In April 2010 he had a non-ST elevation MI treated with drug eluting stent placement in the LAD. His ejection fraction was initially 40% but this improved to 60%. Stress myoview 09/01/12 in setting of admission for chest pain with LVEF=55%, no ischemia. He had LE edema with Norvasc. He did not tolerate high dose statin therapy. Zocor lowered to 20 mg per day due to muscle aches. He has tolerated the 20 mg daily dose. Echo October 2019 with LVEF=55-60%. Grade 1 diastolic dysfunction. No significant valve disease. Echo December 2022 with LVEF=55-60%, trivial MR, mild AI. Cardiac MRI January 2023 with no evidence of thoracic aortic aneurysm.   He is here today for follow up. The patient denies any chest pain, dyspnea, palpitations, lower extremity edema, orthopnea, PND, dizziness, near syncope or syncope.   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    Primary Care Physician: Birdie Sons, MD  Past Medical History:  Diagnosis Date   Chronic kidney disease, stage 3a [facility])    Coronary artery disease    a. s/p NSTEMI with DES to LAD 06/2008.   First degree AV block    HTN (hypertension)    Hyperlipidemia    Kidney stones    LV dysfunction    a. EF 40% at time of cath 06/2008, improved to normal on f/u echo.   Sinus bradycardia     Past Surgical History:  Procedure Laterality Date   CAROTID STENT  2010   CATARACT EXTRACTION, BILATERAL     COLONOSCOPY N/A 10/12/2019   Procedure: COLONOSCOPY;  Surgeon: Lesly Rubenstein, MD;  Location: [facility] ENDOSCOPY;  Service: Endoscopy;  Laterality: N/A;   EYE SURGERY     I & D EXTREMITY Right 10/13/2018   Procedure: IRRIGATION AND DEBRIDEMENT RIGHT HAND;  Surgeon: Leanora Cover, MD;  Location: [place];  Service: Orthopedics;  Laterality: Right;   INTRAOCULAR LENS INSERTION Right 10/22/2016   [facility]    Current Outpatient  Medications  Medication Sig Dispense Refill   amLODipine (NORVASC) 5 MG tablet Take 2 tablets (10 mg total) by mouth daily. 180 tablet 3   aspirin 81 MG tablet Take 1 tablet (81 mg total) by mouth daily. 30 tablet 6   carvedilol (COREG) 6.25 MG tablet TAKE 1 TABLET BY MOUTH TWICE A DAY WITH MEALS 180 tablet 0   famotidine (PEPCID) 20 MG tablet TAKE 1 TABLET BY MOUTH TWICE A DAY 180 tablet 3   losartan (COZAAR) 50 MG tablet Take 2 tablet by mouth daily in the morning. 180 tablet 3   nitroGLYCERIN (NITROSTAT) 0.4 MG SL tablet Place 1 tablet (0.4 mg total) under the tongue every 5 (five) minutes as needed for chest pain. 75 tablet 1   simvastatin (ZOCOR) 20 MG tablet Take 1 tablet (20 mg total) by mouth daily at 6 PM. 90 tablet 3   tamsulosin (FLOMAX) 0.4 MG CAPS capsule TAKE 1 CAPSULE BY MOUTH EVERY DAY IN THE EVENING 90 capsule 2   No current facility-administered medications for this visit.    No Known Allergies  Social History   Socioeconomic History   Marital status: Married    Spouse name: Not on file   Number of children: 2   Years of education: Not on file   Highest education level: Some college, no degree  Occupational History   Occupation: [employer]  Comment: retired    Comment: Owns store / Games developer  Tobacco Use   Smoking status: Never   Smokeless tobacco: Never  Vaping Use   Vaping Use: Never used  Substance and Sexual Activity   Alcohol use: Yes    Alcohol/week: 1.0 - 2.0 standard drink of alcohol    Types: 1 - 2 Shots of liquor per week   Drug use: No   Sexual activity: Not on file  Other Topics Concern   Not on file  Social History Narrative   He lives in [place] with his wife.2 children   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   Occasional alcohol   No drugs   Social Determinants of Health   Financial Resource Strain: Low Risk  (03/02/2021)   Overall Financial Resource Strain (CARDIA)    Difficulty of Paying Living  Expenses: Not hard at all  Food Insecurity: No Food Insecurity (03/02/2021)   Hunger Vital Sign    Worried About Running Out of Food in the Last Year: Never true    Ran Out of Food in the Last Year: Never true  Transportation Needs: No Transportation Needs (03/02/2021)   PRAPARE - Hydrologist (Medical): No    Lack of Transportation (Non-Medical): No  Physical Activity: Sufficiently Active (03/02/2021)   Exercise Vital Sign    Days of Exercise per Week: 4 days    Minutes of Exercise per Session: 70 min  Stress: No Stress Concern Present (03/02/2021)   [place]    Feeling of Stress : Not at all  Social Connections: [place] (03/02/2021)   Social Connection and Isolation Panel [NHANES]    Frequency of Communication with Friends and Family: More than three times a week    Frequency of Social Gatherings with Friends and Family: Once a week    Attends Religious Services: More than 4 times per year    Active Member of [employer] or Organizations: Yes    Attends Music therapist: More than 4 times per year    Marital Status: Married  Human resources officer Violence: Not At Risk (03/02/2021)   Humiliation, Afraid, Rape, and Kick questionnaire    Fear of Current or Ex-Partner: No    Emotionally Abused: No    Physically Abused: No    Sexually Abused: No    Family History  Problem Relation Age of Onset   Heart attack Father 69   Diabetes Brother        over weight    Review of Systems:  As stated in the HPI and otherwise negative.   There were no vitals taken for this visit.  Physical Examination: General: Well developed, well nourished, NAD  HEENT: OP clear, mucus membranes moist  SKIN: warm, dry. No rashes. Neuro: No focal deficits  Musculoskeletal: Muscle strength 5/5 all ext  Psychiatric: Mood and affect normal  Neck: No JVD, no carotid bruits, no thyromegaly, no  lymphadenopathy.  Lungs:Clear bilaterally, no wheezes, rhonci, crackles Cardiovascular: Regular rate and rhythm. No murmurs, gallops or rubs. Abdomen:Soft. Bowel sounds present. Non-tender.  Extremities: No lower extremity edema. Pulses are 2 + in the bilateral DP/PT.  EKG:  EKG is *** ordered today. The ekg ordered today demonstrates  Echo December 2022:  1. Left ventricular ejection fraction, by estimation, is 55 to 60%. The  left ventricle has normal function. The left ventricle has no regional  wall motion abnormalities.  There is mild asymmetric left ventricular  hypertrophy of the basal-septal segment.  Left ventricular diastolic parameters are consistent with Grade I  diastolic dysfunction (impaired relaxation).   2. Right ventricular systolic function is normal. The right ventricular  size is normal. There is normal pulmonary artery systolic pressure. The  estimated right ventricular systolic pressure is 93.8 mmHg.   3. The mitral valve is grossly normal. Trivial mitral valve  regurgitation.   4. The aortic valve is tricuspid. There is mild calcification of the  aortic valve. There is mild thickening of the aortic valve. Aortic valve  regurgitation is mild. Aortic valve sclerosis/calcification is present,  without any evidence of aortic  stenosis.   5. Aortic dilatation noted. There is mild dilatation of the aortic root,  measuring 41 mm.   6. The inferior vena cava is normal in size with greater than 50%  respiratory variability, suggesting right atrial pressure of 3 mmHg.   Recent Labs: 01/20/2021: TSH 3.490 11/09/2021: ALT 17; BUN 22; Creatinine, Ser 1.61; Hemoglobin 14.1; Platelets 168; Potassium 4.6; Sodium 140   Lipid Panel    Component Value Date/Time   CHOL 116 11/09/2021 1412   TRIG 114 11/09/2021 1412   HDL 49 11/09/2021 1412   CHOLHDL 2.4 11/09/2021 1412   CHOLHDL 2.3 01/25/2017 0801   VLDL 29 12/14/2015 0827   LDLCALC 46 11/09/2021 1412   LDLCALC 50  01/25/2017 0801     Wt Readings from Last 3 Encounters:  11/09/21 199 lb 6.4 oz (90.4 kg)  02/17/21 215 lb 1.6 oz (97.6 kg)  01/20/21 219 lb 6.4 oz (99.5 kg)     Assessment and Plan:   1. CAD without angina: He has no chest pain. Will continue ASA, statin and beta blocker.       2. HYPERLIPIDEMIA: Lipids followed in primary care. LDL 46 in August 2023. Continue statin.   3. HTN: BP is well controlled. No changes  Labs/ tests ordered today include:  No orders of the defined types were placed in this encounter.   Disposition:   F/U with me in 12  months   Signed, Lauree Chandler, MD 01/16/2022 4:12 PM    [facility] [address], [place], [place]  [address] Phone: [phone]; Fax: [phone]

## 2022-01-17 ENCOUNTER — Encounter: Payer: Self-pay | Admitting: Cardiovascular Disease

## 2022-01-17 ENCOUNTER — Ambulatory Visit: Payer: Medicare Other | Attending: Cardiovascular Disease | Admitting: Cardiovascular Disease

## 2022-01-17 VITALS — BP 128/74 | HR 49 | Ht 73.0 in | Wt 200.0 lb

## 2022-01-17 DIAGNOSIS — E785 Hyperlipidemia, unspecified: Secondary | ICD-10-CM

## 2022-01-17 DIAGNOSIS — I2510 Atherosclerotic heart disease of native coronary artery without angina pectoris: Secondary | ICD-10-CM

## 2022-01-17 DIAGNOSIS — I10 Essential (primary) hypertension: Secondary | ICD-10-CM

## 2022-01-17 NOTE — Patient Instructions (Signed)
Medication Instructions:  No changes *If you need a refill on your cardiac medications before your next appointment, please call your pharmacy*   Lab Work: none If you have labs (blood work) drawn today and your tests are completely normal, you will receive your results only by: MyChart Message (if you have MyChart) OR A paper copy in the mail If you have any lab test that is abnormal or we need to change your treatment, we will call you to review the results.   Testing/Procedures: none   Follow-Up: At [facility], you and your health needs are our priority.  As part of our continuing mission to provide you with exceptional heart care, we have created designated Provider Care Teams.  These Care Teams include your primary Cardiologist (physician) and Advanced Practice Providers (APPs -  Physician Assistants and Nurse Practitioners) who all work together to provide you with the care you need, when you need it.   Your next appointment:   12 month(s)  The format for your next appointment:   In Person  Provider:   Christopher McAlhany, MD     Important Information About Sugar       

## 2022-01-19 NOTE — Addendum Note (Signed)
Addended by: Janan Halter F on: 01/19/2022 10:45 AM   Modules accepted: Orders

## 2022-02-14 ENCOUNTER — Other Ambulatory Visit: Payer: Self-pay | Admitting: Physician Assistant

## 2022-02-14 ENCOUNTER — Other Ambulatory Visit: Payer: Self-pay | Admitting: Cardiovascular Disease

## 2022-02-14 DIAGNOSIS — I2510 Atherosclerotic heart disease of native coronary artery without angina pectoris: Secondary | ICD-10-CM

## 2022-02-14 DIAGNOSIS — I10 Essential (primary) hypertension: Secondary | ICD-10-CM

## 2022-03-11 ENCOUNTER — Other Ambulatory Visit: Payer: Self-pay | Admitting: Cardiovascular Disease

## 2022-03-20 ENCOUNTER — Ambulatory Visit (INDEPENDENT_AMBULATORY_CARE_PROVIDER_SITE_OTHER): Payer: Medicare Other

## 2022-03-20 VITALS — Ht 73.0 in | Wt 200.0 lb

## 2022-03-20 DIAGNOSIS — Z0000 Encounter for general adult medical examination without abnormal findings: Secondary | ICD-10-CM | POA: Diagnosis not present

## 2022-03-20 NOTE — Progress Notes (Signed)
Virtual Visit via Telephone Note  I connected with  Jonathan Sharp on 03/20/22 at  9:30 AM EST by telephone and verified that I am speaking with the correct person using two identifiers.  Location: Patient: home Provider: [facility] Persons participating in the virtual visit: [place]   I discussed the limitations, risks, security and privacy concerns of performing an evaluation and management service by telephone and the availability of in person appointments. The patient expressed understanding and agreed to proceed.  Interactive audio and video telecommunications were attempted between this nurse and patient, however failed, due to patient having technical difficulties OR patient did not have access to video capability.  We continued and completed visit with audio only.  Some vital signs may be absent or patient reported.   Dionisio Isaack, LPN  Subjective:   Jonathan Sharp is a 72 y.o. male who presents for Medicare Annual/Subsequent preventive examination.  Review of Systems     Cardiac Risk Factors include: advanced age (>26mn, >>55women);male gender;hypertension     Objective:    There were no vitals filed for this visit. There is no height or weight on file to calculate BMI.     03/20/2022    9:36 AM 03/02/2021    2:23 PM 07/12/2020    9:24 AM 02/29/2020    8:44 AM 10/12/2019    7:19 AM 02/03/2019    8:40 AM 01/27/2018   10:57 AM  Advanced Directives  Does Patient Have a Medical Advance Directive? No No No Yes Yes Yes Yes  Type of AScientist, research (medical)Living will H[place]inLiving will HEssexLiving will HLong LakeLiving will  Copy of HL[place]in Chart?    No - copy requested Yes - validated most recent copy scanned in chart (See row information) No - copy requested No - copy requested  Would patient like information on creating a medical advance  directive? No - Patient declined No - Patient declined         Current Medications (verified) Outpatient Encounter Medications as of 03/20/2022  Medication Sig   amLODipine (NORVASC) 5 MG tablet TAKE 2 TABLETS BY MOUTH EVERY DAY   aspirin 81 MG tablet Take 1 tablet (81 mg total) by mouth daily.   carvedilol (COREG) 6.25 MG tablet TAKE 1 TABLET BY MOUTH TWICE A DAY WITH MEALS   famotidine (PEPCID) 20 MG tablet TAKE 1 TABLET BY MOUTH TWICE A DAY   losartan (COZAAR) 50 MG tablet TAKE 2 TABLET BY MOUTH DAILY IN THE MORNING.   nitroGLYCERIN (NITROSTAT) 0.4 MG SL tablet Place 1 tablet (0.4 mg total) under the tongue every 5 (five) minutes as needed for chest pain.   simvastatin (ZOCOR) 20 MG tablet TAKE 1 TABLET BY MOUTH DAILY AT 6 PM.   tamsulosin (FLOMAX) 0.4 MG CAPS capsule TAKE 1 CAPSULE BY MOUTH EVERY DAY IN THE EVENING   No facility-administered encounter medications on file as of 03/20/2022.    Allergies (verified) Patient has no known allergies.   History: Past Medical History:  Diagnosis Date   Chronic kidney disease, stage 3a ([facility]    Coronary artery disease    a. s/p NSTEMI with DES to LAD 06/2008.   First degree AV block    HTN (hypertension)    Hyperlipidemia    Kidney stones    LV dysfunction    a. EF 40% at time of cath 06/2008, improved  to normal on f/u echo.   Sinus bradycardia    Past Surgical History:  Procedure Laterality Date   CAROTID STENT  2010   CATARACT EXTRACTION, BILATERAL     COLONOSCOPY N/A 10/12/2019   Procedure: COLONOSCOPY;  Surgeon: Lesly Rubenstein, MD;  Location: [facility] ENDOSCOPY;  Service: Endoscopy;  Laterality: N/A;   EYE SURGERY     I & D EXTREMITY Right 10/13/2018   Procedure: IRRIGATION AND DEBRIDEMENT RIGHT HAND;  Surgeon: Leanora Cover, MD;  Location: [place];  Service: Orthopedics;  Laterality: Right;   INTRAOCULAR LENS INSERTION Right 10/22/2016   [facility]   Family History  Problem Relation Age of Onset   Heart attack Father 43   Diabetes  Brother        over weight   Social History   Socioeconomic History   Marital status: Married    Spouse name: Not on file   Number of children: 2   Years of education: Not on file   Highest education level: Some college, no degree  Occupational History   Occupation: MAINTANCE SERVICE    Comment: retired    Comment: Owns store / Games developer  Tobacco Use   Smoking status: Never   Smokeless tobacco: Never  Vaping Use   Vaping Use: Never used  Substance and Sexual Activity   Alcohol use: Yes    Alcohol/week: 1.0 - 2.0 standard drink of alcohol    Types: 1 - 2 Shots of liquor per week   Drug use: No   Sexual activity: Not on file  Other Topics Concern   Not on file  Social History Narrative   He lives in [place] with his wife.2 children   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   Occasional alcohol   No drugs   Social Determinants of Health   Financial Resource Strain: Low Risk  (03/20/2022)   Overall Financial Resource Strain (CARDIA)    Difficulty of Paying Living Expenses: Not hard at all  Food Insecurity: No Food Insecurity (03/20/2022)   Hunger Vital Sign    Worried About Running Out of Food in the Last Year: Never true    Ran Out of Food in the Last Year: Never true  Transportation Needs: No Transportation Needs (03/20/2022)   PRAPARE - Hydrologist (Medical): No    Lack of Transportation (Non-Medical): No  Physical Activity: Sufficiently Active (03/20/2022)   Exercise Vital Sign    Days of Exercise per Week: 5 days    Minutes of Exercise per Session: 60 min  Stress: No Stress Concern Present (03/20/2022)   [place]    Feeling of Stress : Not at all  Social Connections: Moderately Integrated (03/20/2022)   Social Connection and Isolation Panel [NHANES]    Frequency of Communication with Friends and Family: Twice a week    Frequency of Social Gatherings with  Friends and Family: Once a week    Attends Religious Services: More than 4 times per year    Active Member of [employer] or Organizations: No    Attends Music therapist: Never    Marital Status: Married    Tobacco Counseling Counseling given: Not Answered   Clinical Intake:  Pre-visit preparation completed: Yes  Pain : No/denies pain     Nutritional Risks: None Diabetes: No  How often do you need to have someone help you when you read instructions, pamphlets, or other  written materials from your doctor or pharmacy?: 1 - Never  Diabetic?no  Interpreter Needed?: No  Information entered by :: Kirke Shaggy, LPN   Activities of Daily Living    03/20/2022    9:37 AM 03/20/2022    8:05 AM  In your present state of health, do you have any difficulty performing the following activities:  Hearing? 1 1  Vision? 0 0  Difficulty concentrating or making decisions? 0 0  Walking or climbing stairs? 0 0  Dressing or bathing? 0 0  Doing errands, shopping? 0 0  Preparing Food and eating ? N N  Using the Toilet? N N  In the past six months, have you accidently leaked urine? N N  Do you have problems with loss of bowel control? N N  Managing your Medications? N N  Managing your Finances? N N  Housekeeping or managing your Housekeeping? N N    Patient Care Team: Birdie Sons, MD as PCP - General (Family Medicine) Burnell Blanks, MD as PCP - Cardiology (Cardiology) Emmaline Kluver., MD as Consulting Physician (Rheumatology) Haig Prophet Hilton Cork, MD as Consulting Physician (Gastroenterology) System, Provider Not In  Indicate any recent Medical Services you may have received from other than [facility] providers in the past year (date may be approximate).     Assessment:   This is a routine wellness examination for Jonathan Sharp.  Hearing/Vision screen Hearing Screening - Comments:: Wears aids Vision Screening - Comments:: No glasses- Dr.Roscoe  Dietary issues and  exercise activities discussed: Current Exercise Habits: Home exercise routine, Type of exercise: walking;stretching, Time (Minutes): 60, Frequency (Times/Week): 5, Weekly Exercise (Minutes/Week): 300, Intensity: Mild   Goals Addressed             This Visit's Progress    DIET - EAT MORE FRUITS AND VEGETABLES         Depression Screen    03/20/2022    9:34 AM 11/09/2021    1:52 PM 03/02/2021    2:20 PM 02/17/2021    8:21 AM 06/29/2020    9:06 AM 02/29/2020    8:38 AM 06/29/2019    9:16 AM  PHQ 2/9 Scores  PHQ - 2 Score 0 0 0 0 0 0 0  PHQ- 9 Score 0 0 0 0 0      Fall Risk    03/20/2022    9:36 AM 03/20/2022    8:05 AM 11/09/2021    1:51 PM 03/02/2021    2:25 PM 03/01/2021    4:30 PM  Fall Risk   Falls in the past year? 0 0 0 0 0  Number falls in past yr: 0  0 0 0  Injury with Fall? 0  0 0 0  Risk for fall due to : No Fall Risks  No Fall Risks No Fall Risks   Follow up Falls prevention discussed;Falls evaluation completed  Falls evaluation completed Falls evaluation completed     FALL RISK PREVENTION PERTAINING TO THE HOME:  Any stairs in or around the home? Yes  If so, are there any without handrails? No  Home free of loose throw rugs in walkways, pet beds, electrical cords, etc? Yes  Adequate lighting in your home to reduce risk of falls? Yes   ASSISTIVE DEVICES UTILIZED TO PREVENT FALLS:  Life alert? No  Use of a cane, walker or w/c? No  Grab bars in the bathroom? Yes  Shower chair or bench in shower? Yes  Elevated toilet seat or a  handicapped toilet? No    Cognitive Function:        03/20/2022    9:38 AM 01/27/2018   11:02 AM 02/01/2016    3:10 PM  6CIT Screen  What Year? 0 points 0 points 0 points  What month? 0 points 0 points 0 points  What time? 0 points 0 points 0 points  Count back from 20 0 points 0 points 0 points  Months in reverse 0 points 0 points 0 points  Repeat phrase 0 points 0 points 0 points  Total Score 0 points 0 points 0 points     Immunizations Immunization History  Administered Date(s) Administered   Fluad Quad(high Dose 65+) 12/19/2018   Influenza, High Dose Seasonal PF 12/21/2021   Influenza-Unspecified 12/20/2015, 12/21/2016, 01/06/2018, 12/11/2019, 12/14/2020   Moderna Sars-Covid-2 Vaccination 04/24/2019, 05/20/2019, 01/21/2020   Pneumococcal Conjugate-13 02/01/2016   Pneumococcal Polysaccharide-23 01/24/2017   Tdap 07/18/2007, 10/13/2018   Zoster Recombinat (Shingrix) 05/04/2018, 12/19/2018    TDAP status: Up to date  Flu Vaccine status: Up to date  Pneumococcal vaccine status: Up to date  Covid-19 vaccine status: Completed vaccines  Qualifies for Shingles Vaccine? Yes   Zostavax completed No   Shingrix Completed?: Yes  Screening Tests Health Maintenance  Topic Date Due   COVID-19 Vaccine (4 - 2023-24 season) 11/10/2021   Medicare Annual Wellness (AWV)  03/21/2023   COLONOSCOPY (Pts 45-39yr Insurance coverage will need to be confirmed)  10/12/2026   DTaP/Tdap/Td (3 - Td or Tdap) 10/12/2028   Pneumonia Vaccine 74 Years old  Completed   INFLUENZA VACCINE  Completed   Hepatitis C Screening  Completed   Zoster Vaccines- Shingrix  Completed   HPV VACCINES  Aged Out    Health Maintenance  Health Maintenance Due  Topic Date Due   COVID-19 Vaccine (4 - 2023-24 season) 11/10/2021    Colorectal cancer screening: Type of screening: Colonoscopy. Completed 10/12/19. Repeat every 7 years  Lung Cancer Screening: (Low Dose CT Chest recommended if Age 74-80years, 30 pack-year currently smoking OR have quit w/in 15years.) does not qualify.   Additional Screening:  Hepatitis C Screening: does qualify; Completed 01/25/17  Vision Screening: Recommended annual ophthalmology exams for early detection of glaucoma and other disorders of the eye. Is the patient up to date with their annual eye exam?  Yes  Who is the provider or what is the name of the office in which the patient attends annual eye  exams? Dr/Roscoe If pt is not established with a provider, would they like to be referred to a provider to establish care? No .   Dental Screening: Recommended annual dental exams for proper oral hygiene  Community Resource Referral / Chronic Care Management: CRR required this visit?  No   CCM required this visit?  No      Plan:     I have personally reviewed and noted the following in the patient's chart:   Medical and social history Use of alcohol, tobacco or illicit drugs  Current medications and supplements including opioid prescriptions. Patient is not currently taking opioid prescriptions. Functional ability and status Nutritional status Physical activity Advanced directives List of other physicians Hospitalizations, surgeries, and ER visits in previous 12 months Vitals Screenings to include cognitive, depression, and falls Referrals and appointments  In addition, I have reviewed and discussed with patient certain preventive protocols, quality metrics, and best practice recommendations. A written personalized care plan for preventive services as well as general preventive health recommendations were provided to  patient.     Dionisio Woodfin, LPN   09/14/1516   Nurse Notes: none

## 2022-03-20 NOTE — Patient Instructions (Signed)
[name] , Thank you for taking time to come for your Medicare Wellness Visit. I appreciate your ongoing commitment to your health goals. Please review the following plan we discussed and let me know if I can assist you in the future.   Screening recommendations/referrals: Colonoscopy: 10/12/19 Recommended yearly ophthalmology/optometry visit for glaucoma screening and checkup Recommended yearly dental visit for hygiene and checkup  Vaccinations: Influenza vaccine: 12/21/21 Pneumococcal vaccine: 01/24/17 Tdap vaccine: 10/13/18 Shingles vaccine: Shingrix 05/04/18, 12/19/18   Covid-19: 04/24/19, 05/20/19, 01/21/20  Advanced directives: no  Conditions/risks identified: none  Next appointment: Follow up in one year for your annual wellness visit. 03/25/23 2 9:15 am by phone  Preventive Care 74 Years and Older, Male Preventive care refers to lifestyle choices and visits with your health care provider that can promote health and wellness. What does preventive care include? A yearly physical exam. This is also called an annual well check. Dental exams once or twice a year. Routine eye exams. Ask your health care provider how often you should have your eyes checked. Personal lifestyle choices, including: Daily care of your teeth and gums. Regular physical activity. Eating a healthy diet. Avoiding tobacco and drug use. Limiting alcohol use. Practicing safe sex. Taking low doses of aspirin every day. Taking vitamin and mineral supplements as recommended by your health care provider. What happens during an annual well check? The services and screenings done by your health care provider during your annual well check will depend on your age, overall health, lifestyle risk factors, and family history of disease. Counseling  Your health care provider may ask you questions about your: Alcohol use. Tobacco use. Drug use. Emotional well-being. Home and relationship well-being. Sexual  activity. Eating habits. History of falls. Memory and ability to understand (cognition). Work and work Statistician. Screening  You may have the following tests or measurements: Height, weight, and BMI. Blood pressure. Lipid and cholesterol levels. These may be checked every 5 years, or more frequently if you are over 50 years old. Skin check. Lung cancer screening. You may have this screening every year starting at age 33 if you have a 30-pack-year history of smoking and currently smoke or have quit within the past 15 years. Fecal occult blood test (FOBT) of the stool. You may have this test every year starting at age 49. Flexible sigmoidoscopy or colonoscopy. You may have a sigmoidoscopy every 5 years or a colonoscopy every 10 years starting at age 50. Prostate cancer screening. Recommendations will vary depending on your family history and other risks. Hepatitis C blood test. Hepatitis B blood test. Sexually transmitted disease (STD) testing. Diabetes screening. This is done by checking your blood sugar (glucose) after you have not eaten for a while (fasting). You may have this done every 1-3 years. Abdominal aortic aneurysm (AAA) screening. You may need this if you are a current or former smoker. Osteoporosis. You may be screened starting at age 35 if you are at high risk. Talk with your health care provider about your test results, treatment options, and if necessary, the need for more tests. Vaccines  Your health care provider may recommend certain vaccines, such as: Influenza vaccine. This is recommended every year. Tetanus, diphtheria, and acellular pertussis (Tdap, Td) vaccine. You may need a Td booster every 10 years. Zoster vaccine. You may need this after age 27. Pneumococcal 13-valent conjugate (PCV13) vaccine. One dose is recommended after age 7. Pneumococcal polysaccharide (PPSV23) vaccine. One dose is recommended after age 96. Talk to your  health care provider about which  screenings and vaccines you need and how often you need them. This information is not intended to replace advice given to you by your health care provider. Make sure you discuss any questions you have with your health care provider. Document Released: 03/25/2015 Document Revised: 11/16/2015 Document Reviewed: 12/28/2014 Elsevier Interactive Patient Education  2017 [employer] Prevention in the Home Falls can cause injuries. They can happen to people of all ages. There are many things you can do to make your home safe and to help prevent falls. What can I do on the outside of my home? Regularly fix the edges of walkways and driveways and fix any cracks. Remove anything that might make you trip as you walk through a door, such as a raised step or threshold. Trim any bushes or trees on the path to your home. Use bright outdoor lighting. Clear any walking paths of anything that might make someone trip, such as rocks or tools. Regularly check to see if handrails are loose or broken. Make sure that both sides of any steps have handrails. Any raised decks and porches should have guardrails on the edges. Have any leaves, snow, or ice cleared regularly. Use sand or salt on walking paths during winter. Clean up any spills in your garage right away. This includes oil or grease spills. What can I do in the bathroom? Use night lights. Install grab bars by the toilet and in the tub and shower. Do not use towel bars as grab bars. Use non-skid mats or decals in the tub or shower. If you need to sit down in the shower, use a plastic, non-slip stool. Keep the floor dry. Clean up any water that spills on the floor as soon as it happens. Remove soap buildup in the tub or shower regularly. Attach bath mats securely with double-sided non-slip rug tape. Do not have throw rugs and other things on the floor that can make you trip. What can I do in the bedroom? Use night lights. Make sure that you have a  light by your bed that is easy to reach. Do not use any sheets or blankets that are too big for your bed. They should not hang down onto the floor. Have a firm chair that has side arms. You can use this for support while you get dressed. Do not have throw rugs and other things on the floor that can make you trip. What can I do in the kitchen? Clean up any spills right away. Avoid walking on wet floors. Keep items that you use a lot in easy-to-reach places. If you need to reach something above you, use a strong step stool that has a grab bar. Keep electrical cords out of the way. Do not use floor polish or wax that makes floors slippery. If you must use wax, use non-skid floor wax. Do not have throw rugs and other things on the floor that can make you trip. What can I do with my stairs? Do not leave any items on the stairs. Make sure that there are handrails on both sides of the stairs and use them. Fix handrails that are broken or loose. Make sure that handrails are as long as the stairways. Check any carpeting to make sure that it is firmly attached to the stairs. Fix any carpet that is loose or worn. Avoid having throw rugs at the top or bottom of the stairs. If you do have throw rugs, attach them  to the floor with carpet tape. Make sure that you have a light switch at the top of the stairs and the bottom of the stairs. If you do not have them, ask someone to add them for you. What else can I do to help prevent falls? Wear shoes that: Do not have high heels. Have rubber bottoms. Are comfortable and fit you well. Are closed at the toe. Do not wear sandals. If you use a stepladder: Make sure that it is fully opened. Do not climb a closed stepladder. Make sure that both sides of the stepladder are locked into place. Ask someone to hold it for you, if possible. Clearly mark and make sure that you can see: Any grab bars or handrails. First and last steps. Where the edge of each step  is. Use tools that help you move around (mobility aids) if they are needed. These include: Canes. Walkers. Scooters. Crutches. Turn on the lights when you go into a dark area. Replace any light bulbs as soon as they burn out. Set up your furniture so you have a clear path. Avoid moving your furniture around. If any of your floors are uneven, fix them. If there are any pets around you, be aware of where they are. Review your medicines with your doctor. Some medicines can make you feel dizzy. This can increase your chance of falling. Ask your doctor what other things that you can do to help prevent falls. This information is not intended to replace advice given to you by your health care provider. Make sure you discuss any questions you have with your health care provider. Document Released: 12/23/2008 Document Revised: 08/04/2015 Document Reviewed: 04/02/2014 Elsevier Interactive Patient Education  2017 [employer].

## 2022-04-07 ENCOUNTER — Other Ambulatory Visit: Payer: Self-pay | Admitting: Cardiovascular Disease

## 2022-04-07 DIAGNOSIS — I10 Essential (primary) hypertension: Secondary | ICD-10-CM

## 2022-04-07 DIAGNOSIS — I2510 Atherosclerotic heart disease of native coronary artery without angina pectoris: Secondary | ICD-10-CM

## 2022-05-22 IMAGING — MR MR MRA CHEST W/ OR W/O CM
14 series · 16 of 16 positions shown · IV contrast (10 GADAVIST)
Comparison: None.

CLINICAL DATA: Dilated aortic root

EXAM:
MRA CHEST WITH OR WITHOUT CONTRAST
TECHNIQUE: Angiographic images of the chest were obtained using MRA technique
without and with intravenous contrast.
CONTRAST:  10mL GADAVIST GADOBUTROL 1 MMOL/ML IV SOLN

[Series 2: cor haste · coronal · 8.0mm · 1.25mm/px · 1 of 27 slices shown]
[im 1/27]
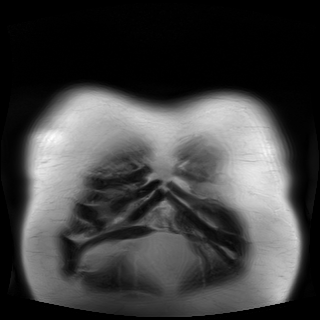

[Series 3: cor_trufi_fb · coronal · 4.5mm · 0.74mm/px · 1 of 128 slices shown]
[im 1/128]
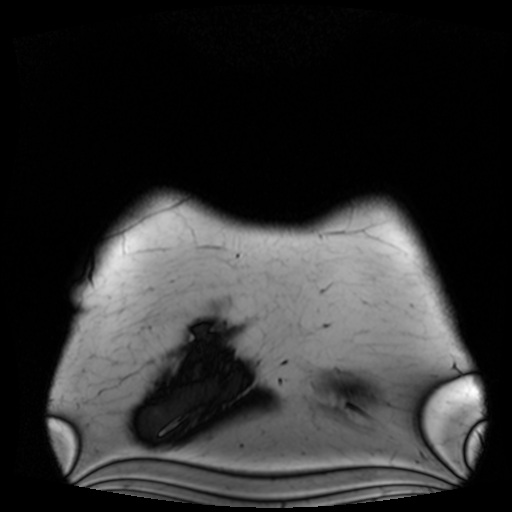

[Series 5: ax _haste_trig · axial · 8.0mm · 1.64mm/px · 1 of 26 slices shown]
[im 1/26]
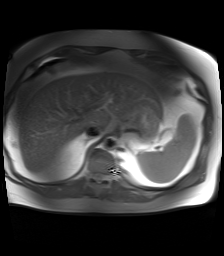

[Series 6: ax_trufi · axial · 8.0mm · 1.72mm/px · 1 of 26 slices shown]
[im 1/26]
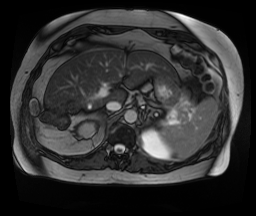

[Series 7: T1 dynamic · axial · 3.0mm · 1.25mm/px · 1 of 80 slices shown (1 of 7)]
[im 1/80]
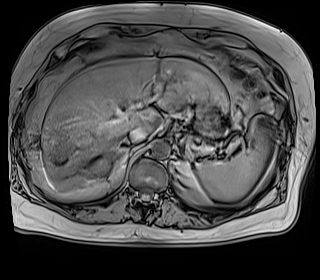

[Series 8: T1 dynamic · axial · 3.0mm · 1.25mm/px · 1 of 80 slices shown (2 of 7)]
[im 1/80]
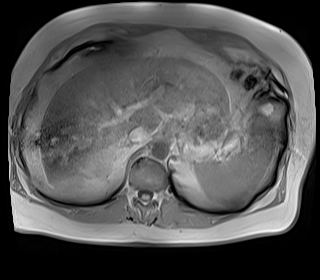

[Series 10: T1 dynamic · axial · 3.0mm · 1.25mm/px · 1 of 80 slices shown (3 of 7)]
[im 1/80]
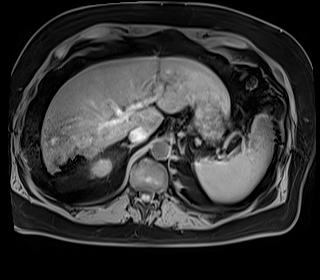

[Series 11: angio_fl3d_parasag_p3_pre · sagittal · 1.2mm · 0.99mm/px · 1 of 104 slices shown]
[im 1/104]
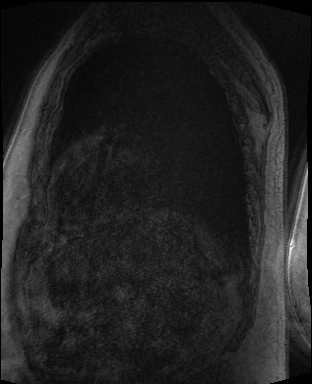

[Series 14: angio_fl3d_parasag_p3_post · sagittal · 1.2mm · 0.99mm/px · 2 of 104 slices shown]
[im 1/104]
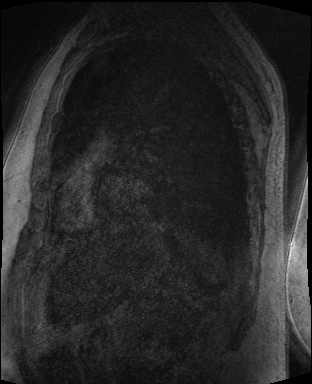
[im 104/104]
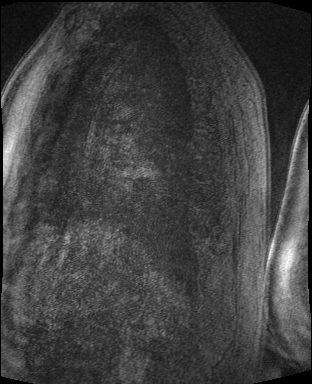

[Series 15: angio_fl3d_parasag_p3_post_sub · sagittal · 1.2mm · 0.99mm/px · 2 of 104 slices shown]
[im 1/104]
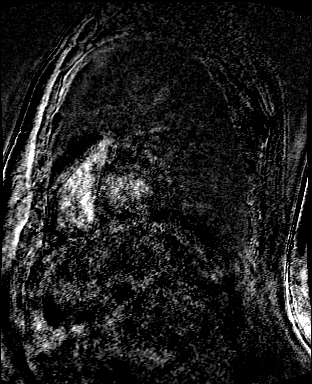
[im 104/104]
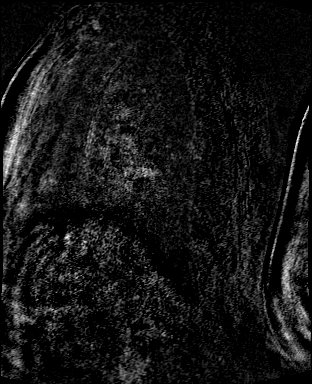

[Series 17: T1 dynamic · axial · 3.0mm · 1.25mm/px · 1 of 80 slices shown (4 of 7)]
[im 1/80]
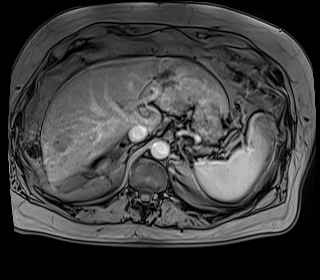

[Series 18: T1 dynamic · axial · 3.0mm · 1.25mm/px · 1 of 80 slices shown (5 of 7)]
[im 1/80]
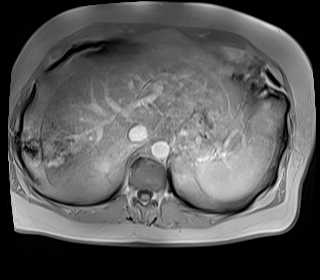

[Series 20: T1 dynamic · axial · 3.0mm · 1.25mm/px · 1 of 80 slices shown (6 of 7)]
[im 1/80]
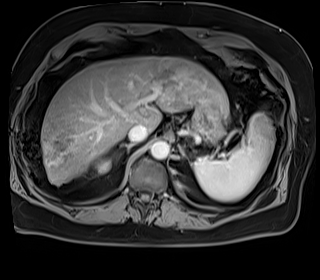

[Series 22: T1 dynamic · coronal · 3.0mm · 1.41mm/px · 1 of 72 slices shown (7 of 7)]
[im 1/72]
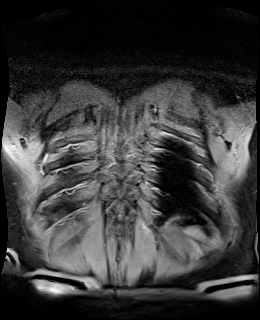

[16 of 16 positions shown; findings below may reference images not displayed]

FINDINGS: VASCULAR

Aorta: Conventional 3 vessel arch anatomy. Normal caliber of the
ascending, transverse and descending thoracic aorta. No evidence of
aneurysm. Similarly, the aortic root is normal in caliber with a
maximal diameter of 3.5 cm. The heart is normal in size. The main
pulmonary artery is normal in size. No central signal defect to
suggest pulmonary embolus. No pericardial effusion.

Heart: Normal in size.

Pulmonary Arteries:  Normal.

Other: None.

NON-VASCULAR

Lungs/pleura: No focal signal abnormality or abnormal enhancement.

Mediastinum: Insert skip mediastinum

Upper abdomen: Circumscribed T2 hyperintense T1 hypointense
nonenhancing structures within the right liver and left liver are
most consistent with simple cysts. The largest on the right measures
up to 2.3 cm. Similarly, there is a T2 hyperintense nonenhancing
simple cyst in the upper pole of the left kidney measuring 1.7 cm.
No acute abnormality in the visualized upper abdomen.

Musculoskeletal: No focal signal abnormality or abnormal
enhancement.
IMPRESSION: VASCULAR

1. No evidence of thoracic aortic aneurysm, dissection or other
abnormality.
2. The aortic root is normal in caliber.

NON-VASCULAR

1. No acute abnormality within the chest or visualized upper
abdomen.
2. Incidental note is made of simple cysts in the liver and left
kidney.

## 2022-09-05 ENCOUNTER — Other Ambulatory Visit: Payer: Self-pay | Admitting: Family Medicine

## 2022-09-05 DIAGNOSIS — N401 Enlarged prostate with lower urinary tract symptoms: Secondary | ICD-10-CM

## 2022-09-05 NOTE — Telephone Encounter (Signed)
Requested Prescriptions  Pending Prescriptions Disp Refills   tamsulosin (FLOMAX) 0.4 MG CAPS capsule [Pharmacy Med Name: TAMSULOSIN HCL 0.4 MG CAPSULE] 90 capsule 0    Sig: TAKE 1 CAPSULE BY MOUTH EVERY EVENING     Urology: Alpha-Adrenergic Blocker Passed - 09/05/2022  2:22 AM      Passed - PSA in normal range and within 360 days    PSA  Date Value Ref Range Status  01/25/2017 0.3 < OR = 4.0 ng/mL Final    Comment:    The total PSA value from this assay system is  standardized against the WHO standard. The test  result will be approximately 20% lower when compared  to the equimolar-standardized total PSA (Beckman  Coulter). Comparison of serial PSA results should be  interpreted with this fact in mind. . This test was performed using the Siemens  chemiluminescent method. Values obtained from  different assay methods cannot be used interchangeably. PSA levels, regardless of value, should not be interpreted as absolute evidence of the presence or absence of disease.    Prostate Specific Ag, Serum  Date Value Ref Range Status  11/09/2021 0.5 0.0 - 4.0 ng/mL Final    Comment:    Roche ECLIA methodology. According to the American Urological Association, Serum PSA should decrease and remain at undetectable levels after radical prostatectomy. The AUA defines biochemical recurrence as an initial PSA value 0.2 ng/mL or greater followed by a subsequent confirmatory PSA value 0.2 ng/mL or greater. Values obtained with different assay methods or kits cannot be used interchangeably. Results cannot be interpreted as absolute evidence of the presence or absence of malignant disease.          Passed - Last BP in normal range    BP Readings from Last 1 Encounters:  01/17/22 128/74         Passed - Valid encounter within last 12 months    Recent Outpatient Visits           10 months ago Acute flank pain   [place] [facility] Jacky Kindle, FNP   1 year ago  Primary hypertension   [place] [facility] Malva Limes, MD   2 years ago Annual physical exam   [facility] Malva Limes, MD   2 years ago Sinus congestion   [place] [facility] Chrismon, Jodell Cipro, PA-C   3 years ago Encounter for [employer] annual wellness exam   [facility] Sherrie Mustache, Demetrios Isaacs, MD

## 2022-10-08 ENCOUNTER — Ambulatory Visit: Payer: Self-pay

## 2022-10-08 NOTE — Patient Outreach (Signed)
  Care Coordination   Initial Visit Note   10/08/2022 Name: Jonathan Sharp MRN: [id number] DOB: October 13, 1950  Jonathan Sharp is a 72 y.o. year old male who sees Fisher, Demetrios Isaacs, MD for primary care. I spoke with  Jonathan Sharp by phone today.  What matters to the patients health and wellness today?  CM educated patient on [facility] services.  Patient declines services and feels that he is able to manage his medical needs.  Patient agreed to contact his provider in the future if he needs [facility] services.    Goals Addressed   None     SDOH assessments and interventions completed:  No     Care Coordination Interventions:  No, not indicated   Follow up plan: No further intervention required.   Encounter Outcome:  Pt. Refused

## 2022-12-01 ENCOUNTER — Other Ambulatory Visit: Payer: Self-pay | Admitting: Family Medicine

## 2022-12-01 DIAGNOSIS — N401 Enlarged prostate with lower urinary tract symptoms: Secondary | ICD-10-CM

## 2022-12-03 ENCOUNTER — Other Ambulatory Visit: Payer: Self-pay | Admitting: Cardiovascular Disease

## 2022-12-03 NOTE — Telephone Encounter (Signed)
Requested Prescriptions  Pending Prescriptions Disp Refills   tamsulosin (FLOMAX) 0.4 MG CAPS capsule [Pharmacy Med Name: TAMSULOSIN HCL 0.4 MG CAPSULE] 90 capsule 0    Sig: TAKE 1 CAPSULE BY MOUTH EVERY DAY IN THE EVENING     Urology: Alpha-Adrenergic Blocker Failed - 12/01/2022  8:33 AM      Failed - PSA in normal range and within 360 days    PSA  Date Value Ref Range Status  01/25/2017 0.3 < OR = 4.0 ng/mL Final    Comment:    The total PSA value from this assay system is  standardized against the WHO standard. The test  result will be approximately 20% lower when compared  to the equimolar-standardized total PSA (Beckman  Coulter). Comparison of serial PSA results should be  interpreted with this fact in mind. . This test was performed using the Siemens  chemiluminescent method. Values obtained from  different assay methods cannot be used interchangeably. PSA levels, regardless of value, should not be interpreted as absolute evidence of the presence or absence of disease.    Prostate Specific Ag, Serum  Date Value Ref Range Status  11/09/2021 0.5 0.0 - 4.0 ng/mL Final    Comment:    Roche ECLIA methodology. According to the American Urological Association, Serum PSA should decrease and remain at undetectable levels after radical prostatectomy. The AUA defines biochemical recurrence as an initial PSA value 0.2 ng/mL or greater followed by a subsequent confirmatory PSA value 0.2 ng/mL or greater. Values obtained with different assay methods or kits cannot be used interchangeably. Results cannot be interpreted as absolute evidence of the presence or absence of malignant disease.          Passed - Last BP in normal range    BP Readings from Last 1 Encounters:  01/17/22 128/74         Passed - Valid encounter within last 12 months    Recent Outpatient Visits           1 year ago Acute flank pain   [place] [facility] Jacky Kindle, FNP   1 year  ago Primary hypertension   [place] [facility] Malva Limes, MD   2 years ago Annual physical exam   [facility] Malva Limes, MD   2 years ago Sinus congestion   [place] [facility] Chrismon, Jodell Cipro, PA-C   3 years ago Encounter for [employer] annual wellness exam   [facility] Sherrie Mustache, Demetrios Isaacs, MD       Future Appointments             In 2 months [place] James, Nile Dear, MD [facility]

## 2023-02-03 ENCOUNTER — Other Ambulatory Visit: Payer: Self-pay | Admitting: Cardiovascular Disease

## 2023-02-08 ENCOUNTER — Other Ambulatory Visit: Payer: Self-pay | Admitting: Cardiovascular Disease

## 2023-02-08 DIAGNOSIS — I2510 Atherosclerotic heart disease of native coronary artery without angina pectoris: Secondary | ICD-10-CM

## 2023-02-08 DIAGNOSIS — I10 Essential (primary) hypertension: Secondary | ICD-10-CM

## 2023-02-18 NOTE — Progress Notes (Unsigned)
No chief complaint on file.  History of Present Illness: 72 yo male with h/o CAD and hyperlipidemia here today for follow up. In April 2010 he had a non-ST elevation MI treated with drug eluting stent placement in the LAD. His ejection fraction was initially 40% but this improved to 60%. Stress myoview 09/01/12 in setting of admission for chest pain with LVEF=55%, no ischemia. He had LE edema with Norvasc. He did not tolerate high dose statin therapy. Zocor lowered to 20 mg per day due to muscle aches. He has tolerated the 20 mg daily dose. Echo October 2019 with LVEF=55-60%. Grade 1 diastolic dysfunction. No significant valve disease. Echo December 2022 with LVEF=55-60%, trivial MR, mild AI. Cardiac MRI January 2023 with no evidence of thoracic aortic aneurysm.   He is here today for follow up. The patient denies any chest pain, dyspnea, palpitations, lower extremity edema, orthopnea, PND, dizziness, near syncope or syncope.   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    Primary Care Physician: Malva Limes, MD  Past Medical History:  Diagnosis Date   Chronic kidney disease, stage 3a [facility])    Coronary artery disease    a. s/p NSTEMI with DES to LAD 06/2008.   First degree AV block    HTN (hypertension)    Hyperlipidemia    Kidney stones    LV dysfunction    a. EF 40% at time of cath 06/2008, improved to normal on f/u echo.   Sinus bradycardia     Past Surgical History:  Procedure Laterality Date   CAROTID STENT  2010   CATARACT EXTRACTION, BILATERAL     COLONOSCOPY N/A 10/12/2019   Procedure: COLONOSCOPY;  Surgeon: Regis Bill, MD;  Location: [facility] ENDOSCOPY;  Service: Endoscopy;  Laterality: N/A;   EYE SURGERY     I & D EXTREMITY Right 10/13/2018   Procedure: IRRIGATION AND DEBRIDEMENT RIGHT HAND;  Surgeon: Betha Loa, MD;  Location: [facility] OR;  Service: Orthopedics;  Laterality: Right;   INTRAOCULAR LENS INSERTION Right 10/22/2016   [facility]    Current Outpatient  Medications  Medication Sig Dispense Refill   amLODipine (NORVASC) 5 MG tablet Take 2 tablets (10 mg total) by mouth daily. Pt needs to keep upcoming appt in Dec for further refills 60 tablet 0   aspirin 81 MG tablet Take 1 tablet (81 mg total) by mouth daily. 30 tablet 6   carvedilol (COREG) 6.25 MG tablet TAKE 1 TABLET BY MOUTH TWICE A DAY WITH FOOD 180 tablet 3   famotidine (PEPCID) 20 MG tablet TAKE 1 TABLET BY MOUTH TWICE A DAY 180 tablet 0   losartan (COZAAR) 50 MG tablet TAKE 2 TABLET BY MOUTH DAILY IN THE MORNING. 180 tablet 1   nitroGLYCERIN (NITROSTAT) 0.4 MG SL tablet Place 1 tablet (0.4 mg total) under the tongue every 5 (five) minutes as needed for chest pain. 75 tablet 1   simvastatin (ZOCOR) 20 MG tablet Take 1 tablet (20 mg total) by mouth daily at 6 PM. Pt needs to keep upcoming appt in Dec for further refills 30 tablet 0   tamsulosin (FLOMAX) 0.4 MG CAPS capsule TAKE 1 CAPSULE BY MOUTH EVERY DAY IN THE EVENING 90 capsule 0   No current facility-administered medications for this visit.    No Known Allergies  Social History   Socioeconomic History   Marital status: Married    Spouse name: Not on file   Number of children: 2   Years of education:  Not on file   Highest education level: Some college, no degree  Occupational History   Occupation: MAINTANCE SERVICE    Comment: retired    Comment: Owns store / Music therapist  Tobacco Use   Smoking status: Never   Smokeless tobacco: Never  Vaping Use   Vaping status: Never Used  Substance and Sexual Activity   Alcohol use: Yes    Alcohol/week: 1.0 - 2.0 standard drink of alcohol    Types: 1 - 2 Shots of liquor per week   Drug use: No   Sexual activity: Not on file  Other Topics Concern   Not on file  Social History Narrative   He lives in [place] with his wife.2 children   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   Occasional alcohol   No drugs   Social Determinants of Health   Financial  Resource Strain: Low Risk  (03/20/2022)   Overall Financial Resource Strain (CARDIA)    Difficulty of Paying Living Expenses: Not hard at all  Food Insecurity: No Food Insecurity (03/20/2022)   Hunger Vital Sign    Worried About Running Out of Food in the Last Year: Never true    Ran Out of Food in the Last Year: Never true  Transportation Needs: No Transportation Needs (03/20/2022)   PRAPARE - Administrator, Civil Service (Medical): No    Lack of Transportation (Non-Medical): No  Physical Activity: Sufficiently Active (03/20/2022)   Exercise Vital Sign    Days of Exercise per Week: 5 days    Minutes of Exercise per Session: 60 min  Stress: No Stress Concern Present (03/20/2022)   [employer] of Occupational Health - Occupational Stress Questionnaire    Feeling of Stress : Not at all  Social Connections: Moderately Integrated (03/20/2022)   Social Connection and Isolation Panel [NHANES]    Frequency of Communication with Friends and Family: Twice a week    Frequency of Social Gatherings with Friends and Family: Once a week    Attends Religious Services: More than 4 times per year    Active Member of [employer] or Organizations: No    Attends Banker Meetings: Never    Marital Status: Married  Catering manager Violence: Not At Risk (03/20/2022)   Humiliation, Afraid, Rape, and Kick questionnaire    Fear of Current or Ex-Partner: No    Emotionally Abused: No    Physically Abused: No    Sexually Abused: No    Family History  Problem Relation Age of Onset   Heart attack Father 31   Diabetes Brother        over weight    Review of Systems:  As stated in the HPI and otherwise negative.   There were no vitals taken for this visit.  Physical Examination: General: Well developed, well nourished, NAD  HEENT: OP clear, mucus membranes moist  SKIN: warm, dry. No rashes. Neuro: No focal deficits  Musculoskeletal: Muscle strength 5/5 all ext  Psychiatric: Mood and  affect normal  Neck: No JVD, no carotid bruits, no thyromegaly, no lymphadenopathy.  Lungs:Clear bilaterally, no wheezes, rhonci, crackles Cardiovascular: Regular rate and rhythm. No murmurs, gallops or rubs. Abdomen:Soft. Bowel sounds present. Non-tender.  Extremities: No lower extremity edema. Pulses are 2 + in the bilateral DP/PT.  EKG:  EKG is *** ordered today. The ekg ordered today demonstrates   Recent Labs: No results found for requested labs within last 365 days.   Lipid Panel  Component Value Date/Time   CHOL 116 11/09/2021 1412   TRIG 114 11/09/2021 1412   HDL 49 11/09/2021 1412   CHOLHDL 2.4 11/09/2021 1412   CHOLHDL 2.3 01/25/2017 0801   VLDL 29 12/14/2015 0827   LDLCALC 46 11/09/2021 1412   LDLCALC 50 01/25/2017 0801     Wt Readings from Last 3 Encounters:  03/20/22 90.7 kg  01/17/22 90.7 kg  11/09/21 90.4 kg     Assessment and Plan:   1. CAD without angina: No chest pain suggestive of angina. Continue ASA, beta blocker and statin.        2. HYPERLIPIDEMIA: Lipids followed in primary care. LDL ***. Continue statin.   3. HTN: BP is controlled. No changes  Labs/ tests ordered today include:  No orders of the defined types were placed in this encounter.  Disposition:   F/U with me in 12  months  Signed, Verne Carrow, MD 02/18/2023 12:43 PM    [facility] [address] [place], [place], [place]  [address] Phone: [phone]; Fax: [phone]

## 2023-02-20 ENCOUNTER — Encounter: Payer: Self-pay | Admitting: Cardiovascular Disease

## 2023-02-20 ENCOUNTER — Ambulatory Visit: Payer: Medicare Other | Attending: Cardiovascular Disease | Admitting: Cardiovascular Disease

## 2023-02-20 VITALS — BP 128/70 | HR 62 | Ht 73.0 in | Wt 216.6 lb

## 2023-02-20 DIAGNOSIS — I10 Essential (primary) hypertension: Secondary | ICD-10-CM | POA: Diagnosis not present

## 2023-02-20 DIAGNOSIS — E785 Hyperlipidemia, unspecified: Secondary | ICD-10-CM | POA: Diagnosis not present

## 2023-02-20 DIAGNOSIS — I2510 Atherosclerotic heart disease of native coronary artery without angina pectoris: Secondary | ICD-10-CM

## 2023-02-20 MED ORDER — AMLODIPINE BESYLATE 10 MG PO TABS: 10.0000 mg | ORAL_TABLET | Freq: Every day | ORAL | 3 refills | Status: DC

## 2023-02-20 MED ORDER — NITROGLYCERIN 0.4 MG SL SUBL: 0.4000 mg | SUBLINGUAL_TABLET | SUBLINGUAL | 3 refills | Status: AC | PRN

## 2023-02-20 NOTE — Patient Instructions (Signed)

## 2023-03-05 ENCOUNTER — Other Ambulatory Visit: Payer: Self-pay | Admitting: Family Medicine

## 2023-03-05 DIAGNOSIS — N401 Enlarged prostate with lower urinary tract symptoms: Secondary | ICD-10-CM

## 2023-03-05 NOTE — Telephone Encounter (Signed)
Requested medication (s) are due for refill today: yes  Requested medication (s) are on the active medication list: yes  Last refill:  12/03/22 #90  Future visit scheduled: no  Notes to clinic:  overdue lab work   Requested Prescriptions  Pending Prescriptions Disp Refills   tamsulosin (FLOMAX) 0.4 MG CAPS capsule [Pharmacy Med Name: TAMSULOSIN HCL 0.4 MG CAPSULE] 90 capsule 0    Sig: TAKE 1 CAPSULE BY MOUTH EVERY DAY IN THE EVENING     Urology: Alpha-Adrenergic Blocker Failed - 03/05/2023  3:09 PM      Failed - PSA in normal range and within 360 days    PSA  Date Value Ref Range Status  01/25/2017 0.3 < OR = 4.0 ng/mL Final    Comment:    The total PSA value from this assay system is  standardized against the WHO standard. The test  result will be approximately 20% lower when compared  to the equimolar-standardized total PSA (Beckman  Coulter). Comparison of serial PSA results should be  interpreted with this fact in mind. . This test was performed using the Siemens  chemiluminescent method. Values obtained from  different assay methods cannot be used interchangeably. PSA levels, regardless of value, should not be interpreted as absolute evidence of the presence or absence of disease.    Prostate Specific Ag, Serum  Date Value Ref Range Status  11/09/2021 0.5 0.0 - 4.0 ng/mL Final    Comment:    Roche ECLIA methodology. According to the American Urological Association, Serum PSA should decrease and remain at undetectable levels after radical prostatectomy. The AUA defines biochemical recurrence as an initial PSA value 0.2 ng/mL or greater followed by a subsequent confirmatory PSA value 0.2 ng/mL or greater. Values obtained with different assay methods or kits cannot be used interchangeably. Results cannot be interpreted as absolute evidence of the presence or absence of malignant disease.          Passed - Last BP in normal range    BP Readings from Last 1  Encounters:  02/20/23 128/70         Passed - Valid encounter within last 12 months    Recent Outpatient Visits           1 year ago Acute flank pain   [facility] Merita Norton T, FNP   2 years ago Primary hypertension   [place] [facility] Malva Limes, MD   2 years ago Annual physical exam   [facility] Malva Limes, MD   2 years ago Sinus congestion   [facility] Chrismon, Jodell Cipro, PA-C   3 years ago Encounter for [employer] annual wellness exam   [facility] Sherrie Mustache, Demetrios Isaacs, MD

## 2023-03-07 ENCOUNTER — Other Ambulatory Visit: Payer: Self-pay | Admitting: Cardiovascular Disease

## 2023-03-07 DIAGNOSIS — I2510 Atherosclerotic heart disease of native coronary artery without angina pectoris: Secondary | ICD-10-CM

## 2023-03-07 DIAGNOSIS — I10 Essential (primary) hypertension: Secondary | ICD-10-CM

## 2023-03-09 ENCOUNTER — Other Ambulatory Visit: Payer: Self-pay | Admitting: Cardiovascular Disease

## 2023-03-27 ENCOUNTER — Other Ambulatory Visit: Payer: Self-pay | Admitting: Cardiovascular Disease

## 2023-03-27 DIAGNOSIS — I2510 Atherosclerotic heart disease of native coronary artery without angina pectoris: Secondary | ICD-10-CM

## 2023-03-27 DIAGNOSIS — I10 Essential (primary) hypertension: Secondary | ICD-10-CM

## 2023-04-09 ENCOUNTER — Ambulatory Visit (INDEPENDENT_AMBULATORY_CARE_PROVIDER_SITE_OTHER): Payer: Medicare Other | Admitting: Physician Assistant

## 2023-04-09 VITALS — BP 146/84 | HR 58 | Resp 18 | Ht 73.0 in | Wt 212.0 lb

## 2023-04-09 DIAGNOSIS — R21 Rash and other nonspecific skin eruption: Secondary | ICD-10-CM | POA: Diagnosis not present

## 2023-04-09 MED ORDER — CETIRIZINE HCL 10 MG PO TABS: 10.0000 mg | ORAL_TABLET | Freq: Every day | ORAL | 11 refills | Status: DC

## 2023-04-09 MED ORDER — PERMETHRIN 5 % EX CREA: TOPICAL_CREAM | CUTANEOUS | 0 refills | Status: DC

## 2023-04-09 MED ORDER — HYDROXYZINE PAMOATE 25 MG PO CAPS: 25.0000 mg | ORAL_CAPSULE | Freq: Three times a day (TID) | ORAL | 0 refills | Status: DC | PRN

## 2023-04-10 ENCOUNTER — Ambulatory Visit: Payer: Medicare Other | Admitting: Emergency Medicine

## 2023-04-10 VITALS — Ht 73.0 in | Wt 205.0 lb

## 2023-04-10 DIAGNOSIS — Z0000 Encounter for general adult medical examination without abnormal findings: Secondary | ICD-10-CM

## 2023-04-10 NOTE — Progress Notes (Unsigned)
Established patient visit  Patient: Jonathan Sharp   DOB: 08-16-1950   73 y.o. Male  MRN: [id number] Visit Date: 04/09/2023  Today's healthcare provider: Debera Lat, PA-C   Chief Complaint  Patient presents with   Rash    Some small bumps in some regions of body; he gets a prickly stabbing type pain all over body, worse at night, especially in shower, no numbness, with itching, OTC creams not helping ; having trouble sleeping at night; intensity shifts from various body regions like the back and the leg   Subjective     HPI     Rash    Additional comments: Some small bumps in some regions of body; he gets a prickly stabbing type pain all over body, worse at night, especially in shower, no numbness, with itching, OTC creams not helping ; having trouble sleeping at night; intensity shifts from various body regions like the back and the leg      Last edited by Ashok Cordia, CMA on 04/09/2023 11:17 AM.       Discussed the use of AI scribe software for clinical note transcription with the patient, who gave verbal consent to proceed.  History of Present Illness   The patient presents with a two-week history of severe itching that started behind the ears and has since spread to the entire body, including the back of the neck, armpits, elbows, sides, buttocks, genitals, and behind the knees. The itching is particularly severe at night, making it difficult for the patient to sleep. The patient has tried various over-the-counter remedies, including cortisone cream and Gold Bond body powder, as well as antihistamines, but none have provided relief. The patient has a history of exposure to scabies during his time in the [employer], and the current symptoms remind him of that experience. However, no one else in the patient's household, including his wife, is experiencing similar symptoms. The patient also has two cats at home, which are vaccinated.           04/09/2023   11:18 AM 03/20/2022    9:34  AM 11/09/2021    1:52 PM  Depression screen PHQ 2/9  Decreased Interest 0 0 0  Down, Depressed, Hopeless 0 0 0  PHQ - 2 Score 0 0 0  Altered sleeping 1 0 0  Tired, decreased energy 0 0 0  Change in appetite 0 0 0  Feeling bad or failure about yourself  0 0 0  Trouble concentrating 0 0 0  Moving slowly or fidgety/restless 0 0 0  Suicidal thoughts 0 0 0  PHQ-9 Score 1 0 0  Difficult doing work/chores  Not difficult at all Not difficult at all      04/09/2023   11:19 AM  GAD 7 : Generalized Anxiety Score  Nervous, Anxious, on Edge 0  Control/stop worrying 0  Worry too much - different things 0  Trouble relaxing 0  Restless 0  Easily annoyed or irritable 0  Afraid - awful might happen 0  Total GAD 7 Score 0    Medications: Outpatient Medications Prior to Visit  Medication Sig   amLODipine (NORVASC) 10 MG tablet Take 1 tablet (10 mg total) by mouth daily.   aspirin 81 MG tablet Take 1 tablet (81 mg total) by mouth daily.   carvedilol (COREG) 6.25 MG tablet TAKE 1 TABLET BY MOUTH TWICE A DAY WITH FOOD   famotidine (PEPCID) 20 MG tablet TAKE 1 TABLET BY MOUTH TWICE A DAY  losartan (COZAAR) 50 MG tablet TAKE 2 TABLET BY MOUTH DAILY IN THE MORNING.   nitroGLYCERIN (NITROSTAT) 0.4 MG SL tablet Place 1 tablet (0.4 mg total) under the tongue every 5 (five) minutes as needed for chest pain.   simvastatin (ZOCOR) 20 MG tablet TAKE 1 TABLET BY MOUTH DAILY AT 6 PM. PT NEEDS TO KEEP UPCOMING APPT IN DEC FOR FURTHER REFILLS   tamsulosin (FLOMAX) 0.4 MG CAPS capsule TAKE 1 CAPSULE BY MOUTH EVERY DAY IN THE EVENING   No facility-administered medications prior to visit.    Review of Systems All negative Except see HPI   {Insert previous labs (optional):23779} {See past labs  Heme  Chem  Endocrine  Serology  Results Review (optional):1}   Objective    BP (!) 146/84   Pulse (!) 58   Resp 18   Ht 6\' 1"  (1.854 m)   Wt 212 lb (96.2 kg)   SpO2 99%   BMI 27.97 kg/m  {Insert last  BP/Wt (optional):23777}{See vitals history (optional):1}   Physical Exam   No results found for any visits on 04/09/23.      Assessment and Plan    Generalized Pruritus Severe itching, especially at night, with a rash that started behind the ears and spread to the entire body. No family members affected. History of exposure to scabies 50 years ago. No relief with over-the-counter cortisone cream, Gold Bond body powder, or antihistamines. -Prescribe Zyrtec for daytime use. -Prescribe Hydroxyzine 25mg  for nighttime use, starting with half a tablet and gradually increasing as tolerated. -Advise to maintain a cool environment, take cool showers, and avoid scratching. -Advise to wash all bedding in hot water. -If no improvement by the end of the week, consider a virtual follow-up appointment. -Prescribe a cream (unspecified in conversation) to be used if other measures are not effective.  Hypertension Elevated blood pressure noted during the visit, likely due to current distress. -Advise to continue current antihypertensive medication regimen.        No orders of the defined types were placed in this encounter.   No follow-ups on file.   The patient was advised to call back or seek an in-person evaluation if the symptoms worsen or if the condition fails to improve as anticipated.  I discussed the assessment and treatment plan with the patient. The patient was provided an opportunity to ask questions and all were answered. The patient agreed with the plan and demonstrated an understanding of the instructions.  I, Debera Lat, PA-C have reviewed all documentation for this visit. The documentation on 04/09/2023  for the exam, diagnosis, procedures, and orders are all accurate and complete.  Debera Lat, [facility], MMS [facility] [phone] (phone) [phone] (fax)  [facility]

## 2023-04-10 NOTE — Patient Instructions (Addendum)
Jonathan Sharp , Thank you for taking time to come for your Medicare Wellness Visit. I appreciate your ongoing commitment to your health goals. Please review the following plan we discussed and let me know if I can assist you in the future.   Referrals/Orders/Follow-Ups/Clinician Recommendations: Keep up the good work!!  This is a list of the screening recommended for you and due dates:  Health Maintenance  Topic Date Due   COVID-19 Vaccine (4 - 2024-25 season) 11/11/2022   Medicare Annual Wellness Visit  04/09/2024   Colon Cancer Screening  10/12/2026   DTaP/Tdap/Td vaccine (3 - Td or Tdap) 10/12/2028   Pneumonia Vaccine  Completed   Flu Shot  Completed   Hepatitis C Screening  Completed   Zoster (Shingles) Vaccine  Completed   HPV Vaccine  Aged Out    Advanced directives: (Copy Requested) Please bring a copy of your health care power of attorney and living will to the office to be added to your chart at your convenience.  Next Medicare Annual Wellness Visit scheduled for next year: Yes, 04/15/24 @ 9:30am (video visit)

## 2023-04-10 NOTE — Progress Notes (Signed)
Subjective:   Jonathan Sharp is a 73 y.o. male who presents for Medicare Annual/Subsequent preventive examination.  Interactive audio and video telecommunications were attempted between this provider and patient, however failed, due to patient having technical difficulties OR patient did not have access to video capability.  We continued and completed visit with audio only.   Visit Complete: Virtual I connected with  Jonathan Sharp on 04/10/23 by a audio enabled telemedicine application and verified that I am speaking with the correct person using two identifiers.  Patient Location: Home  Provider Location: Home Office  I discussed the limitations of evaluation and management by telemedicine. The patient expressed understanding and agreed to proceed.  Vital Signs: Because this visit was a virtual/telehealth visit, some criteria may be missing or patient reported. Any vitals not documented were not able to be obtained and vitals that have been documented are patient reported.  Patient Medicare AWV questionnaire was completed by the patient on 04/04/23; I have confirmed that all information answered by patient is correct and no changes since this date.  Cardiac Risk Factors include: advanced age (>18men, >55 women);male gender;hypertension;dyslipidemia;Other (see comment), Risk factor comments: CAD     Objective:    Today's Vitals   04/10/23 0929  Weight: 205 lb (93 kg)  Height: 6\' 1"  (1.854 m)   Body mass index is 27.05 kg/m.     04/10/2023    9:41 AM 03/20/2022    9:36 AM 03/02/2021    2:23 PM 07/12/2020    9:24 AM 02/29/2020    8:44 AM 10/12/2019    7:19 AM 02/03/2019    8:40 AM  Advanced Directives  Does Patient Have a Medical Advance Directive? Yes No No No Yes Yes Yes  Type of Estate agent of [place];Living will    Healthcare Power of [place];Living will Healthcare Power of [place];Living will Healthcare Power of [place];Living will  Does patient want  to make changes to medical advance directive? No - Patient declined        Copy of Healthcare Power of Attorney in Chart? No - copy requested    No - copy requested Yes - validated most recent copy scanned in chart (See row information) No - copy requested  Would patient like information on creating a medical advance directive?  No - Patient declined No - Patient declined        Current Medications (verified) Outpatient Encounter Medications as of 04/10/2023  Medication Sig   amLODipine (NORVASC) 10 MG tablet Take 1 tablet (10 mg total) by mouth daily.   aspirin 81 MG tablet Take 1 tablet (81 mg total) by mouth daily.   carvedilol (COREG) 6.25 MG tablet TAKE 1 TABLET BY MOUTH TWICE A DAY WITH FOOD   cetirizine (ZYRTEC) 10 MG tablet Take 1 tablet (10 mg total) by mouth daily.   famotidine (PEPCID) 20 MG tablet TAKE 1 TABLET BY MOUTH TWICE A DAY   hydrOXYzine (VISTARIL) 25 MG capsule Take 1 capsule (25 mg total) by mouth every 8 (eight) hours as needed.   losartan (COZAAR) 50 MG tablet TAKE 2 TABLET BY MOUTH DAILY IN THE MORNING.   nitroGLYCERIN (NITROSTAT) 0.4 MG SL tablet Place 1 tablet (0.4 mg total) under the tongue every 5 (five) minutes as needed for chest pain.   permethrin (ELIMITE) 5 % cream Apply cream from head to feet, leave on for 8 to 14 hours, then wash with soap/water, repeat application if living mites present 14 days after  initial treatment (FDA dosage)   simvastatin (ZOCOR) 20 MG tablet TAKE 1 TABLET BY MOUTH DAILY AT 6 PM. PT NEEDS TO KEEP UPCOMING APPT IN DEC FOR FURTHER REFILLS   tamsulosin (FLOMAX) 0.4 MG CAPS capsule TAKE 1 CAPSULE BY MOUTH EVERY DAY IN THE EVENING   No facility-administered encounter medications on file as of 04/10/2023.    Allergies (verified) Patient has no known allergies.   History: Past Medical History:  Diagnosis Date   Arthritis    Chronic kidney disease, stage 3a (HCC)    Coronary artery disease    a. s/p NSTEMI with DES to LAD 06/2008.    First degree AV block    HTN (hypertension)    Hyperlipidemia    Kidney stones    LV dysfunction    a. EF 40% at time of cath 06/2008, improved to normal on f/u echo.   Myocardial infarction (HCC) 06/16/2008   Sinus bradycardia    Past Surgical History:  Procedure Laterality Date   CAROTID STENT  2010   CATARACT EXTRACTION, BILATERAL     COLONOSCOPY N/A 10/12/2019   Procedure: COLONOSCOPY;  Surgeon: Regis Bill, MD;  Location: [facility] ENDOSCOPY;  Service: Endoscopy;  Laterality: N/A;   EYE SURGERY     FRACTURE SURGERY  10/2018   I & D EXTREMITY Right 10/13/2018   Procedure: IRRIGATION AND DEBRIDEMENT RIGHT HAND;  Surgeon: Betha Loa, MD;  Location: [facility] OR;  Service: Orthopedics;  Laterality: Right;   INTRAOCULAR LENS INSERTION Right 10/22/2016   [facility]   Family History  Problem Relation Age of Onset   Diabetes Mother    Heart attack Father 65   Diabetes Brother        over weight   Social History   Socioeconomic History   Marital status: Married    Spouse name: Science writer   Number of children: 2   Years of education: Not on file   Highest education level: 12th grade  Occupational History   Occupation: MAINTANCE SERVICE    Comment: retired    Comment: Owns store / Music therapist  Tobacco Use   Smoking status: Never   Smokeless tobacco: Never  Vaping Use   Vaping status: Never Used  Substance and Sexual Activity   Alcohol use: Yes    Alcohol/week: 1.0 - 2.0 standard drink of alcohol    Types: 1 - 2 Shots of liquor per week   Drug use: No   Sexual activity: Not on file  Other Topics Concern   Not on file  Social History Narrative   He lives in [place] with his wife.2 children   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   Occasional alcohol   No drugs   Social Drivers of Corporate investment banker Strain: Low Risk  (04/10/2023)   Overall Financial Resource Strain (CARDIA)    Difficulty of Paying Living Expenses: Not hard at all  Food Insecurity:  No Food Insecurity (04/10/2023)   Hunger Vital Sign    Worried About Running Out of Food in the Last Year: Never true    Ran Out of Food in the Last Year: Never true  Transportation Needs: No Transportation Needs (04/10/2023)   PRAPARE - Administrator, Civil Service (Medical): No    Lack of Transportation (Non-Medical): No  Physical Activity: Sufficiently Active (04/10/2023)   Exercise Vital Sign    Days of Exercise per Week: 7 days    Minutes of Exercise per Session:  40 min  Stress: No Stress Concern Present (04/10/2023)   [employer] of Occupational Health - Occupational Stress Questionnaire    Feeling of Stress : Not at all  Social Connections: Moderately Integrated (04/10/2023)   Social Connection and Isolation Panel [NHANES]    Frequency of Communication with Friends and Family: Once a week    Frequency of Social Gatherings with Friends and Family: More than three times a week    Attends Religious Services: 1 to 4 times per year    Active Member of [employer] or Organizations: No    Attends Banker Meetings: Never    Marital Status: Married  Recent Concern: Social Connections - Moderately Isolated (04/04/2023)   Social Connection and Isolation Panel [NHANES]    Frequency of Communication with Friends and Family: Once a week    Frequency of Social Gatherings with Friends and Family: Once a week    Attends Religious Services: 1 to 4 times per year    Active Member of [employer] or Organizations: No    Attends Engineer, structural: Not on file    Marital Status: Married    Tobacco Counseling Counseling given: Not Answered   Clinical Intake:  Pre-visit preparation completed: Yes  Pain : No/denies pain     BMI - recorded: 27.05 Nutritional Status: BMI 25 -29 Overweight Nutritional Risks: None Diabetes: No  How often do you need to have someone help you when you read instructions, pamphlets, or other written materials from your doctor or  pharmacy?: 1 - Never  Interpreter Needed?: No  Information entered by :: Tora Kindred, CMA   Activities of Daily Living    04/10/2023    9:31 AM 04/04/2023   11:10 AM  In your present state of health, do you have any difficulty performing the following activities:  Hearing? 1 1  Comment hearing aids   Vision? 0 0  Difficulty concentrating or making decisions? 0 0  Walking or climbing stairs? 0 0  Dressing or bathing? 0 0  Doing errands, shopping? 0 0  Preparing Food and eating ? N N  Using the Toilet? N N  In the past six months, have you accidently leaked urine? N N  Do you have problems with loss of bowel control? N N  Managing your Medications? N N  Managing your Finances? N N  Housekeeping or managing your Housekeeping? N N    Patient Care Team: Malva Limes, MD as PCP - General (Family Medicine) Kathleene Hazel, MD as PCP - Cardiology (Cardiology) Kandyce Rud., MD as Consulting Physician (Rheumatology) [place] Rossie Muskrat, MD as Consulting Physician (Gastroenterology) System, Provider Not In  Indicate any recent Medical Services you may have received from other than [facility] providers in the past year (date may be approximate).     Assessment:   This is a routine wellness examination for Jonathan Sharp.  Hearing/Vision screen Hearing Screening - Comments:: Wears hearing aids Vision Screening - Comments:: Gets eye exams, Dr. Georganna Skeans, no longer accepts his insurance and he plans to get established with [facility], Dr Inez Pilgrim   Goals Addressed               This Visit's Progress     Patient Stated (pt-stated)        Lose 10 lb and stop eating chocolate      Depression Screen    04/10/2023    9:38 AM 04/09/2023   11:18 AM 03/20/2022    9:34 AM  11/09/2021    1:52 PM 03/02/2021    2:20 PM 02/17/2021    8:21 AM 06/29/2020    9:06 AM  PHQ 2/9 Scores  PHQ - 2 Score 0 0 0 0 0 0 0  PHQ- 9 Score 0 1 0 0 0 0 0    Fall Risk    04/10/2023    9:44  AM 04/09/2023   11:19 AM 04/04/2023   11:10 AM 03/20/2022    9:36 AM 03/20/2022    8:05 AM  Fall Risk   Falls in the past year? 0 0 0 0 0  Number falls in past yr: 0 0 0 0   Injury with Fall? 0 0 0 0   Risk for fall due to : No Fall Risks   No Fall Risks   Follow up Falls prevention discussed   Falls prevention discussed;Falls evaluation completed     MEDICARE RISK AT HOME: Medicare Risk at Home Any stairs in or around the home?: Yes If so, are there any without handrails?: Yes (2 steps doesn't have handrails) Home free of loose throw rugs in walkways, pet beds, electrical cords, etc?: Yes Adequate lighting in your home to reduce risk of falls?: Yes Life alert?: No Use of a cane, walker or w/c?: No Grab bars in the bathroom?: Yes Shower chair or bench in shower?: No Elevated toilet seat or a handicapped toilet?: Yes  TIMED UP AND GO:  Was the test performed?  No    Cognitive Function:        04/10/2023    9:45 AM 03/20/2022    9:38 AM 01/27/2018   11:02 AM 02/01/2016    3:10 PM  6CIT Screen  What Year? 0 points 0 points 0 points 0 points  What month? 0 points 0 points 0 points 0 points  What time? 0 points 0 points 0 points 0 points  Count back from 20 0 points 0 points 0 points 0 points  Months in reverse 0 points 0 points 0 points 0 points  Repeat phrase 0 points 0 points 0 points 0 points  Total Score 0 points 0 points 0 points 0 points    Immunizations Immunization History  Administered Date(s) Administered   Fluad Quad(high Dose 65+) 12/19/2018   Influenza, High Dose Seasonal PF 12/21/2021   Influenza-Unspecified 12/20/2015, 12/21/2016, 01/06/2018, 12/11/2019, 12/14/2020   Moderna Sars-Covid-2 Vaccination 04/24/2019, 05/20/2019, 01/21/2020   Pneumococcal Conjugate-13 02/01/2016   Pneumococcal Polysaccharide-23 01/24/2017   Tdap 07/18/2007, 10/13/2018   Zoster Recombinant(Shingrix) 05/04/2018, 12/19/2018    TDAP status: Up to date  Flu Vaccine status: Up to  date  Pneumococcal vaccine status: Up to date  Covid-19 vaccine status: Declined, Education has been provided regarding the importance of this vaccine but patient still declined. Advised may receive this vaccine at local pharmacy or Health Dept.or vaccine clinic. Aware to provide a copy of the vaccination record if obtained from local pharmacy or Health Dept. Verbalized acceptance and understanding.  Qualifies for Shingles Vaccine? Yes   Zostavax completed No   Shingrix Completed?: Yes  Screening Tests Health Maintenance  Topic Date Due   COVID-19 Vaccine (4 - 2024-25 season) 11/11/2022   Medicare Annual Wellness (AWV)  04/09/2024   Colonoscopy  10/12/2026   DTaP/Tdap/Td (3 - Td or Tdap) 10/12/2028   Pneumonia Vaccine 24+ Years old  Completed   INFLUENZA VACCINE  Completed   Hepatitis C Screening  Completed   Zoster Vaccines- Shingrix  Completed   HPV VACCINES  Aged Out    Health Maintenance  Health Maintenance Due  Topic Date Due   COVID-19 Vaccine (4 - 2024-25 season) 11/11/2022    Colorectal cancer screening: Type of screening: Colonoscopy. Completed 10/12/19. Repeat every 7 years  Lung Cancer Screening: (Low Dose CT Chest recommended if Age 63-80 years, 20 pack-year currently smoking OR have quit w/in 15years.) does not qualify.   Lung Cancer Screening Referral: n/a  Additional Screening:  Hepatitis C Screening: does not qualify; Completed 01/25/17  Vision Screening: Recommended annual ophthalmology exams for early detection of glaucoma and other disorders of the eye.  Dental Screening: Recommended annual dental exams for proper oral hygiene   Community Resource Referral / Chronic Care Management: CRR required this visit?  No   CCM required this visit?  No     Plan:     I have personally reviewed and noted the following in the patient's chart:   Medical and social history Use of alcohol, tobacco or illicit drugs  Current medications and supplements  including opioid prescriptions. Patient is not currently taking opioid prescriptions. Functional ability and status Nutritional status Physical activity Advanced directives List of other physicians Hospitalizations, surgeries, and ER visits in previous 12 months Vitals Screenings to include cognitive, depression, and falls Referrals and appointments  In addition, I have reviewed and discussed with patient certain preventive protocols, quality metrics, and best practice recommendations. A written personalized care plan for preventive services as well as general preventive health recommendations were provided to patient.     Tora Kindred, CMA   04/10/2023   After Visit Summary: (MyChart) Due to this being a telephonic visit, the after visit summary with patients personalized plan was offered to patient via MyChart   Nurse Notes:  Declined Covid vaccine Made OV for 04/19/23. Last seen for routine care >1 year. Only seen for acute visits.

## 2023-04-19 ENCOUNTER — Ambulatory Visit: Payer: Medicare Other | Admitting: Family Medicine

## 2023-04-19 ENCOUNTER — Encounter: Payer: Self-pay | Admitting: Family Medicine

## 2023-04-19 VITALS — BP 136/75 | HR 57 | Resp 16 | Ht 73.0 in | Wt 214.4 lb

## 2023-04-19 DIAGNOSIS — I2510 Atherosclerotic heart disease of native coronary artery without angina pectoris: Secondary | ICD-10-CM | POA: Diagnosis not present

## 2023-04-19 DIAGNOSIS — Z0000 Encounter for general adult medical examination without abnormal findings: Secondary | ICD-10-CM

## 2023-04-19 DIAGNOSIS — I10 Essential (primary) hypertension: Secondary | ICD-10-CM

## 2023-04-19 DIAGNOSIS — E785 Hyperlipidemia, unspecified: Secondary | ICD-10-CM | POA: Diagnosis not present

## 2023-04-19 DIAGNOSIS — Z125 Encounter for screening for malignant neoplasm of prostate: Secondary | ICD-10-CM

## 2023-04-19 DIAGNOSIS — Z0001 Encounter for general adult medical examination with abnormal findings: Secondary | ICD-10-CM

## 2023-04-19 DIAGNOSIS — R739 Hyperglycemia, unspecified: Secondary | ICD-10-CM

## 2023-04-19 NOTE — Progress Notes (Signed)
 Complete physical exam   Patient: Jonathan Sharp   DOB: May 04, 1950   73 y.o. Male  MRN: [id number] Visit Date: 04/19/2023  Today's healthcare provider: Nancyann Perry, MD   Chief Complaint  Patient presents with   Hypertension   Hyperlipidemia   Prediabetes   Annual Exam   Subjective    Discussed the use of AI scribe software for clinical note transcription with the patient, who gave verbal consent to proceed.  History of Present Illness   Jonathan Sharp is a 73 year old male who presents for an annual physical exam.  He experienced a recent episode of a skin rash that appeared a couple of weeks ago, characterized by 'little red, squashy things' resembling insect bites. The rash was widespread, affecting his chest, arms, back, legs, back of the knees, and armpits. He initially suspected a food allergy or scabies, recalling a past outbreak during his time in the [employer]. However, the rash did not appear between his fingers and toes, which is typical for scabies. He was treated with a cream and antihistamines, which alleviated the itching, although the red spots persisted for a while before resolving. He no longer takes the antihistamines as the symptoms have subsided.  He has a long-standing issue with lactose intolerance that began 25-30 years ago. Initially, he could manage symptoms with lactase supplements, but over time, his intolerance has worsened, now including reactions to milk, ice cream, pizza, and cheese. He notes that lactase supplements are no longer effective, leading to constipation without alleviating symptoms.  He mentions swelling and tingling in his feet, which has become more pronounced recently. The swelling is more noticeable at night and subsides by morning or when he elevates his feet. He is currently taking amlodipine , which he associates with the swelling.  No chest pain, heart flutter, shortness of breath, stomach problems, cramping, or changes in bowel habits.      He continues to do well with current antihypertensive and lipid medications. Continues regular follow up with Dr. Verlin for CAD.   Lab Results  Component Value Date   CHOL 116 11/09/2021   HDL 49 11/09/2021   LDLCALC 46 11/09/2021   TRIG 114 11/09/2021   CHOLHDL 2.4 11/09/2021   Lab Results  Component Value Date   NA 140 11/09/2021   K 4.6 11/09/2021   CREATININE 1.61 (H) 11/09/2021   EGFR 45 (L) 11/09/2021   GLUCOSE 110 (H) 11/09/2021       Past Medical History:  Diagnosis Date   Arthritis    Chronic kidney disease, stage 3a (HCC)    Coronary artery disease    a. s/p NSTEMI with DES to LAD 06/2008.   First degree AV block    HTN (hypertension)    Hyperlipidemia    Kidney stones    LV dysfunction    a. EF 40% at time of cath 06/2008, improved to normal on f/u echo.   Myocardial infarction (HCC) 06/16/2008   Sinus bradycardia    Past Surgical History:  Procedure Laterality Date   CAROTID STENT  2010   CATARACT EXTRACTION, BILATERAL     COLONOSCOPY N/A 10/12/2019   Procedure: COLONOSCOPY;  Surgeon: Maryruth Ole DASEN, MD;  Location: [facility] END[facility]COPY;  Service: Endoscopy;  Laterality: N/A;   EYE SURGERY     FRACTURE SURGERY  10/2018   I & D EXTREMITY Right 10/13/2018   Procedure: IRRIGATION AND DEBRIDEMENT RIGHT HAND;  Surgeon: Murrell Drivers, MD;  Location: [facility] OR;  Service: Orthopedics;  Laterality: Right;   INTRAOCULAR LENS INSERTION Right 10/22/2016   [facility]   Social History   Socioeconomic History   Marital status: Married    Spouse name: Science Writer   Number of children: 2   Years of education: Not on file   Highest education level: 12th grade  Occupational History   Occupation: MAINTANCE SERVICE    Comment: retired    Comment: Owns store / music therapist  Tobacco Use   Smoking status: Never   Smokeless tobacco: Never  Vaping Use   Vaping status: Never Used  Substance and Sexual Activity   Alcohol use: Yes    Alcohol/week: 1.0 - 2.0 standard drink of alcohol     Types: 1 - 2 Shots of liquor per week   Drug use: No   Sexual activity: Not on file  Other Topics Concern   Not on file  Social History Narrative   He lives in [place] with his wife.2 children   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   Occasional alcohol   No drugs   Social Drivers of Corporate Investment Banker Strain: Low Risk  (04/10/2023)   Overall Financial Resource Strain (CARDIA)    Difficulty of Paying Living Expenses: Not hard at all  Food Insecurity: No Food Insecurity (04/10/2023)   Hunger Vital Sign    Worried About Running Out of Food in the Last Year: Never true    Ran Out of Food in the Last Year: Never true  Transportation Needs: No Transportation Needs (04/10/2023)   PRAPARE - Administrator, Civil Service (Medical): No    Lack of Transportation (Non-Medical): No  Physical Activity: Sufficiently Active (04/10/2023)   Exercise Vital Sign    Days of Exercise per Week: 7 days    Minutes of Exercise per Session: 40 min  Stress: No Stress Concern Present (04/10/2023)   [employer] of Occupational Health - Occupational Stress Questionnaire    Feeling of Stress : Not at all  Social Connections: Moderately Integrated (04/10/2023)   Social Connection and Isolation Panel [NHANES]    Frequency of Communication with Friends and Family: Once a week    Frequency of Social Gatherings with Friends and Family: More than three times a week    Attends Religious Services: 1 to 4 times per year    Active Member of [employer] or Organizations: No    Attends Banker Meetings: Never    Marital Status: Married  Recent Concern: Social Connections - Moderately Isolated (04/04/2023)   Social Connection and Isolation Panel [NHANES]    Frequency of Communication with Friends and Family: Once a week    Frequency of Social Gatherings with Friends and Family: Once a week    Attends Religious Services: 1 to 4 times per year    Active Member of [employer]  or Organizations: No    Attends Engineer, Structural: Not on file    Marital Status: Married  Catering Manager Violence: Not At Risk (04/10/2023)   Humiliation, Afraid, Rape, and Kick questionnaire    Fear of Current or Ex-Partner: No    Emotionally Abused: No    Physically Abused: No    Sexually Abused: No   Family Status  Relation Name Status   Mother  Alive   Father  Deceased   Brother  Alive   Brother JR Alive   Daughter  Alive   Daughter  Alive   MGM  Deceased  MGF  Deceased   PGM  Deceased   PGF  Deceased  No partnership data on file   Family History  Problem Relation Age of Onset   Diabetes Mother    Heart attack Father 64   Diabetes Brother        over weight   No Known Allergies  Patient Care Team: Gasper Nancyann BRAVO, MD as PCP - General (Family Medicine) Verlin Lonni BIRCH, MD as PCP - Cardiology (Cardiology) Maryruth Ole DASEN, MD as Consulting Physician (Gastroenterology) System, Provider Not In   Medications: Outpatient Medications Prior to Visit  Medication Sig   amLODipine  (NORVASC ) 10 MG tablet Take 1 tablet (10 mg total) by mouth daily.   aspirin  81 MG tablet Take 1 tablet (81 mg total) by mouth daily.   carvedilol  (COREG ) 6.25 MG tablet TAKE 1 TABLET BY MOUTH TWICE A DAY WITH FOOD   famotidine  (PEPCID ) 20 MG tablet TAKE 1 TABLET BY MOUTH TWICE A DAY   losartan  (COZAAR ) 50 MG tablet TAKE 2 TABLET BY MOUTH DAILY IN THE MORNING.   nitroGLYCERIN  (NITROSTAT ) 0.4 MG SL tablet Place 1 tablet (0.4 mg total) under the tongue every 5 (five) minutes as needed for chest pain.   simvastatin  (ZOCOR ) 20 MG tablet TAKE 1 TABLET BY MOUTH DAILY AT 6 PM. PT NEEDS TO KEEP UPCOMING APPT IN DEC FOR FURTHER REFILLS   tamsulosin  (FLOMAX ) 0.4 MG CAPS capsule TAKE 1 CAPSULE BY MOUTH EVERY DAY IN THE EVENING   No facility-administered medications prior to visit.    Review of Systems  Constitutional:  Negative for appetite change, chills and fever.   Respiratory:  Negative for chest tightness, shortness of breath and wheezing.   Cardiovascular:  Negative for chest pain and palpitations.  Gastrointestinal:  Negative for abdominal pain, nausea and vomiting.      Objective    BP 136/75 (BP Location: Left Arm, Patient Position: Sitting, Cuff Size: Normal)   Pulse (!) 57   Resp 16   Ht 6' 1 (1.854 m)   Wt 214 lb 6.4 oz (97.3 kg)   SpO2 99%   BMI 28.29 kg/m    Physical Exam  General Appearance:    Well developed, well nourished male. Alert, cooperative, in no acute distress, appears stated age  Head:    Normocephalic, without obvious abnormality, atraumatic  Eyes:    PERRL, conjunctiva/corneas clear, EOM's intact, fundi    benign, both eyes       Ears:    Normal TM's and external ear canals, both ears  Nose:   Nares normal, septum midline, mucosa normal, no drainage   or sinus tenderness  Throat:   Lips, mucosa, and tongue normal; teeth and gums normal  Neck:   Supple, symmetrical, trachea midline, no adenopathy;       thyroid :  No enlargement/tenderness/nodules; no carotid   bruit or JVD  Back:     Symmetric, no curvature, ROM normal, no CVA tenderness  Lungs:     Clear to auscultation bilaterally, respirations unlabored  Chest wall:    No tenderness or deformity  Heart:    Bradycardic. Normal rhythm. No murmurs, rubs, or gallops.  S1 and S2 normal  Abdomen:     Soft, non-tender, bowel sounds active all four quadrants,    no masses, no organomegaly  Genitalia:    deferred  Rectal:    deferred  Extremities:   All extremities are intact. No cyanosis or edema  Pulses:   2+ and symmetric all  extremities  Skin:   Skin color, texture, turgor normal, no rashes or lesions  Lymph nodes:   Cervical, supraclavicular, and axillary nodes normal  Neurologic:   CNII-XII intact. Normal strength, sensation and reflexes      throughout    Last depression screening scores    04/10/2023    9:38 AM 04/09/2023   11:18 AM 03/20/2022     9:34 AM  PHQ 2/9 Scores  PHQ - 2 Score 0 0 0  PHQ- 9 Score 0 1 0   Last fall risk screening    04/10/2023    9:44 AM  Fall Risk   Falls in the past year? 0  Number falls in past yr: 0  Injury with Fall? 0  Risk for fall due to : No Fall Risks  Follow up Falls prevention discussed   Last Audit-C alcohol use screening    04/04/2023   11:13 AM  Alcohol Use Disorder Test (AUDIT)  1. How often do you have a drink containing alcohol? 2  2. How many drinks containing alcohol do you have on a typical day when you are drinking? 0  3. How often do you have six or more drinks on one occasion? 0  AUDIT-C Score 2      Patient-reported   A score of 3 or more in women, and 4 or more in men indicates increased risk for alcohol abuse, EXCEPT if all of the points are from question 1     Assessment & Plan    Routine Health Maintenance and Physical Exam  Exercise Activities and Dietary recommendations  Goals       Patient Stated (pt-stated)      Lose 10 lb and stop eating chocolate        Immunization History  Administered Date(s) Administered   Fluad Quad(high Dose 65+) 12/19/2018   Influenza, High Dose Seasonal PF 12/21/2021, 12/03/2022   Influenza-Unspecified 12/20/2015, 12/21/2016, 01/06/2018, 12/11/2019, 12/14/2020   Moderna Sars-Covid-2 Vaccination 04/24/2019, 05/20/2019, 01/21/2020   Pneumococcal Conjugate-13 02/01/2016   Pneumococcal Polysaccharide-23 01/24/2017   Tdap 07/18/2007, 10/13/2018   Zoster Recombinant(Shingrix) 05/04/2018, 12/19/2018    Health Maintenance  Topic Date Due   COVID-19 Vaccine (4 - 2024-25 season) 11/11/2022   Medicare Annual Wellness (AWV)  04/09/2024   Colonoscopy  10/12/2026   DTaP/Tdap/Td (3 - Td or Tdap) 10/12/2028   Pneumonia Vaccine 19+ Years old  Completed   INFLUENZA VACCINE  Completed   Hepatitis C Screening  Completed   Zoster Vaccines- Shingrix  Completed   HPV VACCINES  Aged Out    Discussed health benefits of physical  activity, and encouraged him to engage in regular exercise appropriate for his age and condition.  - PSA Total (Reflex To Free)   3. Primary hypertension Well controlled.  Having a bit of edema, likely secondary to amlodipine  which is tolerable to him.   4. Hyperlipidemia, unspecified hyperlipidemia type He is tolerating simvastatin  well with no adverse effects.   - CBC - Comprehensive metabolic panel - Lipid panel - TSH  5. Elevated serum glucose  - Hemoglobin A1c  6. Atherosclerosis of native coronary artery of native heart without angina pectoris Asymptomatic. Compliant with medication.  Continue aggressive risk factor modification.  Continue routine follow up cardiology.        Nancyann Perry, MD  [facility] [phone] (phone) [phone] (fax)  [facility]

## 2023-04-22 DIAGNOSIS — E785 Hyperlipidemia, unspecified: Secondary | ICD-10-CM | POA: Diagnosis not present

## 2023-04-22 DIAGNOSIS — R739 Hyperglycemia, unspecified: Secondary | ICD-10-CM | POA: Diagnosis not present

## 2023-04-23 ENCOUNTER — Encounter: Payer: Self-pay | Admitting: Family Medicine

## 2023-04-23 LAB — COMPREHENSIVE METABOLIC PANEL
ALT: 16 [IU]/L (ref 0–44)
AST: 20 [IU]/L (ref 0–40)
Albumin: 4.2 g/dL (ref 3.8–4.8)
Alkaline Phosphatase: 86 [IU]/L (ref 44–121)
BUN/Creatinine Ratio: 16 (ref 10–24)
BUN: 20 mg/dL (ref 8–27)
Bilirubin Total: 0.6 mg/dL (ref 0.0–1.2)
CO2: 22 mmol/L (ref 20–29)
Calcium: 9 mg/dL (ref 8.6–10.2)
Chloride: 107 mmol/L — ABNORMAL HIGH (ref 96–106)
Creatinine, Ser: 1.27 mg/dL (ref 0.76–1.27)
Globulin, Total: 2 g/dL (ref 1.5–4.5)
Glucose: 96 mg/dL (ref 70–99)
Potassium: 4.5 mmol/L (ref 3.5–5.2)
Sodium: 143 mmol/L (ref 134–144)
Total Protein: 6.2 g/dL (ref 6.0–8.5)
eGFR: 59 mL/min/{1.73_m2} — ABNORMAL LOW (ref >59)

## 2023-04-23 LAB — CBC
Hematocrit: 41.5 % (ref 37.5–51.0)
Hemoglobin: 14.1 g/dL (ref 13.0–17.7)
MCH: 33.3 pg — ABNORMAL HIGH (ref 26.6–33.0)
MCHC: 34 g/dL (ref 31.5–35.7)
MCV: 98 fL — ABNORMAL HIGH (ref 79–97)
Platelets: 182 10*3/uL (ref 150–450)
RBC: 4.24 x10E6/uL (ref 4.14–5.80)
RDW: 13.2 % (ref 11.6–15.4)
WBC: 5.2 10*3/uL (ref 3.4–10.8)

## 2023-04-23 LAB — LIPID PANEL
Chol/HDL Ratio: 2.3 {ratio} (ref 0.0–5.0)
Cholesterol, Total: 124 mg/dL (ref 100–199)
HDL: 55 mg/dL (ref >39)
LDL Chol Calc (NIH): 54 mg/dL (ref 0–99)
Triglycerides: 75 mg/dL (ref 0–149)
VLDL Cholesterol Cal: 15 mg/dL (ref 5–40)

## 2023-04-23 LAB — TSH: TSH: 4.93 u[IU]/mL — ABNORMAL HIGH (ref 0.450–4.500)

## 2023-04-23 LAB — PSA TOTAL (REFLEX TO FREE): Prostate Specific Ag, Serum: 0.8 ng/mL (ref 0.0–4.0)

## 2023-04-23 LAB — HEMOGLOBIN A1C
Est. average glucose Bld gHb Est-mCnc: 108 mg/dL
Hgb A1c MFr Bld: 5.4 % (ref 4.8–5.6)

## 2023-05-03 ENCOUNTER — Other Ambulatory Visit: Payer: Self-pay | Admitting: Cardiovascular Disease

## 2023-06-02 ENCOUNTER — Other Ambulatory Visit: Payer: Self-pay | Admitting: Family Medicine

## 2023-06-02 DIAGNOSIS — N401 Enlarged prostate with lower urinary tract symptoms: Secondary | ICD-10-CM

## 2023-06-04 NOTE — Telephone Encounter (Signed)
 Requested Prescriptions  Pending Prescriptions Disp Refills   tamsulosin (FLOMAX) 0.4 MG CAPS capsule [Pharmacy Med Name: TAMSULOSIN HCL 0.4 MG CAPSULE] 90 capsule 0    Sig: TAKE 1 CAPSULE BY MOUTH EVERY EVENING     Urology: Alpha-Adrenergic Blocker Passed - 06/04/2023  8:03 AM      Passed - PSA in normal range and within 360 days    PSA  Date Value Ref Range Status  01/25/2017 0.3 < OR = 4.0 ng/mL Final    Comment:    The total PSA value from this assay system is  standardized against the WHO standard. The test  result will be approximately 20% lower when compared  to the equimolar-standardized total PSA (Beckman  Coulter). Comparison of serial PSA results should be  interpreted with this fact in mind. . This test was performed using the Siemens  chemiluminescent method. Values obtained from  different assay methods cannot be used interchangeably. PSA levels, regardless of value, should not be interpreted as absolute evidence of the presence or absence of disease.    Prostate Specific Ag, Serum  Date Value Ref Range Status  04/22/2023 0.8 0.0 - 4.0 ng/mL Final    Comment:    Roche ECLIA methodology. According to the American Urological Association, Serum PSA should decrease and remain at undetectable levels after radical prostatectomy. The AUA defines biochemical recurrence as an initial PSA value 0.2 ng/mL or greater followed by a subsequent confirmatory PSA value 0.2 ng/mL or greater. Values obtained with different assay methods or kits cannot be used interchangeably. Results cannot be interpreted as absolute evidence of the presence or absence of malignant disease.          Passed - Last BP in normal range    BP Readings from Last 1 Encounters:  04/19/23 136/75         Passed - Valid encounter within last 12 months    Recent Outpatient Visits           1 month ago Rash   [place] [facility] [place], [place], PA-C   1 year ago Acute flank pain    [place] [facility] Merita Norton T, FNP   2 years ago Primary hypertension   [place] [facility] Malva Limes, MD   2 years ago Annual physical exam   [facility] Malva Limes, MD   3 years ago Sinus congestion   [facility] [place], Jodell [place], [place]

## 2023-07-15 DIAGNOSIS — M25562 Pain in left knee: Secondary | ICD-10-CM | POA: Diagnosis not present

## 2023-07-15 DIAGNOSIS — S76312A Strain of muscle, fascia and tendon of the posterior muscle group at thigh level, left thigh, initial encounter: Secondary | ICD-10-CM | POA: Diagnosis not present

## 2023-08-07 DIAGNOSIS — M7122 Synovial cyst of popliteal space [Baker], left knee: Secondary | ICD-10-CM | POA: Diagnosis not present

## 2023-08-07 DIAGNOSIS — M7061 Trochanteric bursitis, right hip: Secondary | ICD-10-CM | POA: Diagnosis not present

## 2023-08-07 DIAGNOSIS — M1712 Unilateral primary osteoarthritis, left knee: Secondary | ICD-10-CM | POA: Diagnosis not present

## 2023-08-30 DIAGNOSIS — M25551 Pain in right hip: Secondary | ICD-10-CM | POA: Diagnosis not present

## 2023-08-30 DIAGNOSIS — M7061 Trochanteric bursitis, right hip: Secondary | ICD-10-CM | POA: Diagnosis not present

## 2023-08-30 DIAGNOSIS — M1611 Unilateral primary osteoarthritis, right hip: Secondary | ICD-10-CM | POA: Diagnosis not present

## 2023-09-03 ENCOUNTER — Other Ambulatory Visit: Payer: Self-pay | Admitting: Orthopedic Surgery

## 2023-09-03 DIAGNOSIS — M25551 Pain in right hip: Secondary | ICD-10-CM

## 2023-09-03 DIAGNOSIS — M7061 Trochanteric bursitis, right hip: Secondary | ICD-10-CM

## 2023-09-03 DIAGNOSIS — M1611 Unilateral primary osteoarthritis, right hip: Secondary | ICD-10-CM

## 2023-09-19 ENCOUNTER — Ambulatory Visit
Admission: RE | Admit: 2023-09-19 | Discharge: 2023-09-19 | Disposition: A | Discharge status: Home / Self Care | Source: Ambulatory Visit | Attending: Orthopedic Surgery | Admitting: Orthopedic Surgery

## 2023-09-19 DIAGNOSIS — M1611 Unilateral primary osteoarthritis, right hip: Secondary | ICD-10-CM | POA: Diagnosis not present

## 2023-09-19 DIAGNOSIS — M25551 Pain in right hip: Secondary | ICD-10-CM | POA: Diagnosis not present

## 2023-09-19 DIAGNOSIS — S76011A Strain of muscle, fascia and tendon of right hip, initial encounter: Secondary | ICD-10-CM | POA: Diagnosis not present

## 2023-09-19 DIAGNOSIS — M7061 Trochanteric bursitis, right hip: Secondary | ICD-10-CM | POA: Insufficient documentation

## 2023-09-19 DIAGNOSIS — M25451 Effusion, right hip: Secondary | ICD-10-CM | POA: Diagnosis not present

## 2023-09-19 DIAGNOSIS — S73191A Other sprain of right hip, initial encounter: Secondary | ICD-10-CM | POA: Diagnosis not present

## 2023-09-23 ENCOUNTER — Telehealth: Payer: Self-pay | Admitting: Cardiovascular Disease

## 2023-09-23 MED ORDER — LOSARTAN POTASSIUM 50 MG PO TABS
ORAL_TABLET | ORAL | 2 refills | Status: AC
Start: 2023-09-23 — End: ?

## 2023-09-23 NOTE — Telephone Encounter (Signed)
*  STAT* If patient is at the pharmacy, call can be transferred to refill team.   1. Which medications need to be refilled? (please list name of each medication and dose if known)   losartan  (COZAAR ) 50 MG tablet     4. Which pharmacy/location (including street and city if local pharmacy) is medication to be sent to? [id number] - [place], [place] - [address] [facility]     5. Do they need a 30 day or 90 day supply? 90

## 2023-09-23 NOTE — Telephone Encounter (Signed)
 RX sent to requested Pharmacy

## 2023-10-04 DIAGNOSIS — M25551 Pain in right hip: Secondary | ICD-10-CM | POA: Diagnosis not present

## 2023-10-04 DIAGNOSIS — S76011D Strain of muscle, fascia and tendon of right hip, subsequent encounter: Secondary | ICD-10-CM | POA: Diagnosis not present

## 2023-10-04 DIAGNOSIS — M1611 Unilateral primary osteoarthritis, right hip: Secondary | ICD-10-CM | POA: Diagnosis not present

## 2023-10-22 DIAGNOSIS — M51362 Other intervertebral disc degeneration, lumbar region with discogenic back pain and lower extremity pain: Secondary | ICD-10-CM | POA: Diagnosis not present

## 2023-10-22 DIAGNOSIS — M5441 Lumbago with sciatica, right side: Secondary | ICD-10-CM | POA: Diagnosis not present

## 2023-10-22 DIAGNOSIS — G8929 Other chronic pain: Secondary | ICD-10-CM | POA: Diagnosis not present

## 2023-10-22 DIAGNOSIS — M7061 Trochanteric bursitis, right hip: Secondary | ICD-10-CM | POA: Diagnosis not present

## 2023-12-31 DIAGNOSIS — M51362 Other intervertebral disc degeneration, lumbar region with discogenic back pain and lower extremity pain: Secondary | ICD-10-CM | POA: Diagnosis not present

## 2023-12-31 DIAGNOSIS — G8929 Other chronic pain: Secondary | ICD-10-CM | POA: Diagnosis not present

## 2023-12-31 DIAGNOSIS — M5441 Lumbago with sciatica, right side: Secondary | ICD-10-CM | POA: Diagnosis not present

## 2024-01-02 ENCOUNTER — Other Ambulatory Visit: Payer: Self-pay | Admitting: Orthopedic Surgery

## 2024-01-02 DIAGNOSIS — G8929 Other chronic pain: Secondary | ICD-10-CM

## 2024-01-02 DIAGNOSIS — M51362 Other intervertebral disc degeneration, lumbar region with discogenic back pain and lower extremity pain: Secondary | ICD-10-CM

## 2024-01-07 ENCOUNTER — Ambulatory Visit
Admission: RE | Admit: 2024-01-07 | Discharge: 2024-01-07 | Disposition: A | Discharge status: Home / Self Care | Source: Ambulatory Visit | Attending: Orthopedic Surgery | Admitting: Orthopedic Surgery

## 2024-01-07 DIAGNOSIS — M5441 Lumbago with sciatica, right side: Secondary | ICD-10-CM | POA: Insufficient documentation

## 2024-01-07 DIAGNOSIS — M51362 Other intervertebral disc degeneration, lumbar region with discogenic back pain and lower extremity pain: Secondary | ICD-10-CM | POA: Insufficient documentation

## 2024-01-07 DIAGNOSIS — G8929 Other chronic pain: Secondary | ICD-10-CM | POA: Diagnosis present

## 2024-01-22 ENCOUNTER — Other Ambulatory Visit: Payer: Self-pay | Admitting: Cardiovascular Disease

## 2024-02-15 ENCOUNTER — Other Ambulatory Visit: Payer: Self-pay | Admitting: Family Medicine

## 2024-02-15 DIAGNOSIS — N401 Enlarged prostate with lower urinary tract symptoms: Secondary | ICD-10-CM

## 2024-02-17 ENCOUNTER — Other Ambulatory Visit: Payer: Self-pay

## 2024-02-17 DIAGNOSIS — I10 Essential (primary) hypertension: Secondary | ICD-10-CM

## 2024-02-17 DIAGNOSIS — I2510 Atherosclerotic heart disease of native coronary artery without angina pectoris: Secondary | ICD-10-CM

## 2024-02-20 MED ORDER — CARVEDILOL 6.25 MG PO TABS: 6.2500 mg | ORAL_TABLET | Freq: Two times a day (BID) | ORAL | 3 refills | Status: AC

## 2024-02-25 ENCOUNTER — Encounter: Payer: Self-pay | Admitting: Cardiovascular Disease

## 2024-02-25 ENCOUNTER — Ambulatory Visit: Admitting: Cardiovascular Disease

## 2024-02-25 VITALS — BP 110/54 | HR 57 | Ht 73.0 in | Wt 226.0 lb

## 2024-02-25 DIAGNOSIS — I2510 Atherosclerotic heart disease of native coronary artery without angina pectoris: Secondary | ICD-10-CM

## 2024-02-25 DIAGNOSIS — E785 Hyperlipidemia, unspecified: Secondary | ICD-10-CM

## 2024-02-25 DIAGNOSIS — I10 Essential (primary) hypertension: Secondary | ICD-10-CM

## 2024-02-25 NOTE — Progress Notes (Signed)
 Chief Complaint  Patient presents with   Follow-up    CAD   History of Present Illness: 73 yo male with history of CAD and hyperlipidemia here today for follow up. In April 2010 he had a non-ST elevation MI treated with drug eluting stent placement in the LAD. His ejection fraction was initially 40% but this improved to 60%. He had LE edema with Norvasc . He did not tolerate high dose statin therapy. Zocor  lowered to 20 mg per day due to muscle aches. He has tolerated the 20 mg daily dose. Echo December 2022 with LVEF=55-60%, trivial MR, mild AI. Cardiac MRI January 2023 with no evidence of thoracic aortic aneurysm.   He is here today for follow up. The patient denies any chest pain, dyspnea, palpitations, lower extremity edema, orthopnea, PND, dizziness, near syncope or syncope. He hurt his hip playing pickleball. He has some back pain from lumbar stenosis.   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    Primary Care Physician: Gasper Nancyann BRAVO, MD  Past Medical History:  Diagnosis Date   Arthritis    Chronic kidney disease, stage 3a (HCC)    Coronary artery disease    a. s/p NSTEMI with DES to LAD 06/2008.   First degree AV block    HTN (hypertension)    Hyperlipidemia    Kidney stones    LV dysfunction    a. EF 40% at time of cath 06/2008, improved to normal on f/u echo.   Myocardial infarction (HCC) 06/16/2008   Sinus bradycardia     Past Surgical History:  Procedure Laterality Date   CAROTID STENT  2010   CATARACT EXTRACTION, BILATERAL     COLONOSCOPY N/A 10/12/2019   Procedure: COLONOSCOPY;  Surgeon: Maryruth Ole DASEN, MD;  Location: [facility] ENDOSCOPY;  Service: Endoscopy;  Laterality: N/A;   EYE SURGERY     FRACTURE SURGERY  10/2018   I & D EXTREMITY Right 10/13/2018   Procedure: IRRIGATION AND DEBRIDEMENT RIGHT HAND;  Surgeon: Murrell Drivers, MD;  Location: [facility] OR;  Service: Orthopedics;  Laterality: Right;   INTRAOCULAR LENS INSERTION Right 10/22/2016   [facility]     Current Outpatient Medications  Medication Sig Dispense Refill   amLODipine  (NORVASC ) 10 MG tablet TAKE 1 TABLET BY MOUTH EVERY DAY 90 tablet 0   aspirin  81 MG tablet Take 1 tablet (81 mg total) by mouth daily. 30 tablet 6   carvedilol  (COREG ) 6.25 MG tablet Take 1 tablet (6.25 mg total) by mouth 2 (two) times daily with a meal. 180 tablet 3   famotidine  (PEPCID ) 20 MG tablet TAKE 1 TABLET BY MOUTH TWICE A DAY 180 tablet 3   losartan  (COZAAR ) 50 MG tablet Take 2 tablet by mouth daily in the morning. 180 tablet 2   nitroGLYCERIN  (NITROSTAT ) 0.4 MG SL tablet Place 1 tablet (0.4 mg total) under the tongue every 5 (five) minutes as needed for chest pain. 25 tablet 3   simvastatin  (ZOCOR ) 20 MG tablet TAKE 1 TABLET BY MOUTH DAILY AT 6 PM. PT NEEDS TO KEEP UPCOMING APPT IN DEC FOR FURTHER REFILLS 90 tablet 3   tamsulosin  (FLOMAX ) 0.4 MG CAPS capsule TAKE 1 CAPSULE BY MOUTH EVERY DAY IN THE EVENING 90 capsule 0   No current facility-administered medications for this visit.    No Known Allergies  Social History   Socioeconomic History   Marital status: Married    Spouse name: Science Writer   Number of children: 2   Years of education:  Not on file   Highest education level: 12th grade  Occupational History   Occupation: MAINTANCE SERVICE    Comment: retired    Comment: Owns store / music therapist  Tobacco Use   Smoking status: Never   Smokeless tobacco: Never  Vaping Use   Vaping status: Never Used  Substance and Sexual Activity   Alcohol use: Yes    Alcohol/week: 1.0 - 2.0 standard drink of alcohol    Types: 1 - 2 Shots of liquor per week   Drug use: No   Sexual activity: Not on file  Other Topics Concern   Not on file  Social History Narrative   He lives in [place] with his wife.2 children   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   Occasional alcohol   No drugs   Social Drivers of Health   Tobacco Use: Low Risk (02/25/2024)   Patient History    Smoking  Tobacco Use: Never    Smokeless Tobacco Use: Never    Passive Exposure: Not on file  Financial Resource Strain: Low Risk  (01/14/2024)   Received from [facility]   Overall Financial Resource Strain (CARDIA)    Difficulty of Paying Living Expenses: Not hard at all  Food Insecurity: No Food Insecurity (01/14/2024)   Received from [facility]   Epic    Within the past 12 months, you worried that your food would run out before you got the money to buy more.: Never true    Within the past 12 months, the food you bought just didn't last and you didn't have money to get more.: Never true  Transportation Needs: No Transportation Needs (01/14/2024)   Received from [facility] - Transportation    In the past 12 months, has lack of transportation kept you from medical appointments or from getting medications?: No    Lack of Transportation (Non-Medical): No  Physical Activity: Sufficiently Active (04/10/2023)   Exercise Vital Sign    Days of Exercise per Week: 7 days    Minutes of Exercise per Session: 40 min  Stress: No Stress Concern Present (04/10/2023)   [employer] of [employer]ational [employer] - Occupational Stress Questionnaire    Feeling of Stress : Not at all  Social Connections: Moderately Integrated (04/10/2023)   Social Connection and Isolation Panel    Frequency of Communication with Friends and Family: Once a week    Frequency of Social Gatherings with Friends and Family: More than three times a week    Attends Religious Services: 1 to 4 times per year    Active Member of [employer] or Organizations: No    Attends Banker Meetings: Never    Marital Status: Married  Recent Concern: Social Connections - Moderately Isolated (04/04/2023)   Social Connection and Isolation Panel    Frequency of Communication with Friends and Family: Once a week    Frequency of Social Gatherings with Friends and Family: Once a week     Attends Religious Services: 1 to 4 times per year    Active Member of [employer] or Organizations: No    Attends Banker Meetings: Not on file    Marital Status: Married  Catering Manager Violence: Not At Risk (04/10/2023)   Humiliation, Afraid, Rape, and Kick questionnaire    Fear of Current or Ex-Partner: No    Emotionally Abused: No    Physically Abused: No    Sexually Abused:  No  Depression (PHQ2-9): Low Risk (04/10/2023)   Depression (PHQ2-9)    PHQ-2 Score: 0  Alcohol Screen: Low Risk (04/04/2023)   Alcohol Screen    Last Alcohol Screening Score (AUDIT): 2  Housing: Low Risk  (01/14/2024)   Received from [facility]   Epic    At any time in the past 12 months, were you homeless or living in a shelter (including now)?: No    In the past 12 months, how many times have you moved where you were living?: 0    In the last 12 months, was there a time when you were not able to pay the mortgage or rent on time?: No  Utilities: Not At Risk (01/14/2024)   Received from [facility]   Epic    In the past 12 months has the electric, gas, oil, or water company threatened to shut off services in your home?: No  Health Literacy: Adequate Health Literacy (04/10/2023)   B1300 Health Literacy    Frequency of need for help with medical instructions: Never    Family History  Problem Relation Age of Onset   Diabetes Mother    Heart attack Father 46   Diabetes Brother        over weight    Review of Systems:  As stated in the HPI and otherwise negative.   BP (!) 110/54   Pulse (!) 57   Ht 6' 1 (1.854 m)   Wt 226 lb (102.5 kg)   SpO2 99%   BMI 29.82 kg/m   Physical Examination: General: Well developed, well nourished, NAD  HEENT: OP clear, mucus membranes moist  SKIN: warm, dry. No rashes. Neuro: No focal deficits  Musculoskeletal: Muscle strength 5/5 all ext  Psychiatric: Mood and affect normal  Neck: No JVD, no carotid bruits, no  thyromegaly, no lymphadenopathy.  Lungs:Clear bilaterally, no wheezes, rhonci, crackles Cardiovascular: Regular rate and rhythm. No murmurs, gallops or rubs. Abdomen:Soft. Bowel sounds present. Non-tender.  Extremities: No lower extremity edema. Pulses are 2 + in the bilateral DP/PT.  EKG:  EKG is ordered today. The ekg ordered today demonstrates  EKG Interpretation Date/Time:  Tuesday February 25 2024 09:48:18 EST Ventricular Rate:  58 PR Interval:  204 QRS Duration:  88 QT Interval:  428 QTC Calculation: 420 R Axis:   35  Text Interpretation: Sinus bradycardia Poor R wave progression Confirmed by Verlin Bruckner [phone]) on 02/25/2024 9:54:44 AM   Recent Labs: 04/22/2023: ALT 16; BUN 20; Creatinine, Ser 1.27; Hemoglobin 14.1; Platelets 182; Potassium 4.5; Sodium 143; TSH 4.930   Lipid Panel    Component Value Date/Time   CHOL 124 04/22/2023 0805   TRIG 75 04/22/2023 0805   HDL 55 04/22/2023 0805   CHOLHDL 2.3 04/22/2023 0805   CHOLHDL 2.3 01/25/2017 0801   VLDL 29 12/14/2015 0827   LDLCALC 54 04/22/2023 0805   LDLCALC 50 01/25/2017 0801     Wt Readings from Last 3 Encounters:  02/25/24 226 lb (102.5 kg)  04/19/23 214 lb 6.4 oz (97.3 kg)  04/10/23 205 lb (93 kg)    Assessment and Plan:   1. CAD without angina: No chest pain. EKG without ischemic changes. Cardiac exam is normal.  -Continue ASA, Zocor  and Coreg .         2. HYPERLIPIDEMIA: Lipids followed in primary care. LDL 54 in February 2025.  -Continue Zocor     3. HTN: BP is well controlled.  -Continue Coreg , Norvasc  and Cozaar .  Labs/ tests ordered today include:  Orders Placed This Encounter  Procedures   EKG 12-Lead   Disposition:   F/U with me in 12  months  Signed, Lonni Cash, MD 02/25/2024 10:14 AM    [facility] [address] [place], [place], [place]  [address] Phone: [phone]; Fax: [phone]

## 2024-02-25 NOTE — Patient Instructions (Signed)

## 2024-03-24 ENCOUNTER — Other Ambulatory Visit: Payer: Self-pay | Admitting: Cardiovascular Disease

## 2024-03-25 ENCOUNTER — Other Ambulatory Visit: Payer: Self-pay | Admitting: Family Medicine

## 2024-03-25 DIAGNOSIS — M5416 Radiculopathy, lumbar region: Secondary | ICD-10-CM | POA: Insufficient documentation

## 2024-03-25 DIAGNOSIS — M47819 Spondylosis without myelopathy or radiculopathy, site unspecified: Secondary | ICD-10-CM | POA: Insufficient documentation

## 2024-03-25 MED ORDER — FAMOTIDINE 20 MG PO TABS: 20.0000 mg | ORAL_TABLET | Freq: Two times a day (BID) | ORAL | 3 refills | Status: AC

## 2024-03-25 NOTE — Progress Notes (Signed)
 Fax from [place]. physiatry needs updated insurance referral to see Dr. Avanell appointment already scheduled fro 03-27-24

## 2024-04-15 ENCOUNTER — Ambulatory Visit: Payer: Medicare Other

## 2024-06-03 ENCOUNTER — Ambulatory Visit
